# Patient Record
Sex: Female | Born: 1935
Health system: Southern US, Community
[De-identification: ages and names within clinical notes are randomized; demographics above are authoritative.]

## PROBLEM LIST (undated history)

## (undated) DIAGNOSIS — H579 Unspecified disorder of eye and adnexa: Secondary | ICD-10-CM

## (undated) DIAGNOSIS — Z8669 Personal history of other diseases of the nervous system and sense organs: Secondary | ICD-10-CM

## (undated) DIAGNOSIS — K279 Peptic ulcer, site unspecified, unspecified as acute or chronic, without hemorrhage or perforation: Secondary | ICD-10-CM

## (undated) DIAGNOSIS — H3530 Unspecified macular degeneration: Secondary | ICD-10-CM

## (undated) DIAGNOSIS — K219 Gastro-esophageal reflux disease without esophagitis: Secondary | ICD-10-CM

## (undated) DIAGNOSIS — G40909 Epilepsy, unspecified, not intractable, without status epilepticus: Principal | ICD-10-CM

## (undated) DIAGNOSIS — G2581 Restless legs syndrome: Secondary | ICD-10-CM

## (undated) DIAGNOSIS — E785 Hyperlipidemia, unspecified: Secondary | ICD-10-CM

## (undated) HISTORY — DX: Unspecified disorder of eye and adnexa: H57.9

## (undated) HISTORY — DX: Restless legs syndrome: G25.81

## (undated) HISTORY — DX: Personal history of other diseases of the nervous system and sense organs: Z86.69

## (undated) HISTORY — DX: Epilepsy, unspecified, not intractable, without status epilepticus: G40.909

## (undated) HISTORY — PX: CATARACT EXTRACTION: SUR2

## (undated) HISTORY — DX: Gastro-esophageal reflux disease without esophagitis: K21.9

## (undated) HISTORY — PX: TONSILLECTOMY: SHX5217

## (undated) HISTORY — DX: Hyperlipidemia, unspecified: E78.5

## (undated) HISTORY — DX: Peptic ulcer, site unspecified, unspecified as acute or chronic, without hemorrhage or perforation: K27.9

## (undated) HISTORY — PX: TUBAL LIGATION: SHX77

## (undated) HISTORY — DX: Unspecified macular degeneration: H35.30

---

## 1978-04-25 DIAGNOSIS — K279 Peptic ulcer, site unspecified, unspecified as acute or chronic, without hemorrhage or perforation: Secondary | ICD-10-CM

## 1978-04-25 HISTORY — DX: Peptic ulcer, site unspecified, unspecified as acute or chronic, without hemorrhage or perforation: K27.9

## 1995-04-26 DIAGNOSIS — G40909 Epilepsy, unspecified, not intractable, without status epilepticus: Secondary | ICD-10-CM

## 1995-04-26 HISTORY — DX: Epilepsy, unspecified, not intractable, without status epilepticus: G40.909

## 2000-07-10 ENCOUNTER — Encounter: Payer: Self-pay | Admitting: Internal Medicine

## 2000-07-10 ENCOUNTER — Encounter: Admission: RE | Admit: 2000-07-10 | Discharge: 2000-07-10 | Payer: Self-pay | Admitting: Internal Medicine

## 2001-04-03 ENCOUNTER — Encounter: Payer: Self-pay | Admitting: Internal Medicine

## 2001-04-03 ENCOUNTER — Encounter: Admission: RE | Admit: 2001-04-03 | Discharge: 2001-04-03 | Payer: Self-pay | Admitting: Internal Medicine

## 2001-05-14 ENCOUNTER — Emergency Department (HOSPITAL_COMMUNITY): Admission: EM | Admit: 2001-05-14 | Discharge: 2001-05-14 | Payer: Self-pay | Admitting: Emergency Medicine

## 2001-12-06 ENCOUNTER — Emergency Department (HOSPITAL_COMMUNITY): Admission: EM | Admit: 2001-12-06 | Discharge: 2001-12-06 | Payer: Self-pay | Admitting: Emergency Medicine

## 2002-11-22 ENCOUNTER — Ambulatory Visit (HOSPITAL_COMMUNITY): Admission: RE | Admit: 2002-11-22 | Discharge: 2002-11-22 | Payer: Self-pay | Admitting: Gastroenterology

## 2009-02-09 ENCOUNTER — Other Ambulatory Visit: Admission: RE | Admit: 2009-02-09 | Discharge: 2009-02-09 | Payer: Self-pay | Admitting: Family Medicine

## 2010-08-16 ENCOUNTER — Institutional Professional Consult (permissible substitution) (INDEPENDENT_AMBULATORY_CARE_PROVIDER_SITE_OTHER): Payer: Medicare Other | Admitting: Family Medicine

## 2010-08-16 DIAGNOSIS — E559 Vitamin D deficiency, unspecified: Secondary | ICD-10-CM

## 2010-08-16 DIAGNOSIS — G40909 Epilepsy, unspecified, not intractable, without status epilepticus: Secondary | ICD-10-CM

## 2010-08-16 DIAGNOSIS — Z79899 Other long term (current) drug therapy: Secondary | ICD-10-CM

## 2010-08-25 ENCOUNTER — Encounter: Payer: Self-pay | Admitting: Family Medicine

## 2010-08-25 DIAGNOSIS — H9319 Tinnitus, unspecified ear: Secondary | ICD-10-CM | POA: Insufficient documentation

## 2010-09-10 NOTE — Op Note (Signed)
   NAMEARLESIA, Alicia Alicia [name]                        ACCOUNT NO.:  [id number]   MEDICAL RECORD NO.:  [id number]                   PATIENT TYPE:  AMB   LOCATION:  ENDO                                 FACILITY:  [facility]   PHYSICIAN:  James L. Malon Kindle., M.D.          DATE OF BIRTH:  Apr 19, 1934   DATE OF PROCEDURE:  11/22/2002  DATE OF DISCHARGE:                                 OPERATIVE REPORT   PROCEDURE:  Colonoscopy.   MEDICATIONS:  Fentanyl 100 mcg, Versed 8 mg IV.   SCOPE:  Olympus pediatric colonoscope.   INDICATIONS FOR PROCEDURE:  Rectal bleeding.   DESCRIPTION OF PROCEDURE:  The procedure had been explained to the patient  and consent obtained. With the patient in the left lateral decubitus  position, the Olympus scope was inserted and advanced under direct  visualization. The prep was excellent. We were able to reach the cecum  without difficulty. The ileocecal valve and appendiceal orifice were seen.  The scope was withdrawn and the cecum, ascending colon, hepatic flexure,  transverse colon, splenic flexure, descending and sigmoid colon were seen  well. No polyps seen, no significant diverticular disease.  The scope was  withdrawn down in the rectum and on retroflexed with the finding of internal  hemorrhoids. The scope was withdrawn. The patient tolerated the procedure  well.   ASSESSMENT:  Rectal bleeding probably due to internal hemorrhoids.   PLAN:  Will give Alicia [name] hemorrhoid instruction sheet and will see back in the  office on an as needed basis.                                               James L. Malon Kindle., M.D.    Waldron Session  D:  11/22/2002  T:  11/22/2002  Job:  [id number]   cc:   Sharlet Salina, M.D.  [address]  [place]  [place] [address]  Fax: [phone]

## 2010-11-08 ENCOUNTER — Other Ambulatory Visit: Payer: Self-pay | Admitting: Family Medicine

## 2011-03-21 ENCOUNTER — Other Ambulatory Visit: Payer: Self-pay | Admitting: Family Medicine

## 2011-03-21 DIAGNOSIS — G40909 Epilepsy, unspecified, not intractable, without status epilepticus: Secondary | ICD-10-CM

## 2011-03-21 NOTE — Telephone Encounter (Signed)
This was in rx request. Unsure if it was ok to refill. Please let me know. Thanks.

## 2011-03-21 NOTE — Telephone Encounter (Signed)
done

## 2011-04-29 DIAGNOSIS — L608 Other nail disorders: Secondary | ICD-10-CM | POA: Diagnosis not present

## 2011-04-29 DIAGNOSIS — B351 Tinea unguium: Secondary | ICD-10-CM | POA: Diagnosis not present

## 2011-04-29 DIAGNOSIS — M2040 Other hammer toe(s) (acquired), unspecified foot: Secondary | ICD-10-CM | POA: Diagnosis not present

## 2011-04-29 DIAGNOSIS — M79609 Pain in unspecified limb: Secondary | ICD-10-CM | POA: Diagnosis not present

## 2011-05-06 DIAGNOSIS — M79609 Pain in unspecified limb: Secondary | ICD-10-CM | POA: Diagnosis not present

## 2011-06-20 ENCOUNTER — Other Ambulatory Visit: Payer: Self-pay | Admitting: Family Medicine

## 2011-06-20 NOTE — Telephone Encounter (Signed)
This was in refill requests, I pulled chart and it stated on last note that pt gets seizure meds from neuro. Please advise.

## 2011-06-20 NOTE — Telephone Encounter (Signed)
We have refilled this for her in the past, looks like last time was in July for 6 months.  She hasn't been seen since 07/2010, so refill enough to last until appt, and make sure med check is scheduled.

## 2011-07-25 ENCOUNTER — Encounter: Payer: Self-pay | Admitting: *Deleted

## 2011-07-25 ENCOUNTER — Ambulatory Visit (INDEPENDENT_AMBULATORY_CARE_PROVIDER_SITE_OTHER): Payer: Medicare Other | Admitting: Family Medicine

## 2011-07-25 ENCOUNTER — Encounter: Payer: Self-pay | Admitting: Family Medicine

## 2011-07-25 ENCOUNTER — Other Ambulatory Visit: Payer: Self-pay | Admitting: *Deleted

## 2011-07-25 VITALS — BP 138/70 | HR 76 | Ht 62.0 in | Wt 120.0 lb

## 2011-07-25 DIAGNOSIS — H9319 Tinnitus, unspecified ear: Secondary | ICD-10-CM

## 2011-07-25 DIAGNOSIS — G40909 Epilepsy, unspecified, not intractable, without status epilepticus: Secondary | ICD-10-CM

## 2011-07-25 DIAGNOSIS — L309 Dermatitis, unspecified: Secondary | ICD-10-CM

## 2011-07-25 DIAGNOSIS — Z79899 Other long term (current) drug therapy: Secondary | ICD-10-CM

## 2011-07-25 DIAGNOSIS — Z23 Encounter for immunization: Secondary | ICD-10-CM

## 2011-07-25 DIAGNOSIS — M810 Age-related osteoporosis without current pathological fracture: Secondary | ICD-10-CM

## 2011-07-25 DIAGNOSIS — L259 Unspecified contact dermatitis, unspecified cause: Secondary | ICD-10-CM

## 2011-07-25 DIAGNOSIS — E559 Vitamin D deficiency, unspecified: Secondary | ICD-10-CM

## 2011-07-25 DIAGNOSIS — R32 Unspecified urinary incontinence: Secondary | ICD-10-CM | POA: Diagnosis not present

## 2011-07-25 LAB — POCT URINALYSIS DIPSTICK
Bilirubin, UA: NEGATIVE
Ketones, UA: NEGATIVE
pH, UA: 7

## 2011-07-25 MED ORDER — HYDROCORTISONE VALERATE 0.2 % EX CREA: TOPICAL_CREAM | Freq: Two times a day (BID) | CUTANEOUS | Status: DC

## 2011-07-25 MED ORDER — LEVETIRACETAM 500 MG PO TABS: 500.0000 mg | ORAL_TABLET | Freq: Two times a day (BID) | ORAL | Status: DC

## 2011-07-25 MED ORDER — LAMOTRIGINE 100 MG PO TABS: 100.0000 mg | ORAL_TABLET | Freq: Every day | ORAL | Status: DC

## 2011-07-25 NOTE — Patient Instructions (Signed)
Continue all of your current medications.   Please call us if you change your mind about trying any medications to help strengthen your bones, to prevent complications of osteoporosis (ie hip fracture). Continue Calcium, Vitamin D and weight-bearing exercise

## 2011-07-25 NOTE — Progress Notes (Signed)
Patient presents for a medication check.  Last OV was a year ago.  She denies any concerns or complaints.  Seizure disorder: Last seizure was probably in 1997, well over 10 years ago.  Has been on the same regimen of medications with only mild/tolerable side effects.  Occasional pain behind both knees, relieved by theragesic ointment.  Sometimes she gets some numbness in the left leg, especially if sitting too long, and at night.  Sometimes gets low back pain if doing a lot of lifting or heavy housework  Wants refill on Westcort cream--has used periodically for skin rashes in the past, and wants refill, "likes to have it on hand". Denies any rash currently.  Osteoporosis--medications have been reviewed in detail in the past, but patient isn't interested in treatment, other than doing her exercises (works out at [employer]), and taking calcium and vitamin D.  Past Medical History  Diagnosis Date  . Osteoporosis     DEXA 03/2010 T-3.2 R hip; declines meds  . Vitamin d deficiency   . GERD (gastroesophageal reflux disease)   . Tinnitus 05/2009    DR BATES--related to change in med/generic.  Resolved  . Seizure disorder 1997    evaluated by [facility] in past  . PUD (peptic ulcer disease) 1980  . Hyperlipidemia   . Restless leg syndrome   . Hx of migraines     Past Surgical History  Procedure Date  . Tonsillectomy   . Tubal ligation     History   Social History  . Marital Status: Widowed    Spouse Name: N/A    Number of Children: 2  . Years of Education: N/A   Occupational History  . retired (from Community education officer)    Social History Main Topics  . Smoking status: Never Smoker   . Smokeless tobacco: Never Used  . Alcohol Use: No  . Drug Use: No  . Sexually Active: Not on file   Other Topics Concern  . Not on file   Social History Narrative   Recently moved to one level apartment.  Widowed 2009.  1 daughter in [place], 1 daughter in [place]    Family History  Problem Relation Age  of Onset  . Heart disease Mother   . Dementia Father   . Alzheimer's disease Sister   . Diabetes Neg Hx   . Cancer Neg Hx    Current Outpatient Prescriptions on File Prior to Visit  Medication Sig Dispense Refill  . Cholecalciferol (VITAMIN D) 2000 UNITS tablet Take 4,000 Units by mouth daily.       . diphenhydramine-acetaminophen (TYLENOL PM) 25-500 MG TABS Take 1 tablet by mouth at bedtime as needed. Takes most nights      . lamoTRIgine (LAMICTAL) 100 MG tablet Take 1 tablet (100 mg total) by mouth daily.  30 tablet  11  . levETIRAcetam (KEPPRA) 500 MG tablet Take 1 tablet (500 mg total) by mouth 2 (two) times daily.  60 tablet  11     Allergies  Allergen Reactions  . Sulfa Antibiotics Other (See Comments)    unknown   ROS: Denies fevers, URI symptoms, chest pain, shortness of breath, cough, bowel changes. +gradually worsening urinary leakage. Denies stress incontinence, just with urge.  Wears a pad when she goes out.  Having some urinary frequency.  Heartburn 2-3 times/week in the evenings, relieved by over-the-counter Pepcid.    PHYSICAL EXAM: BP 138/70  Pulse 76  Ht 5\' 2"  (1.575 m)  Wt 120 lb (54.432 kg)  BMI 21.95 kg/m2 Well developed, pleasant female, appears stated age, in no distress HEENT:  PERRL, EOMI, conjunctiva clear. OP normal Neck: no lymphadenopathy, thyromegaly or carotid bruit Heart: regular rate and rhythm without murmur Lungs: clear bilaterally Back: no spine or CVA tenderness Abdomen: soft, nontender, no organomegaly or mass Extremities: no clubbing, cyanosis or edema, 2+ pulses Skin: no rashes Neuro: alert and oriented x 3, cranial nerves grossly intact.  Normal strength, sensation, gait.  DTR's 2+ and symmetric Psych: normal mood, affect, hygiene and grooming  ASSESSENT/PLAN: 1. Seizure disorder  levETIRAcetam (KEPPRA) 500 MG tablet, lamoTRIgine (LAMICTAL) 100 MG tablet  2. Osteoporosis    3. Need for Tdap vaccination  Tdap vaccine greater than or  equal to 7yo IM  4. Tinnitus    5. Urinary incontinence  Urinalysis Dipstick  6. Encounter for long-term (current) use of other medications    7. Vitamin d deficiency    8. Dermatitis  hydrocortisone valerate cream (WESTCORT) 0.2 %   Discussed treatment for osteoporosis, and risks of untreated osteoporosis.  She is worried about potential side effects of meds, but not even willing to try them.  Continue calcium, Vitamin D and weight-bearing exercise  Pneumovax UTD (5/08 per [facility] chart) Give TdaP today  Patient left without getting labs drawn--she will return later in the week.

## 2011-07-27 ENCOUNTER — Other Ambulatory Visit: Payer: Medicare Other

## 2011-07-27 DIAGNOSIS — G40909 Epilepsy, unspecified, not intractable, without status epilepticus: Secondary | ICD-10-CM

## 2011-07-27 DIAGNOSIS — M810 Age-related osteoporosis without current pathological fracture: Secondary | ICD-10-CM | POA: Diagnosis not present

## 2011-07-27 DIAGNOSIS — Z79899 Other long term (current) drug therapy: Secondary | ICD-10-CM | POA: Diagnosis not present

## 2011-07-28 ENCOUNTER — Encounter: Payer: Self-pay | Admitting: Family Medicine

## 2011-07-28 LAB — COMPREHENSIVE METABOLIC PANEL
AST: 23 U/L (ref 0–37)
BUN: 13 mg/dL (ref 6–23)
Calcium: 9.6 mg/dL (ref 8.4–10.5)
Chloride: 100 mEq/L (ref 96–112)
Creat: 0.79 mg/dL (ref 0.50–1.10)
Total Bilirubin: 0.4 mg/dL (ref 0.3–1.2)

## 2011-07-28 LAB — CBC WITH DIFFERENTIAL/PLATELET
HCT: 38.7 % (ref 36.0–46.0)
Hemoglobin: 12.2 g/dL (ref 12.0–15.0)
Lymphocytes Relative: 22 % (ref 12–46)
MCV: 87.8 fL (ref 78.0–100.0)
Monocytes Absolute: 0.3 10*3/uL (ref 0.1–1.0)
Monocytes Relative: 5 % (ref 3–12)
Neutro Abs: 4.7 10*3/uL (ref 1.7–7.7)
WBC: 6.5 10*3/uL (ref 4.0–10.5)

## 2011-07-28 LAB — VITAMIN D 25 HYDROXY (VIT D DEFICIENCY, FRACTURES): Vit D, 25-Hydroxy: 59 ng/mL (ref 30–89)

## 2011-08-02 DIAGNOSIS — M79609 Pain in unspecified limb: Secondary | ICD-10-CM | POA: Diagnosis not present

## 2011-09-20 ENCOUNTER — Telehealth: Payer: Self-pay | Admitting: Internal Medicine

## 2011-09-21 ENCOUNTER — Other Ambulatory Visit: Payer: Self-pay | Admitting: *Deleted

## 2011-09-21 DIAGNOSIS — G40909 Epilepsy, unspecified, not intractable, without status epilepticus: Secondary | ICD-10-CM

## 2011-09-21 MED ORDER — LEVETIRACETAM 500 MG PO TABS: 500.0000 mg | ORAL_TABLET | Freq: Two times a day (BID) | ORAL | Status: DC

## 2011-09-21 NOTE — Telephone Encounter (Signed)
Changed rx that was called in 07/25/11 from #[id number] with 11 refills to #180 with 3 refills per pt request.

## 2011-09-22 NOTE — Telephone Encounter (Signed)
done

## 2011-10-25 DIAGNOSIS — L820 Inflamed seborrheic keratosis: Secondary | ICD-10-CM | POA: Diagnosis not present

## 2012-01-17 DIAGNOSIS — M79609 Pain in unspecified limb: Secondary | ICD-10-CM | POA: Diagnosis not present

## 2012-01-17 DIAGNOSIS — M2040 Other hammer toe(s) (acquired), unspecified foot: Secondary | ICD-10-CM | POA: Diagnosis not present

## 2012-03-01 ENCOUNTER — Ambulatory Visit (INDEPENDENT_AMBULATORY_CARE_PROVIDER_SITE_OTHER): Payer: Medicare Other | Admitting: Family Medicine

## 2012-03-01 ENCOUNTER — Encounter: Payer: Self-pay | Admitting: Family Medicine

## 2012-03-01 VITALS — BP 118/72 | HR 72 | Ht 62.0 in | Wt 119.0 lb

## 2012-03-01 DIAGNOSIS — M810 Age-related osteoporosis without current pathological fracture: Secondary | ICD-10-CM

## 2012-03-01 DIAGNOSIS — Z23 Encounter for immunization: Secondary | ICD-10-CM

## 2012-03-01 MED ORDER — INFLUENZA VIRUS VACC SPLIT PF IM SUSP: 0.5000 mL | Freq: Once | INTRAMUSCULAR | Status: DC

## 2012-03-01 NOTE — Progress Notes (Signed)
Chief Complaint  Patient presents with  . Leg Swelling    left leg swelling and pain.   HPI:  Patient presents with complaint of pain and swelling behind both knees, left worse than right.  She has had problems with pain like this since being on her current medications, but now has gotten worse.  Also complains of her feet feeling cold a lot.  She uses theragesic cream at night behind her knees, which helps some.  During the day she doesn't really have much pain.  Has some pain in low back.  And has some leg swelling, which is improved by wearing compression stockings during the day.  She has some pain related to hammertoes--followed by a podiatrist.  Complains of feet feeling cold.    She goes to [employer] twice a week. Denies any pain with exercise.  She notices the pain and discomfort when she lies down at night.  Has known osteoporosis, but declines any treatment.  She drinks 1 gallon of milk/week  Past Medical History  Diagnosis Date  . Osteoporosis     DEXA 03/2010 T-3.2 R hip; declines meds  . Vitamin D deficiency   . GERD (gastroesophageal reflux disease)   . Tinnitus 05/2009    DR BATES--related to change in med/generic.  Resolved  . Seizure disorder 1997    evaluated by [facility] in past  . PUD (peptic ulcer disease) 1980  . Hyperlipidemia   . Restless leg syndrome   . Hx of migraines    Past Surgical History  Procedure Date  . Tonsillectomy   . Tubal ligation    History   Social History  . Marital Status: Widowed    Spouse Name: N/A    Number of Children: 2  . Years of Education: N/A   Occupational History  . retired (from Community education officer)    Social History Main Topics  . Smoking status: Never Smoker   . Smokeless tobacco: Never Used  . Alcohol Use: No  . Drug Use: No  . Sexually Active: Not on file   Other Topics Concern  . Not on file   Social History Narrative   Recently moved to one level apartment.  Widowed 2009.  1 daughter in [place], 1 daughter in  [place]   Current outpatient prescriptions:Cholecalciferol (VITAMIN D) 2000 UNITS tablet, Take 2,000 Units by mouth daily. , Disp: , Rfl: ;  diphenhydramine-acetaminophen (TYLENOL PM) 25-500 MG TABS, Take 1 tablet by mouth at bedtime as needed. Takes most nights, Disp: , Rfl: ;  hydrocortisone valerate cream (WESTCORT) 0.2 %, Apply topically 2 (two) times daily. Apply twice daily to affected areas of skin as needed for itchy rash, Disp: 45 g, Rfl: 0 lamoTRIgine (LAMICTAL) 100 MG tablet, Take 1 tablet (100 mg total) by mouth daily., Disp: 30 tablet, Rfl: 11;  levETIRAcetam (KEPPRA) 500 MG tablet, Take 1 tablet (500 mg total) by mouth 2 (two) times daily., Disp: 180 tablet, Rfl: 3 Current facility-administered medications:influenza  inactive virus vaccine (FLUZONE/FLUARIX) injection 0.5 mL, 0.5 mL, Intramuscular, Once, [name], MD  Allergies  Allergen Reactions  . Sulfa Antibiotics Other (See Comments)    unknown   ROS:  Denies fevers, URI symptoms, chest pain, shortness of breath, headaches, dizziness.  Denies skin rashes.  +bruising (recently bumped into something with legs).  Denies any seizure activity, depression/anxiety.  PHYSICAL EXAM: BP 118/72  Pulse 72  Ht 5\' 2"  (1.575 m)  Wt 119 lb (53.978 kg)  BMI 21.77 kg/m2 Well developed, pleasant,  well-appearing female in no distress Extremities: slightly doughy feel to both lower legs, but no pitting edema. Fullness felt in left popliteal fossa that is not symmetric, not present on the right.  Nontender.  FROM without crepitus.  Normal knee exam  Mild hammertoes on right foot. 2+ pulses, brisk cap refill Some echymoses L anterior shin  ASSESSMENT/PLAN: 1. Need for prophylactic vaccination and inoculation against influenza  influenza  inactive virus vaccine (FLUZONE/FLUARIX) injection 0.5 mL  2. Osteoporosis     Leg pain--pain behind left knee is likely due to a small Baker's cyst.  She may also have leg cramps at night--encouraged to  drink plenty of fluids, and can try the white soap under the sheets trick (?). Try taking Aleve twice daily (okay to still take Tylenol PM if needed) for up to 10-14 days to see if that helps with the swelling behind the knee.  You may notice more bruising, but should resolve after stopping medication.  Do NOT take additional aspirin, motrin or pain relievers other than tylenol/acetaminophen while taking the Aleve.  If it bothers your stomach, then stop taking the Aleve.  Make sure to take the Aleve with food (breakfast and dinner)  reassurred regarding normal circulation in her feet.  Discussed calcium and vitamin D recommendations (not getting enough calcium).  Discussed maintaining bone (with calcium, D and exercise) versus building bone (with the therapies she declines).  Continue weight-bearing exercise.  Labs all normal 6 months ago, not repeating today  F/u in 6 months, sooner prn

## 2012-03-01 NOTE — Patient Instructions (Addendum)
Leg pain--pain behind left knee is likely due to a small Baker's cyst.  She may also have leg cramps at night--encouraged to drink plenty of fluids, and can try the white soap under the sheets trick (?). Try taking Aleve twice daily (okay to still take Tylenol PM if needed) for up to 10-14 days to see if that helps with the swelling behind the knee.  You may notice more bruising, but should resolve after stopping medication.  Do NOT take additional aspirin, motrin or pain relievers other than tylenol/acetaminophen while taking the Aleve.  If it bothers your stomach, then stop taking the Aleve.  Make sure to take the Aleve with food (breakfast and dinner)  Try and get 1200-1500 mg of Calcium from your diet and vitamins, to help maintain your bones and not lose any further bone density.  Baker's Cyst A Baker's cyst is a swelling that forms in the back of the knee. It is a sac-like structure. It is filled with the same fluid that is located in your knee. The fluid located in your knee is necessary because it lubricates the bones and cartilage. It allows them to move over each other more easily. CAUSES  When the knee becomes injured or has soreness (inflammation) present, more fluid forms in the knee. When this happens, the joint lining is pushed out behind the knee and forms the baker's cyst. This cyst may also be caused by inflammation from arthritic conditions and infections. DIAGNOSIS  A Baker's cyst is most often diagnosed with an ultrasound. This is a specialized picture (like an X-ray). It shows a picture by using sound waves. Sometimes a specialized x-ray called an MRI (magnetic resonance imaging) is used. This picks up other problems within a joint if an ultrasound alone cannot make the diagnosis. If the cyst came immediately following an injury, plain x-rays may be used to make a diagnosis. TREATMENT  The treatment depends on the cause of the cyst. But most of these cysts are caused by an  inflammation. Anti-inflammatory medications and rest often will get rid of the problem. If the cyst is caused by an infection, medications (antibiotics) will be prescribed to help this. Take the medications as directed. Refer to Home Care Instructions, below, for additional treatment suggestions. HOME CARE INSTRUCTIONS   If the cyst was caused by an injury, for the first 24 hours, while lying down, keep the injured extremity elevated on 2 pillows.  For the first 24 hours while you are awake, apply ice bags (ice in a plastic bag with a towel around it to prevent frostbite to skin) 3 to 4 times per day for 15 to 20 minutes to the injured area. Then do as directed by your caregiver.  Only take over-the-counter or prescription medicines for pain, discomfort, or fever as directed by your caregiver. Persistent pain and inability to use the injured area for more than 2 to 3 days are warning signs indicating that you should see a caregiver for a follow-up visit as soon as possible. Persistent pain and swelling indicate that further evaluation, non-weight bearing (use of crutches as instructed), and/or further x-rays are needed. Make a follow-up appointment with your own caregiver. If conservative measures (rest, medications and inactivity) do not help the problem get better, sometimes surgery for removal of the cyst is needed. Reasons for this may be that the cyst is pressing on nerves and/or vessels and causing problems which cannot wait for improvement with conservative treatment. If the problem is caused  by injuries to the cartilage in the knee, surgery is often needed for treatment of that problem. MAKE SURE YOU:   Understand these instructions.  Will watch your condition.  Will get help right away if you are not doing well or get worse. Document Released: 04/11/2005 Document Revised: 07/04/2011 Document Reviewed: 11/28/2007 [facility] Patient Information 2013 [place], [place].

## 2012-03-07 ENCOUNTER — Other Ambulatory Visit: Payer: Self-pay | Admitting: Family Medicine

## 2012-08-06 ENCOUNTER — Ambulatory Visit (INDEPENDENT_AMBULATORY_CARE_PROVIDER_SITE_OTHER): Payer: Medicare Other | Admitting: Family Medicine

## 2012-08-06 ENCOUNTER — Encounter: Payer: Self-pay | Admitting: Family Medicine

## 2012-08-06 VITALS — BP 130/86 | HR 84 | Ht 62.0 in | Wt 118.0 lb

## 2012-08-06 DIAGNOSIS — Z0000 Encounter for general adult medical examination without abnormal findings: Secondary | ICD-10-CM | POA: Diagnosis not present

## 2012-08-06 DIAGNOSIS — E559 Vitamin D deficiency, unspecified: Secondary | ICD-10-CM

## 2012-08-06 DIAGNOSIS — R300 Dysuria: Secondary | ICD-10-CM

## 2012-08-06 DIAGNOSIS — Z13220 Encounter for screening for lipoid disorders: Secondary | ICD-10-CM

## 2012-08-06 DIAGNOSIS — G40909 Epilepsy, unspecified, not intractable, without status epilepticus: Secondary | ICD-10-CM | POA: Diagnosis not present

## 2012-08-06 DIAGNOSIS — M810 Age-related osteoporosis without current pathological fracture: Secondary | ICD-10-CM

## 2012-08-06 DIAGNOSIS — Z01419 Encounter for gynecological examination (general) (routine) without abnormal findings: Secondary | ICD-10-CM | POA: Diagnosis not present

## 2012-08-06 DIAGNOSIS — Z79899 Other long term (current) drug therapy: Secondary | ICD-10-CM | POA: Diagnosis not present

## 2012-08-06 DIAGNOSIS — N390 Urinary tract infection, site not specified: Secondary | ICD-10-CM | POA: Diagnosis not present

## 2012-08-06 LAB — LIPID PANEL
Cholesterol: 203 mg/dL — ABNORMAL HIGH (ref 0–200)
Total CHOL/HDL Ratio: 2.3 Ratio

## 2012-08-06 LAB — POCT URINALYSIS DIPSTICK
Bilirubin, UA: NEGATIVE
Glucose, UA: NEGATIVE
Nitrite, UA: NEGATIVE

## 2012-08-06 LAB — COMPREHENSIVE METABOLIC PANEL
ALT: 10 U/L (ref 0–35)
AST: 16 U/L (ref 0–37)
CO2: 28 mEq/L (ref 19–32)
Calcium: 9.8 mg/dL (ref 8.4–10.5)
Chloride: 97 mEq/L (ref 96–112)
Potassium: 4.8 mEq/L (ref 3.5–5.3)
Sodium: 135 mEq/L (ref 135–145)
Total Protein: 7.3 g/dL (ref 6.0–8.3)

## 2012-08-06 MED ORDER — NITROFURANTOIN MONOHYD MACRO 100 MG PO CAPS: 100.0000 mg | ORAL_CAPSULE | Freq: Two times a day (BID) | ORAL | Status: DC

## 2012-08-06 MED ORDER — LAMOTRIGINE 100 MG PO TABS: 100.0000 mg | ORAL_TABLET | Freq: Every day | ORAL | Status: DC

## 2012-08-06 MED ORDER — LEVETIRACETAM 500 MG PO TABS: 500.0000 mg | ORAL_TABLET | Freq: Two times a day (BID) | ORAL | Status: DC

## 2012-08-06 NOTE — Progress Notes (Addendum)
Chief Complaint  Patient presents with  . Med check plus    fasting med check plus. Having some burning with urination and incontinence.   Patient is here for Med check, Annual Wellness Visit and forms to be DMV forms to be filled out.  She is complaining of burning with urination, at the end of void, for 7-10 days.  Incontinence is ongoing, somewhat worse in the last week.  No leakage with cough/sneeze, just constant dribbling, frequency.  Wears pads.  Seizure disorder: Last seizure was probably in 1998, when medications were stopped (diagnosed with seizures in 1997). Has been on the same regimen of medications with only mild/tolerable side effects. Had some problems with generic medications.  She reports her seizures were always while she was asleep, and last seizure was when she tried to come off meds.  She had eval by [facility], and last note is in old records in her chart, and was reviewed.  She has no desire to come off meds, or to take any meds that might interfere with her seizure medications, so that she can continue to drive.  Occasional pain behind both knees, last seen for this in November.  Not bothering her much, intermittent.  Osteoporosis--medications have been reviewed in detail in the past, but patient isn't interested in treatment, other than doing her exercises (works out at [employer]), and taking vitamin D.  She won't take any calcium supplements but "drinks a lot of milk".  AWV:  Other doctors caring for patient:   Sees a dentist (can't recall name) ophtho--wants a new one, doesn't remember name End of Life issues: she already has Living Will and Healthcare power of attorney. See Depression and ADL questionnaires (scanned)  Health Maintenance: Immunization History  Administered Date(s) Administered  . Influenza Split 03/01/2012  . Pneumococcal Polysaccharide 04/26/1999, 09/01/2006  . Td 09/01/2006  . Tdap 07/25/2011   Last Pap smear: >5 years ago Last mammogram:  2011 Last colonoscopy: thinks it has been 10 years. Per Dr. Milinda Pointer first note at [facility], said 2 years prior (so would have been approx 2006) Last DEXA: 03/2010 Ophtho: 2 years ago Dentist: once a year  Past Medical History  Diagnosis Date  . Osteoporosis     DEXA 03/2010 T-3.2 R hip; declines meds  . Vitamin D deficiency   . GERD (gastroesophageal reflux disease)   . Tinnitus 05/2009    DR BATES--related to change in med/generic.  Resolved  . Seizure disorder 1997    evaluated by [facility] in past  . PUD (peptic ulcer disease) 1980  . Hyperlipidemia   . Restless leg syndrome   . Hx of migraines     Past Surgical History  Procedure Laterality Date  . Tonsillectomy    . Tubal ligation      History   Social History  . Marital Status: Widowed    Spouse Name: N/A    Number of Children: 2  . Years of Education: N/A   Occupational History  . retired (from Community education officer)    Social History Main Topics  . Smoking status: Never Smoker   . Smokeless tobacco: Never Used  . Alcohol Use: No  . Drug Use: No  . Sexually Active: Not on file   Other Topics Concern  . Not on file   Social History Narrative   Recently moved to one level apartment.  Widowed 2009.  1 daughter in [place], 1 daughter in [place]    Family History  Problem Relation Age of Onset  .  Heart disease Mother   . Dementia Father   . Alzheimer's disease Sister   . Diabetes Neg Hx   . Cancer Neg Hx    Current outpatient prescriptions:Cholecalciferol (VITAMIN D) 2000 UNITS tablet, Take 2,000 Units by mouth daily. , Disp: , Rfl: ;  diphenhydramine-acetaminophen (TYLENOL PM) 25-500 MG TABS, Take 1 tablet by mouth at bedtime as needed. Takes most nights, Disp: , Rfl: ;  lamoTRIgine (LAMICTAL) 100 MG tablet, Take 1 tablet (100 mg total) by mouth daily., Disp: 30 tablet, Rfl: 11 levETIRAcetam (KEPPRA) 500 MG tablet, Take 1 tablet (500 mg total) by mouth 2 (two) times daily., Disp: 60 tablet, Rfl: 11;  hydrocortisone  valerate cream (WESTCORT) 0.2 %, APPLY TO AFFECTED AREA TWICE A DAY FOR ITCHY RASH, Disp: 45 g, Rfl: 0;  nitrofurantoin, macrocrystal-monohydrate, (MACROBID) 100 MG capsule, Take 1 capsule (100 mg total) by mouth 2 (two) times daily., Disp: 14 capsule, Rfl: 0  (macrobid just added today, not prior to today's visit)  Allergies  Allergen Reactions  . Sulfa Antibiotics Other (See Comments)    unknown   ROS:  The patient denies anorexia, fever, weight changes, headaches,  Seizures, vision changes, decreased hearing, ear pain, sore throat, breast concerns, chest pain, palpitations, dizziness, syncope, dyspnea on exertion, cough, swelling, nausea, vomiting, diarrhea, constipation, abdominal pain, melena, hematochezia, indigestion/heartburn, vaginal bleeding, discharge, odor or itch, genital lesions, numbness, tingling, weakness, tremor, suspicious skin lesions, depression, anxiety, abnormal bleeding/bruising, or enlarged lymph nodes. + urinary complaints as per HPI; occasional knee pain,  Some allergies recently/congestion  PHYSICAL EXAM: BP 130/86  Pulse 84  Ht 5\' 2"  (1.575 m)  Wt 118 lb (53.524 kg)  BMI 21.58 kg/m2  General Appearance:    Alert, cooperative, no distress, appears stated age  Head:    Normocephalic, without obvious abnormality, atraumatic  Eyes:    PERRL, conjunctiva/corneas clear, EOM's intact, fundi    benign  Ears:    Normal TM's and external ear canals  Nose:   Nares normal, mucosa normal, no drainage or sinus   tenderness  Throat:   Lips, mucosa, and tongue normal; teeth and gums normal  Neck:   Supple, no lymphadenopathy;  thyroid:  no   enlargement/tenderness/nodules; no carotid   bruit or JVD  Back:    Spine nontender, no curvature, ROM normal, no CVA     tenderness  Lungs:     Clear to auscultation bilaterally without wheezes, rales or     ronchi; respirations unlabored  Chest Wall:    No tenderness or deformity   Heart:    Regular rate and rhythm, S1 and S2 normal,  no murmur, rub   or gallop  Breast Exam:    No tenderness, masses, or nipple discharge or inversion.      No axillary lymphadenopathy  Abdomen:     Soft, non-tender, nondistended, normoactive bowel sounds,    no masses, no hepatosplenomegaly  Genitalia:    Normal external genitalia without lesions.  BUS and vagina normal; she is tender anteriorly over her bladder on pelvic exam.  No prolapse.  Also slightly tender at urethra. No uterine enlargement, adnexal masses or tenderness  Rectal:    Normal tone, no masses or tenderness; guaiac negative stool  Extremities:   No clubbing, cyanosis or edema  Pulses:   2+ and symmetric all extremities  Skin:   Skin color, texture, turgor normal, no rashes or lesions  Lymph nodes:   Cervical, supraclavicular, and axillary nodes normal  Neurologic:  CNII-XII intact, normal strength, sensation and gait; reflexes 2+ and symmetric throughout          Psych:   Normal mood, affect, hygiene and grooming.    ASSESSMENT/PLAN:  Seizure disorder - Plan: lamoTRIgine (LAMICTAL) 100 MG tablet, levETIRAcetam (KEPPRA) 500 MG tablet  Urinary tract infection, site not specified - treat with macrobid.  urine culture sent.  call/return if symptoms persist/worsen - Plan: Urine culture, nitrofurantoin, macrocrystal-monohydrate, (MACROBID) 100 MG capsule  Osteoporosis - pt refuses treatment. Risks of untreated osteoporosis reviewed.  reviewed dietary calcium sources (refuses pills); continue Vitamin D  Vitamin D deficiency  Burning with urination - Plan: POCT Urinalysis Dipstick  Encounter for long-term (current) use of other medications - Plan: CBC with Differential, Comprehensive metabolic panel, Lipid panel, TSH  Screening for lipoid disorders - Plan: Lipid panel  Encounter for Medicare annual wellness exam  Seizure disorder--stable.  DMV forms filled out, okay to drive.  Continue current medications.  Osteoporosis--no need to recheck DEXA if unwilling to  treat.  Check with [facility] to see if they did last colonoscopy, and when it was, to see if due now (recommended every 10 years, may have been 2006 per old records, but no reports in chart)  Willing to schedule mammogram (last had 2011), encouraged for her to have yearly.  Shingles vaccine recommended and discussed; needs to get at pharmacy.  AWV:  Reviewed benefits/procedures covered by [facility] copy of form given to patient (scanned).  She has living will and POA.

## 2012-08-06 NOTE — Patient Instructions (Addendum)
HEALTH MAINTENANCE RECOMMENDATIONS:  It is recommended that you get at least 30 minutes of aerobic exercise at least 5 days/week (for weight loss, you may need as much as 60-90 minutes). This can be any activity that gets your heart rate up. This can be divided in 10-15 minute intervals if needed, but try and build up your endurance at least once a week.  Weight bearing exercise is also recommended twice weekly.  Eat a healthy diet with lots of vegetables, fruits and fiber.  "Colorful" foods have a lot of vitamins (ie green vegetables, tomatoes, red peppers, etc).  Limit sweet tea, regular sodas and alcoholic beverages, all of which has a lot of calories and sugar.  Up to 1 alcoholic drink daily may be beneficial for women (unless trying to lose weight, watch sugars).  Drink a lot of water.  Calcium recommendations are 1200-1500 mg daily (1500 mg for postmenopausal women or women without ovaries), and vitamin D 1000 IU daily.  This should be obtained from diet and/or supplements (vitamins), and calcium should not be taken all at once, but in divided doses.  Monthly self breast exams and yearly mammograms for women over the age of 62 is recommended.  Sunscreen of at least SPF 30 should be used on all sun-exposed parts of the skin when outside between the hours of 10 am and 4 pm (not just when at beach or pool, but even with exercise, golf, tennis, and yard work!)  Use a sunscreen that says "broad spectrum" so it covers both UVA and UVB rays, and make sure to reapply every 1-2 hours.  Remember to change the batteries in your smoke detectors when changing your clock times in the spring and fall.  Use your seat belt every time you are in a car, and please drive safely and not be distracted with cell phones and texting while driving.  Check with [facility] to see if they did last colonoscopy, and when it was, to see if due now (recommended every 10 years)  Return in fall for flu shot. I recommend  Shingles vaccine--check with your insurance regarding cost/coverage. You will need to get this from a pharmacy (not my office).  Take antibiotics as prescribed for your bladder infection.  Call us in 3-4 days if your symptoms aren't improving.

## 2012-08-07 LAB — CBC WITH DIFFERENTIAL/PLATELET
Basophils Absolute: 0 10*3/uL (ref 0.0–0.1)
Lymphocytes Relative: 18 % (ref 12–46)
Lymphs Abs: 1.6 10*3/uL (ref 0.7–4.0)
MCV: 83.5 fL (ref 78.0–100.0)
Neutro Abs: 6.4 10*3/uL (ref 1.7–7.7)
Neutrophils Relative %: 73 % (ref 43–77)
Platelets: 243 10*3/uL (ref 150–400)
RBC: 4.79 MIL/uL (ref 3.87–5.11)
RDW: 14.6 % (ref 11.5–15.5)
WBC: 8.7 10*3/uL (ref 4.0–10.5)

## 2012-08-08 LAB — URINE CULTURE: Colony Count: >=100000

## 2012-08-08 LAB — TSH: TSH: 0.85 u[IU]/mL (ref 0.350–4.500)

## 2012-08-14 ENCOUNTER — Telehealth: Payer: Self-pay | Admitting: Family Medicine

## 2012-08-14 NOTE — Telephone Encounter (Signed)
Pt called and stated that dmv stated forms had not been received. Pt was informed that out records indicate forms faxed 08/06/2012. Pt requested that forms be fax to a different fax number. Forms were faxed to [phone] per pt instructions.

## 2012-08-27 DIAGNOSIS — H2510 Age-related nuclear cataract, unspecified eye: Secondary | ICD-10-CM | POA: Diagnosis not present

## 2012-08-27 DIAGNOSIS — H353190 Nonexudative age-related macular degeneration, unspecified eye, stage unspecified: Secondary | ICD-10-CM | POA: Diagnosis not present

## 2012-10-19 DIAGNOSIS — M48061 Spinal stenosis, lumbar region without neurogenic claudication: Secondary | ICD-10-CM | POA: Diagnosis not present

## 2012-11-19 DIAGNOSIS — M778 Other enthesopathies, not elsewhere classified: Secondary | ICD-10-CM | POA: Diagnosis not present

## 2012-12-26 DIAGNOSIS — H43399 Other vitreous opacities, unspecified eye: Secondary | ICD-10-CM | POA: Diagnosis not present

## 2012-12-26 DIAGNOSIS — H353190 Nonexudative age-related macular degeneration, unspecified eye, stage unspecified: Secondary | ICD-10-CM | POA: Diagnosis not present

## 2012-12-26 DIAGNOSIS — H04129 Dry eye syndrome of unspecified lacrimal gland: Secondary | ICD-10-CM | POA: Diagnosis not present

## 2012-12-26 DIAGNOSIS — H2510 Age-related nuclear cataract, unspecified eye: Secondary | ICD-10-CM | POA: Diagnosis not present

## 2012-12-26 DIAGNOSIS — H04209 Unspecified epiphora, unspecified lacrimal gland: Secondary | ICD-10-CM | POA: Diagnosis not present

## 2013-01-01 ENCOUNTER — Other Ambulatory Visit: Payer: Self-pay | Admitting: Family Medicine

## 2013-01-01 NOTE — Telephone Encounter (Signed)
Chart reviewed.  Last discussed 07/2011, last refilled 02/2012.  Uses prn rashes.  Only due for yearly visits, has appt 07/2013.  Ok to refill

## 2013-01-01 NOTE — Telephone Encounter (Signed)
IS THIS OK 

## 2013-01-03 ENCOUNTER — Encounter: Payer: Self-pay | Admitting: *Deleted

## 2013-01-15 DIAGNOSIS — Z23 Encounter for immunization: Secondary | ICD-10-CM | POA: Diagnosis not present

## 2013-04-03 DIAGNOSIS — H04129 Dry eye syndrome of unspecified lacrimal gland: Secondary | ICD-10-CM | POA: Diagnosis not present

## 2013-04-03 DIAGNOSIS — H40019 Open angle with borderline findings, low risk, unspecified eye: Secondary | ICD-10-CM | POA: Diagnosis not present

## 2013-05-30 ENCOUNTER — Other Ambulatory Visit: Payer: Self-pay | Admitting: *Deleted

## 2013-05-30 ENCOUNTER — Telehealth: Payer: Self-pay | Admitting: Family Medicine

## 2013-05-30 DIAGNOSIS — G40909 Epilepsy, unspecified, not intractable, without status epilepticus: Secondary | ICD-10-CM

## 2013-05-30 MED ORDER — LAMOTRIGINE 100 MG PO TABS: 100.0000 mg | ORAL_TABLET | Freq: Every day | ORAL | Status: DC

## 2013-05-30 NOTE — Telephone Encounter (Signed)
Done

## 2013-05-30 NOTE — Telephone Encounter (Signed)
She was given #30 x 11 refills at her last visit in April.  Looks like she is asking for a 90 day rx.  Okay for 90 day.  Has visit scheduled for April already

## 2013-05-31 NOTE — Telephone Encounter (Signed)
done

## 2013-07-22 ENCOUNTER — Other Ambulatory Visit: Payer: Self-pay | Admitting: Family Medicine

## 2013-07-24 ENCOUNTER — Ambulatory Visit (INDEPENDENT_AMBULATORY_CARE_PROVIDER_SITE_OTHER): Payer: Medicare Other | Admitting: Family Medicine

## 2013-07-24 ENCOUNTER — Encounter: Payer: Self-pay | Admitting: Family Medicine

## 2013-07-24 VITALS — BP 152/90 | HR 72 | Ht 62.0 in | Wt 118.0 lb

## 2013-07-24 DIAGNOSIS — K120 Recurrent oral aphthae: Secondary | ICD-10-CM

## 2013-07-24 DIAGNOSIS — G40909 Epilepsy, unspecified, not intractable, without status epilepticus: Secondary | ICD-10-CM

## 2013-07-24 DIAGNOSIS — K130 Diseases of lips: Secondary | ICD-10-CM | POA: Diagnosis not present

## 2013-07-24 DIAGNOSIS — R220 Localized swelling, mass and lump, head: Secondary | ICD-10-CM

## 2013-07-24 MED ORDER — LAMOTRIGINE 100 MG PO TABS: 100.0000 mg | ORAL_TABLET | Freq: Every day | ORAL | Status: DC

## 2013-07-24 NOTE — Progress Notes (Signed)
Chief Complaint  Patient presents with  . Edema    swelling of her lips and mouth since Sunday. Had a new 90 day rx sent to pharmacy of her lamictal-started a few weeks ago and had some ringing in her right ear. Stopped these pills and took some that were left over from old rx and ringing stopped. Started back on new rx this past Sat and swelling of mouth began. This manufacturer is mylan and the old rx as as well.    Patient presents with complaint of lip and mouth pain, concerned that it is related to her lamictal prescription.  Ringing in right ear recurred when she started the last bottle of lamictal, which [facility], but picked up a 90 day supply.  She previously had the same reaction when taking a generic from a different manufacturer in the past, and when she switched back to the brand, symptoms had resolved.  She had some lamictal leftover that she restarted, and ringing went away.  On Saturday she had to go back to the 90d bottle as she used up the old supply.  She didn't have any recurrent ringing in the ear since restarting, but she noticed swelling of her lips and gums the following day (3 days ago).    She used to take brand only of Lamictal, due to having had the side effect from generic in the past.  She changed to generic when she was able to get it for free through her insurance, and it also comes from Mylan (same as the brand).  She has been on this same generic (Mylan) for a number of years.  [facility] She was only offered a refill of the same medicine from the same lot--apparently they only order it for her  She denies eating any different foods. No change in toothpaste, mouthwash, cleansers or other products. She used a clinique lip gloss that night that was new.  She has used clinique products in the past, but not this particular lip gloss. The gums have gotten worse--they are hurting, sore.  Only the lips (upper and lower)  are affected, as well as the gums.  Denies tongue swelling, throat swelling no trouble swallowing or breathing.  She has been using salt water rinses.  Past Medical History  Diagnosis Date  . Osteoporosis     DEXA 03/2010 T-3.2 R hip; declines meds  . Vitamin D deficiency   . GERD (gastroesophageal reflux disease)   . Tinnitus 05/2009    DR BATES--related to change in med/generic.  Resolved  . Seizure disorder 1997    evaluated by [facility] in past  . PUD (peptic ulcer disease) 1980  . Hyperlipidemia   . Restless leg syndrome   . Hx of migraines   . Macular degeneration    Past Surgical History  Procedure Laterality Date  . Tonsillectomy    . Tubal ligation     History   Social History  . Marital Status: Widowed    Spouse Name: N/A    Number of Children: 2  . Years of Education: N/A   Occupational History  . retired (from Community education officer)    Social History Main Topics  . Smoking status: Never Smoker   . Smokeless tobacco: Never Used  . Alcohol Use: No  . Drug Use: No  . Sexual Activity: Not on file   Other Topics Concern  . Not on file   Social  History Narrative   Recently moved to one level apartment.  Widowed 2009.  1 daughter in [place], 1 daughter in [place]   Outpatient Encounter Prescriptions as of 07/24/2013  Medication Sig  . Cholecalciferol (VITAMIN D) 2000 UNITS tablet Take 2,000 Units by mouth daily.   . diphenhydramine-acetaminophen (TYLENOL PM) 25-500 MG TABS Take 1 tablet by mouth at bedtime as needed. Takes most nights  . lamoTRIgine (LAMICTAL) 100 MG tablet Take 1 tablet (100 mg total) by mouth daily.  Marland Kitchen. levETIRAcetam (KEPPRA) 500 MG tablet TAKE 1 TABLET BY MOUTH TWICE A DAY *NDC [id number]*  . [DISCONTINUED] lamoTRIgine (LAMICTAL) 100 MG tablet Take 1 tablet (100 mg total) by mouth daily.  Marland Kitchen. acetaminophen (TYLENOL) 325 MG tablet Take 325 mg by mouth every 6 (six) hours as needed.  . hydrocortisone valerate cream (WESTCORT) 0.2 % APPLY TO AFFECTED AREA TWICE A  DAY FOR ITCHY RASH  . [DISCONTINUED] nitrofurantoin, macrocrystal-monohydrate, (MACROBID) 100 MG capsule Take 1 capsule (100 mg total) by mouth 2 (two) times daily.   Allergies  Allergen Reactions  . Sulfa Antibiotics Other (See Comments)    unknown   ROS:  Denies fevers, chills, URI symptoms, headaches, seizures, nausea, vomiting, skin rash, bleeding/bruising or other concerns. Denies allergy symptoms, cough, shortness of breath, chest pain  PHYSICAL EXAM: BP 152/90  Pulse 72  Ht 5\' 2"  (1.575 m)  Wt 118 lb (53.524 kg)  BMI 21.58 kg/m2 Somewhat anxious female, otherwise in no distress HEENT:  PERRL, EOMI, conjunctiva clear. Lips appear normal, without any significant swelling, flaking, rash or lesions.  OP is notable for a few scattered aphthous ulcers in upper and lower gums, more on right than left (total of 3 sores seen).  Tongue appears normal, and rest of OP is normal Neck: no lymphadenopathy, thyromegaly or mass Heart: regular rate and rhythm without murmur Lungs: clear bilaterally Skin: no rash or lesions  ASSESSMENT/PLAN:  Aphthous ulcer - oral  Seizure disorder - Plan: lamoTRIgine (LAMICTAL) 100 MG tablet  Lip swelling - mild.  had used new lip gloss, suspect contact reaction   We discussed that her lip gloss may have caused a contact dermatitis contributing to lip swelling.  Do not use the Clinique lip gloss again.  Use some claritin or zyrtec for the swelling. Use anbesol (she has at home) for the painful ulcers.  Declined magic mouthwash at this time--call if needed  I doubt that it is related to the Lamictal, as there really wasn't a change (just a different lot, same manufacturer).  She prefers to try and get Mylan lamictal from another pharmacy to try, aware that she will likely have to pay out of pocket.  Once she finishes the 2 weeks, she will retry the bottle that she has (of 90) and see if symptoms recur.

## 2013-07-24 NOTE — Patient Instructions (Signed)
Canker Sores  Canker sores are painful, open sores on the inside of the mouth and cheek. They may be white or yellow. The sores usually heal in 1 to 2 weeks. Women are more likely than men to have recurrent canker sores. CAUSES The cause of canker sores is not well understood. More than one cause is likely. Canker sores do not appear to be caused by certain types of germs (viruses or bacteria). Canker sores may be caused by:  An allergic reaction to certain foods.  Digestive problems.  Not having enough vitamin B12, folic acid, and iron.  Female sex hormones. Sores may come only during certain phases of a menstrual cycle. Often, there is improvement during pregnancy.  Genetics. Some people seem to inherit canker sore problems. Emotional stress and injuries to the mouth may trigger outbreaks, but not cause them.  DIAGNOSIS Canker sores are diagnosed by exam.  TREATMENT  Patients who have frequent bouts of canker sores may have cultures taken of the sores, blood tests, or allergy tests. This helps determine if their sores are caused by a poor diet, an allergy, or some other preventable or treatable disease.  Vitamins may prevent recurrences or reduce the severity of canker sores in people with poor nutrition.  Numbing ointments can relieve pain. These are available in drug stores without a prescription.  Anti-inflammatory steroid mouth rinses or gels may be prescribed by your caregiver for severe sores.  Oral steroids may be prescribed if you have severe, recurrent canker sores. These strong medicines can cause many side effects and should be used only under the close direction of a dentist or physician.  Mouth rinses containing the antibiotic medicine may be prescribed. They may lessen symptoms and speed healing. Healing usually happens in about 1 or 2 weeks with or without treatment. Certain antibiotic mouth rinses given to pregnant women and young children can permanently stain teeth.  Talk to your caregiver about your treatment. HOME CARE INSTRUCTIONS   Avoid foods that cause canker sores for you.  Avoid citrus juices, spicy or salty foods, and coffee until the sores are healed.  Use a soft-bristled toothbrush.  Chew your food carefully to avoid biting your cheek.  Apply topical numbing medicine to the sore to help relieve pain.  Apply a thin paste of baking soda and water to the sore to help heal the sore.  Only use mouth rinses or medicines for pain or discomfort as directed by your caregiver. SEEK MEDICAL CARE IF:   Your symptoms are not better in 1 week.  Your sores are still present after 2 weeks.  Your sores are very painful.  You have trouble breathing or swallowing.  Your sores come back frequently. Document Released: 08/06/2010 Document Revised: 08/06/2012 Document Reviewed: 08/06/2010 [facility] Patient Information 2014 [place], [place].   Take claritin or zyrtec to help with the lip swelling. Do not use the lip gloss again. Use Anbesol to the sores in your mouth. Call for prescription for Magic Mouthwash if your sores are causing more pain.  Go to the emergency room if you develop tongue or throat swelling, or any shortness of breath.  I doubt it is from the medication, but to be on the safe side, you can change to the brand for a month, and then retry your current prescription.  If you have recurrent symptoms then you will need to stay on the brand.  If you have ongoing symptoms despite being on the brand, then you might be developing an  allergy to the medication, and need to see neurologist to have medication changed

## 2013-08-07 ENCOUNTER — Ambulatory Visit (INDEPENDENT_AMBULATORY_CARE_PROVIDER_SITE_OTHER): Payer: Medicare Other | Admitting: Family Medicine

## 2013-08-07 ENCOUNTER — Encounter: Payer: Self-pay | Admitting: Family Medicine

## 2013-08-07 VITALS — BP 148/88 | HR 80 | Temp 97.9°F | Ht 62.0 in | Wt 115.0 lb

## 2013-08-07 DIAGNOSIS — M810 Age-related osteoporosis without current pathological fracture: Secondary | ICD-10-CM | POA: Diagnosis not present

## 2013-08-07 DIAGNOSIS — E559 Vitamin D deficiency, unspecified: Secondary | ICD-10-CM

## 2013-08-07 DIAGNOSIS — Z79899 Other long term (current) drug therapy: Secondary | ICD-10-CM | POA: Diagnosis not present

## 2013-08-07 DIAGNOSIS — K130 Diseases of lips: Secondary | ICD-10-CM | POA: Diagnosis not present

## 2013-08-07 DIAGNOSIS — R030 Elevated blood-pressure reading, without diagnosis of hypertension: Secondary | ICD-10-CM

## 2013-08-07 DIAGNOSIS — Z0000 Encounter for general adult medical examination without abnormal findings: Secondary | ICD-10-CM | POA: Diagnosis not present

## 2013-08-07 DIAGNOSIS — R220 Localized swelling, mass and lump, head: Secondary | ICD-10-CM

## 2013-08-07 DIAGNOSIS — K120 Recurrent oral aphthae: Secondary | ICD-10-CM

## 2013-08-07 DIAGNOSIS — IMO0001 Reserved for inherently not codable concepts without codable children: Secondary | ICD-10-CM

## 2013-08-07 DIAGNOSIS — G40909 Epilepsy, unspecified, not intractable, without status epilepticus: Secondary | ICD-10-CM

## 2013-08-07 LAB — CBC WITH DIFFERENTIAL/PLATELET
Basophils Absolute: 0.1 10*3/uL (ref 0.0–0.1)
Basophils Relative: 1 % (ref 0–1)
Eosinophils Absolute: 0 10*3/uL (ref 0.0–0.7)
Eosinophils Relative: 0 % (ref 0–5)
HCT: 40.5 % (ref 36.0–46.0)
HEMOGLOBIN: 13.7 g/dL (ref 12.0–15.0)
LYMPHS ABS: 1.8 10*3/uL (ref 0.7–4.0)
LYMPHS PCT: 17 % (ref 12–46)
MCH: 27.6 pg (ref 26.0–34.0)
MCHC: 33.8 g/dL (ref 30.0–36.0)
MCV: 81.7 fL (ref 78.0–100.0)
MONOS PCT: 5 % (ref 3–12)
Monocytes Absolute: 0.5 10*3/uL (ref 0.1–1.0)
NEUTROS ABS: 8 10*3/uL — AB (ref 1.7–7.7)
NEUTROS PCT: 77 % (ref 43–77)
Platelets: 229 10*3/uL (ref 150–400)
RBC: 4.96 MIL/uL (ref 3.87–5.11)
RDW: 15.1 % (ref 11.5–15.5)
WBC: 10.4 10*3/uL (ref 4.0–10.5)

## 2013-08-07 LAB — COMPREHENSIVE METABOLIC PANEL
ALBUMIN: 4.6 g/dL (ref 3.5–5.2)
ALT: 11 U/L (ref 0–35)
AST: 20 U/L (ref 0–37)
Alkaline Phosphatase: 30 U/L — ABNORMAL LOW (ref 39–117)
BUN: 8 mg/dL (ref 6–23)
CALCIUM: 9.6 mg/dL (ref 8.4–10.5)
CHLORIDE: 94 meq/L — AB (ref 96–112)
CO2: 28 mEq/L (ref 19–32)
Creat: 0.73 mg/dL (ref 0.50–1.10)
GLUCOSE: 98 mg/dL (ref 70–99)
POTASSIUM: 4.8 meq/L (ref 3.5–5.3)
Sodium: 131 mEq/L — ABNORMAL LOW (ref 135–145)
Total Bilirubin: 0.4 mg/dL (ref 0.2–1.2)
Total Protein: 7.2 g/dL (ref 6.0–8.3)

## 2013-08-07 LAB — TSH: TSH: 0.92 u[IU]/mL (ref 0.350–4.500)

## 2013-08-07 NOTE — Patient Instructions (Addendum)
  HEALTH MAINTENANCE RECOMMENDATIONS:  It is recommended that you get at least 30 minutes of aerobic exercise at least 5 days/week (for weight loss, you may need as much as 60-90 minutes). This can be any activity that gets your heart rate up. This can be divided in 10-15 minute intervals if needed, but try and build up your endurance at least once a week.  Weight bearing exercise is also recommended twice weekly.  Eat a healthy diet with lots of vegetables, fruits and fiber.  "Colorful" foods have a lot of vitamins (ie green vegetables, tomatoes, red peppers, etc).  Limit sweet tea, regular sodas and alcoholic beverages, all of which has a lot of calories and sugar.  Up to 1 alcoholic drink daily may be beneficial for women (unless trying to lose weight, watch sugars).  Drink a lot of water.  Calcium recommendations are 1200-1500 mg daily (1500 mg for postmenopausal women or women without ovaries), and vitamin D 1000 IU daily.  This should be obtained from diet and/or supplements (vitamins), and calcium should not be taken all at once, but in divided doses.  Monthly self breast exams and yearly mammograms for women over the age of 78 is recommended.  Sunscreen of at least SPF 30 should be used on all sun-exposed parts of the skin when outside between the hours of 10 am and 4 pm (not just when at beach or pool, but even with exercise, golf, tennis, and yard work!)  Use a sunscreen that says "broad spectrum" so it covers both UVA and UVB rays, and make sure to reapply every 1-2 hours.  Remember to change the batteries in your smoke detectors when changing your clock times in the spring and fall.  Use your seat belt every time you are in a car, and please drive safely and not be distracted with cell phones and texting while driving.  Please schedule your mammogram.  Please check your blood pressure elsewhere to make sure that it is not regularly running high.  Normal is <140/90, ideally  <130/80.  Prevnar 13 was recommended (declined) Consider Zostavax (shingles vaccine)--declined; would need to get from pharmacy if desired (not in our office)

## 2013-08-07 NOTE — Progress Notes (Signed)
Chief Complaint  Patient presents with  . Med check plus    fasting med check plus/AWV(if time permits) with pelvic(patient would rather not do pelvic exam). Patient states that she is not doing any better since last visit. Did go and get brand name Lamictal, only got 5 as they were expensive-was doing great. Went back to the generic and swelling of mouth happened all over again. Went back on the brand name(went and got rest of rx) and still just doesn't like it is back to normal-unsure of what to do at this point.     Alicia [name] is a 80 y.o. female who presents for her Annual Wellness visit, and routine follow-up on her chronic conditions.  She has the following concerns:  She continues to have some leakage of urine, needing to wear pads constantly.  She feels like her muscles are weak.  She mostly notices the leakage when she has the urge to urinate, not with cough/sneeze. Denies dysuria, urinary urgency/frequency.  She complains of ongoing problems with ringing in her ear, and lip swelling and mouth sores, related to generic lamictal. She took 5 tablets of the branded Lamictal, and she felt better, and the Monday after Easter she restarted the generic from the bottle of 90 that she had from the other pharmacy.  The mouth sores recurred, as did a lot of noise in the ears.  She then went and got the last 10 of the prescription of branded Lamictal.  She has improved some, but still has some swelling of her lips, sores in the mouth, lips being very red.  The noise in the ear has been less since back on the brand, but not entirely gone.  She would like to go back to [facility] (where she previously was seen, when having problems with the ringing in her ears from her meds) rather than local neuro--she thinks that is what her daughters would prefer. [facility]. She is very nervous about having to go back to the generic medication, given how terrible she feels when she takes it.  Seizure  disorder: Last seizure was probably in 1998, when medications were stopped (diagnosed with seizures in 1997). Has been on the same regimen of medications with only mild/tolerable side effects. Had some problems with generic medications (ringing in ears) although recently had been doing okay with generic from [facility], until it was filled for a 90 day supply. She reports her seizures were always while she was asleep, and last seizure was when she tried to come off meds. She had eval by [facility], and last note is in old records in her chart, and was reviewed. She has no desire to come off meds, or to take any meds that might interfere with her seizure medications, so that she can continue to drive.   Osteoporosis--medications have been reviewed in detail in the past, but patient isn't interested in treatment, other than doing her exercises (works out at [employer]), and taking vitamin D. She won't take any calcium supplements but "drinks a lot of milk".    AWV: Other doctors caring for patient:  Dentist:  Dr. Derrill KayGoodman ophtho--Dr. Georganna SkeansPainter [facility] Dr. Daneen Schickodney Radtke  End of Life issues: she already has Living Will and Healthcare power of attorney.  See Depression and ADL questionnaires (scanned) --notable only for ringing in ears, urinary incontinence (wears pads) and feeling slowed/low in energy  Health Maintenance:  Immunization History  Administered Date(s) Administered  .  Influenza Split 03/01/2012  . Influenza-Unspecified 12/24/2012  . Pneumococcal Polysaccharide-23 04/26/1999, 09/01/2006  . Td 09/01/2006  . Tdap 07/25/2011    Last Pap smear: years ago (01/2009 per computer?); declines today Last mammogram: 2011  Last colonoscopy: [facility] Mil[facility]ulsemann[facility], said 2 years prior (so would have been approx 2006)  Last DEXA: 03/2010 (shows osteoporosis, but pt refuses treatment) Ophtho: has appointment scheduled in May, goes yearly Dentist: once a year, last seen  in February Exercise:  [facility], does [employer] classes (includes weights).  Sometimes she will also do the bike at the gym.  No exercise outside of going to the gym Lipids normal 07/2012  Lab Results  Component Value Date   CHOL 203* 08/06/2012   HDL 88 08/06/2012   LDLCALC 101* 08/06/2012   TRIG 70 08/06/2012   CHOLHDL 2.3 08/06/2012   Normal vitamin D in 2013, takes daily supplement  Past Medical History  Diagnosis Date  . Osteoporosis     DEXA 03/2010 T-3.2 R hip; declines meds  . Vitamin D deficiency   . GERD (gastroesophageal reflux disease)   . Tinnitus 05/2009    DR BATES--related to change in med/generic.  Resolved  . Seizure disorder 1997    evaluated by [facility] in past  . PUD (peptic ulcer disease) 1980  . Hyperlipidemia   . Restless leg syndrome   . Hx of migraines   . Macular degeneration     Past Surgical History  Procedure Laterality Date  . Tonsillectomy    . Tubal ligation      History   Social History  . Marital Status: Widowed    Spouse Name: N/A    Number of Children: 2  . Years of Education: N/A   Occupational History  . retired (from [employer])    Social History Main Topics  . Smoking status: Never Smoker   . Smokeless tobacco: Never Used  . Alcohol Use: No  . Drug Use: No  . Sexual Activity: Not Currently   Other Topics Concern  . Not on file   Social History Narrative   Recently moved to one level apartment.  Widowed 2009.  1 daughter in [place], 1 daughter in [place]    Family History  Problem Relation Age of Onset  . Heart disease Mother   . Dementia Father   . Alzheimer's disease Sister   . Diabetes Neg Hx   . Cancer Neg Hx    Outpatient Encounter Prescriptions as of 08/07/2013  Medication Sig  . acetaminophen (TYLENOL) 325 MG tablet Take 325 mg by mouth every 6 (six) hours as needed.  . Cholecalciferol (VITAMIN D) 2000 UNITS tablet Take 2,000 Units by mouth daily.   . diphenhydramine-acetaminophen  (TYLENOL PM) 25-500 MG TABS Take 1 tablet by mouth at bedtime as needed. Takes most nights  . lamoTRIgine (LAMICTAL) 100 MG tablet Take 1 tablet (100 mg total) by mouth daily.  Marland Kitchen. levETIRAcetam (KEPPRA) 500 MG tablet TAKE 1 TABLET BY MOUTH TWICE A DAY *NDC [id number]*  . hydrocortisone valerate cream (WESTCORT) 0.2 % APPLY TO AFFECTED AREA TWICE A DAY FOR ITCHY RASH   Allergies  Allergen Reactions  . Sulfa Antibiotics Other (See Comments)    unknown   ROS: The patient denies anorexia, fever, weight changes, headaches, Seizures, vision changes, ear pain, sore throat, breast concerns, chest pain, palpitations, dizziness, syncope, dyspnea on exertion, cough, swelling, nausea, vomiting, diarrhea, constipation, abdominal pain, melena, hematochezia, indigestion/heartburn (infrequent),  vaginal bleeding, discharge, odor or itch, genital lesions, numbness, tingling, weakness, tremor, suspicious skin lesions, depression, abnormal bleeding/bruising, or enlarged lymph nodes.  + urinary complaints as per HPI Mild sniffling from allergies. Mouth sores, ringing in ears She feels anxious when her mouth is swollen and she isn't feeling well related to her medication side effects (recent)   PHYSICAL EXAM: BP 148/88  Pulse 80  Temp(Src) 97.9 F (36.6 C) (Oral)  Ht 5\' 2"  (1.575 m)  Wt 115 lb (52.164 kg)  BMI 21.03 kg/m2  154/88 on repeat by MD. Pt appears anxious, perseverating somewhat about her reactions to her medication  General Appearance:  Alert, cooperative, no distress, appears stated age   Head:  Normocephalic, without obvious abnormality, atraumatic   Eyes:  PERRL, conjunctiva/corneas clear, EOM's intact, fundi  benign   Ears:  Normal TM's and external ear canals   Nose:  Nares normal, mucosa normal, no drainage or sinus tenderness   Throat:  Tongue and teeth are normal.  She has mild swelling of the upper lip.  She has mild erythema of the mucosa on upper lip.  Small aphthous ulcer  anteriorly at lower jaw  Neck:  Supple, no lymphadenopathy; thyroid: no enlargement/tenderness/nodules; no carotid  bruit or JVD   Back:  Spine nontender, no curvature, ROM normal, no CVA tenderness   Lungs:  Clear to auscultation bilaterally without wheezes, rales or ronchi; respirations unlabored   Chest Wall:  No tenderness or deformity   Heart:  Regular rate and rhythm, S1 and S2 normal, no murmur, rub  or gallop   Breast Exam:  No tenderness, masses, or nipple discharge or inversion. No axillary lymphadenopathy   Abdomen:  Soft, non-tender, nondistended, normoactive bowel sounds,  no masses, no hepatosplenomegaly   Genitalia:  Declined exam today  Rectal:  Patient declined exam today  Extremities:  No clubbing, cyanosis or edema   Pulses:  2+ and symmetric all extremities   Skin:  Skin color, texture, turgor normal, no rashes or lesions   Lymph nodes:  Cervical, supraclavicular, and axillary nodes normal   Neurologic:  CNII-XII intact, normal strength, sensation and gait; reflexes 2+ and symmetric throughout          Psych: Normal mood, affect, hygiene and grooming.    ASSESSMENT/PLAN:  Vitamin D deficiency  Seizure disorder  Encounter for long-term (current) use of other medications - Plan: Comprehensive metabolic panel, CBC with Differential, TSH  Lip swelling - Plan: TSH  Medicare annual wellness visit, subsequent  Osteoporosis, unspecified  Aphthous ulcer  Elevated blood pressure - likely related to anxiety.  Check it elsewhere to ensure it isn't continuing to run high  Osteoporosis   Refer back to Dr. Quintin Alto at [facility] neuro.  She is hoping to be seen as soon as possible.  In the interim, she would like to remain on the BRAND Lamictal.  She has 3-4 pills left.  She will likely need rx to last until appt--and likely will need prior auth started.  $178 for 15 day supply  Prevnar 13 is recommended--she declines Briefly reviewed/recommended Zostavax--she  declines  Schedule mammograms yearly  She declines any medications to treat her urinary incontinence  Osteoporosis - pt refuses treatment. Risks of untreated osteoporosis reviewed. reviewed dietary calcium sources (refuses pills); continue Vitamin D   Discussed monthly self breast exams and yearly mammograms after the age of 37; at least 30 minutes of aerobic activity at least 5 days/week; proper sunscreen use reviewed; healthy diet, including goals of  calcium and vitamin D intake and alcohol recommendations (less than or equal to 1 drink/day) reviewed; regular seatbelt use; changing batteries in smoke detectors.  Immunization recommendations discussed (see above).  Colonoscopy recommendations reviewed

## 2013-08-08 ENCOUNTER — Other Ambulatory Visit: Payer: Self-pay | Admitting: *Deleted

## 2013-08-08 DIAGNOSIS — G40219 Localization-related (focal) (partial) symptomatic epilepsy and epileptic syndromes with complex partial seizures, intractable, without status epilepticus: Secondary | ICD-10-CM | POA: Diagnosis not present

## 2013-08-08 DIAGNOSIS — E871 Hypo-osmolality and hyponatremia: Secondary | ICD-10-CM

## 2013-08-08 DIAGNOSIS — K1370 Unspecified lesions of oral mucosa: Secondary | ICD-10-CM | POA: Diagnosis not present

## 2013-08-11 ENCOUNTER — Emergency Department (HOSPITAL_COMMUNITY)
Admission: EM | Admit: 2013-08-11 | Discharge: 2013-08-12 | Disposition: A | Discharge status: Home / Self Care | Payer: Medicare Other | Attending: Emergency Medicine | Admitting: Emergency Medicine

## 2013-08-11 ENCOUNTER — Encounter (HOSPITAL_COMMUNITY): Payer: Self-pay | Admitting: Emergency Medicine

## 2013-08-11 DIAGNOSIS — E785 Hyperlipidemia, unspecified: Secondary | ICD-10-CM | POA: Diagnosis not present

## 2013-08-11 DIAGNOSIS — K120 Recurrent oral aphthae: Secondary | ICD-10-CM | POA: Insufficient documentation

## 2013-08-11 DIAGNOSIS — K1370 Unspecified lesions of oral mucosa: Secondary | ICD-10-CM | POA: Diagnosis not present

## 2013-08-11 DIAGNOSIS — Z79899 Other long term (current) drug therapy: Secondary | ICD-10-CM | POA: Diagnosis not present

## 2013-08-11 DIAGNOSIS — Z8719 Personal history of other diseases of the digestive system: Secondary | ICD-10-CM | POA: Insufficient documentation

## 2013-08-11 DIAGNOSIS — E559 Vitamin D deficiency, unspecified: Secondary | ICD-10-CM | POA: Diagnosis not present

## 2013-08-11 DIAGNOSIS — G40909 Epilepsy, unspecified, not intractable, without status epilepticus: Secondary | ICD-10-CM | POA: Insufficient documentation

## 2013-08-11 DIAGNOSIS — G2581 Restless legs syndrome: Secondary | ICD-10-CM | POA: Diagnosis not present

## 2013-08-11 DIAGNOSIS — Z8739 Personal history of other diseases of the musculoskeletal system and connective tissue: Secondary | ICD-10-CM | POA: Insufficient documentation

## 2013-08-11 DIAGNOSIS — G43909 Migraine, unspecified, not intractable, without status migrainosus: Secondary | ICD-10-CM | POA: Insufficient documentation

## 2013-08-11 DIAGNOSIS — Z8711 Personal history of peptic ulcer disease: Secondary | ICD-10-CM | POA: Insufficient documentation

## 2013-08-11 NOTE — ED Notes (Addendum)
Pt states that she was seen at her primary for similar symptoms 2 days ago. Pt believes that she is having a reaction to her seizure medication. Pt was taken off the medication switched to a generic and then placed back on the name brand medication (keppra and lamital) pt started having swelling around her lips and felt like her throat is swollen as well. Pt states dry mouth and told not to drink any more water, but states she cannot swallow without drinking more water. Pt is able to talk in complete sentences and able to maintain airway, but just feels tightness and dry  mouth

## 2013-08-12 LAB — CBC WITH DIFFERENTIAL/PLATELET
BASOS ABS: 0 10*3/uL (ref 0.0–0.1)
Basophils Relative: 0 % (ref 0–1)
Eosinophils Absolute: 0.2 10*3/uL (ref 0.0–0.7)
Eosinophils Relative: 3 % (ref 0–5)
HEMATOCRIT: 39.3 % (ref 36.0–46.0)
HEMOGLOBIN: 13.2 g/dL (ref 12.0–15.0)
LYMPHS ABS: 2.3 10*3/uL (ref 0.7–4.0)
LYMPHS PCT: 34 % (ref 12–46)
MCH: 28.7 pg (ref 26.0–34.0)
MCHC: 33.6 g/dL (ref 30.0–36.0)
MCV: 85.4 fL (ref 78.0–100.0)
MONO ABS: 0.6 10*3/uL (ref 0.1–1.0)
Monocytes Relative: 9 % (ref 3–12)
NEUTROS ABS: 3.6 10*3/uL (ref 1.7–7.7)
Neutrophils Relative %: 54 % (ref 43–77)
Platelets: 193 10*3/uL (ref 150–400)
RBC: 4.6 MIL/uL (ref 3.87–5.11)
RDW: 14 % (ref 11.5–15.5)
WBC: 6.6 10*3/uL (ref 4.0–10.5)

## 2013-08-12 LAB — BASIC METABOLIC PANEL
BUN: 8 mg/dL (ref 6–23)
CHLORIDE: 97 meq/L (ref 96–112)
CO2: 29 meq/L (ref 19–32)
Calcium: 9.6 mg/dL (ref 8.4–10.5)
Creatinine, Ser: 0.62 mg/dL (ref 0.50–1.10)
GFR, EST NON AFRICAN AMERICAN: 85 mL/min — AB (ref >90)
Glucose, Bld: 110 mg/dL — ABNORMAL HIGH (ref 70–99)
Potassium: 4.6 mEq/L (ref 3.7–5.3)
Sodium: 138 mEq/L (ref 137–147)

## 2013-08-12 MED ORDER — DEXAMETHASONE 10 MG/ML FOR PEDIATRIC ORAL USE
10.0000 mg | Freq: Once | INTRAMUSCULAR | Status: AC
  Administered 2013-08-12: 10 mg via ORAL
  Filled 2013-08-12: qty 1

## 2013-08-12 NOTE — ED Notes (Signed)
Pt and pt's family very upset about the long wait time to see a provider, this RN tried to reassure pt and family. Charge RN made aware and spoke with family also.

## 2013-08-12 NOTE — ED Notes (Signed)
Pt c/o tongue swelling and redness. No swelling or redness noted. Pt's respirations are equal and unlabored

## 2013-08-12 NOTE — ED Provider Notes (Signed)
CSN: [id number]     Arrival date & time 08/11/13  2045 History   First MD Initiated Contact with Patient 08/12/13 0049     Chief Complaint  Patient presents with  . Oral Swelling     (Consider location/radiation/quality/duration/timing/severity/associated sxs/prior Treatment) The history is provided by the patient.   80 year old female comes in with problems with oral swelling and aphthous ulcers. Her problems started about a month ago when she ran out of her prior prescription of lamotrigine and had a new prescription filled which was generic. She took the generic medication developed tinnitus and also some ulcers on her lip and gum. She saw her PCP who put her back on the brand name lamotrigine and the tinnitus resolved but the ulcers did not completely go away. She went back on the generic and had worsening of the ulcers but did not have recurrence of tinnitus. She eventually did see her neurologist 3 days ago who put her back on the brand name lamotrigine. She continues to have a painful ulcer on the left side of her mouth and is complaining of some swelling of her lips and tongue. She states that she is having difficulty swallowing because her mouth is so dry. Of note, she states that she has been seizure-free since 1997. She denies fever or chills.  Past Medical History  Diagnosis Date  . Osteoporosis     DEXA 03/2010 T-3.2 R hip; declines meds  . Vitamin D deficiency   . GERD (gastroesophageal reflux disease)   . Tinnitus 05/2009    DR BATES--related to change in med/generic.  Resolved  . Seizure disorder 1997    evaluated by [facility] in past  . PUD (peptic ulcer disease) 1980  . Hyperlipidemia   . Restless leg syndrome   . Hx of migraines   . Macular degeneration   . Seizures    Past Surgical History  Procedure Laterality Date  . Tonsillectomy    . Tubal ligation     Family History  Problem Relation Age of Onset  . Heart disease Mother   . Dementia Father   . Alzheimer's  disease Sister   . Diabetes Neg Hx   . Cancer Neg Hx    History  Substance Use Topics  . Smoking status: Never Smoker   . Smokeless tobacco: Never Used  . Alcohol Use: No   OB History   Grav Para Term Preterm Abortions TAB SAB Ect Mult Living                 Review of Systems  All other systems reviewed and are negative.     Allergies  Sulfa antibiotics  Home Medications   Prior to Admission medications   Medication Sig Start Date End Date Taking? Authorizing Provider  acetaminophen (TYLENOL) 325 MG tablet Take 325 mg by mouth every 6 (six) hours as needed.    Historical Provider, MD  Cholecalciferol (VITAMIN D) 2000 UNITS tablet Take 2,000 Units by mouth daily.     Historical Provider, MD  diphenhydramine-acetaminophen (TYLENOL PM) 25-500 MG TABS Take 1 tablet by mouth at bedtime as needed. Takes most nights    Historical Provider, MD  hydrocortisone valerate cream (WESTCORT) 0.2 % APPLY TO AFFECTED AREA TWICE A DAY FOR ITCHY RASH 01/01/13   [name], MD  lamoTRIgine (LAMICTAL) 100 MG tablet Take 1 tablet (100 mg total) by mouth daily. 07/24/13   [name], MD  levETIRAcetam (KEPPRA) 500 MG tablet TAKE 1 TABLET BY MOUTH TWICE  A DAY *NDC [name]*    Eve Knapp, MD   BP 178/73  Pulse 80  Temp(Src) 97.7 F (36.5 C) (Oral)  Resp 17  Ht 5\' 2"  (1.575 m)  Wt 114 lb (51.71 kg)  BMI 20.85 kg/m2  SpO2 98% Physical Exam  Nursing note and vitals reviewed.  80 year old female, resting comfortably and in no acute distress. Vital signs are significant for hypertension with blood pressure 178/73. Oxygen saturation is 98%, which is normal. Head is normocephalic and atraumatic. PERRLA, EOMI. Oropharynx is clear. There is perhaps minimal swelling of the lower lip. There is an aphthous ulcer on the left buccal mucosa. There is no edema of the tongue sublingual tissues or pharynx. Neck is nontender and supple without adenopathy or JVD. Back is nontender and there is no CVA  tenderness. Lungs are clear without rales, wheezes, or rhonchi. Chest is nontender. Heart has regular rate and rhythm without murmur. Abdomen is soft, flat, nontender without masses or hepatosplenomegaly and peristalsis is normoactive. Extremities have no cyanosis or edema, full range of motion is present. Skin is warm and dry without rash. Neurologic: Mental status is normal, cranial nerves are intact, there are no motor or sensory deficits.  ED Course  Procedures (including critical care time) Labs Review Results for orders placed during the hospital encounter of 08/11/13  CBC WITH DIFFERENTIAL      Result Value Ref Range   WBC 6.6  4.0 - 10.5 K/uL   RBC 4.60  3.87 - 5.11 MIL/uL   Hemoglobin 13.2  12.0 - 15.0 g/dL   HCT 24.239.3  68.336.0 - 41.946.0 %   MCV 85.4  78.0 - 100.0 fL   MCH 28.7  26.0 - 34.0 pg   MCHC 33.6  30.0 - 36.0 g/dL   RDW 62.214.0  29.711.5 - 98.915.5 %   Platelets 193  150 - 400 K/uL   Neutrophils Relative % 54  43 - 77 %   Neutro Abs 3.6  1.7 - 7.7 K/uL   Lymphocytes Relative 34  12 - 46 %   Lymphs Abs 2.3  0.7 - 4.0 K/uL   Monocytes Relative 9  3 - 12 %   Monocytes Absolute 0.6  0.1 - 1.0 K/uL   Eosinophils Relative 3  0 - 5 %   Eosinophils Absolute 0.2  0.0 - 0.7 K/uL   Basophils Relative 0  0 - 1 %   Basophils Absolute 0.0  0.0 - 0.1 K/uL  BASIC METABOLIC PANEL      Result Value Ref Range   Sodium 138  137 - 147 mEq/L   Potassium 4.6  3.7 - 5.3 mEq/L   Chloride 97  96 - 112 mEq/L   CO2 29  19 - 32 mEq/L   Glucose, Bld 110 (*) 70 - 99 mg/dL   BUN 8  6 - 23 mg/dL   Creatinine, Ser 2.110.62  0.50 - 1.10 mg/dL   Calcium 9.6  8.4 - 94.110.5 mg/dL   GFR calc non Af Amer 85 (*) >90 mL/min   GFR calc Af Amer >90  >90 mL/min   MDM   Final diagnoses:  Oral aphthous ulcer  Seizure disorder    Aphthous ulcer it has been present for several weeks. I've reviewed her office records which corroborate what she has given me and her history. Also, she had incidental finding of  hyponatremia 4 days ago. Sodium is repeated and is back to normal. I am not sure if the original  value was actually a lab her mother was true but she does not need any treatment for that. WBC is normal without left shift. In the ED, she is not having any difficulty with secretions. She was offered IV fluids to make sure that she is adequately hydrated but she has declined this. She is empirically given a dose of dexamethasone to see if this helps give her some symptomatic relief. I recommended that she discuss with her neurologist whether she should stay on lamotrigine or whether she could consider coming off of it since she has been seizure-free for an extended period of time. Alternatively, perhaps her neurologist could consider another medication to take its place.    Dione Boozeavid Renita Brocks, MD 08/12/13 [phone]

## 2013-08-12 NOTE — ED Notes (Signed)
Pt. Family member came to the desk wanted to know when the MD would come in the room to see her mother.  At the same time of Drenda FreezeFran PA walk into the room, she asked me if she was a MD.  I told her that she was the PA and she stated that she refused for her mother to see a PA. I tried to tell her that Drenda FreezeFran was one of our best and that if it was my mother that I would choose her to treat her. She stated all of her concerns and I told her that Drenda FreezeFran would go back and speak to MD.

## 2013-08-12 NOTE — Discharge Instructions (Signed)
Call your neurologist for advice regarding whether you should stay on Lamictal.  Drink plenty of fluids. Return to the ED if you are having any problems.   Canker Sores  Canker sores are painful, open sores on the inside of the mouth and cheek. They may be white or yellow. The sores usually heal in 1 to 2 weeks. Women are more likely than men to have recurrent canker sores. CAUSES The cause of canker sores is not well understood. More than one cause is likely. Canker sores do not appear to be caused by certain types of germs (viruses or bacteria). Canker sores may be caused by:  An allergic reaction to certain foods.  Digestive problems.  Not having enough vitamin B12, folic acid, and iron.  Female sex hormones. Sores may come only during certain phases of a menstrual cycle. Often, there is improvement during pregnancy.  Genetics. Some people seem to inherit canker sore problems. Emotional stress and injuries to the mouth may trigger outbreaks, but not cause them.  DIAGNOSIS Canker sores are diagnosed by exam.  TREATMENT  Patients who have frequent bouts of canker sores may have cultures taken of the sores, blood tests, or allergy tests. This helps determine if their sores are caused by a poor diet, an allergy, or some other preventable or treatable disease.  Vitamins may prevent recurrences or reduce the severity of canker sores in people with poor nutrition.  Numbing ointments can relieve pain. These are available in drug stores without a prescription.  Anti-inflammatory steroid mouth rinses or gels may be prescribed by your caregiver for severe sores.  Oral steroids may be prescribed if you have severe, recurrent canker sores. These strong medicines can cause many side effects and should be used only under the close direction of a dentist or physician.  Mouth rinses containing the antibiotic medicine may be prescribed. They may lessen symptoms and speed healing. Healing usually  happens in about 1 or 2 weeks with or without treatment. Certain antibiotic mouth rinses given to pregnant women and young children can permanently stain teeth. Talk to your caregiver about your treatment. HOME CARE INSTRUCTIONS   Avoid foods that cause canker sores for you.  Avoid citrus juices, spicy or salty foods, and coffee until the sores are healed.  Use a soft-bristled toothbrush.  Chew your food carefully to avoid biting your cheek.  Apply topical numbing medicine to the sore to help relieve pain.  Apply a thin paste of baking soda and water to the sore to help heal the sore.  Only use mouth rinses or medicines for pain or discomfort as directed by your caregiver. SEEK MEDICAL CARE IF:   Your symptoms are not better in 1 week.  Your sores are still present after 2 weeks.  Your sores are very painful.  You have trouble breathing or swallowing.  Your sores come back frequently. Document Released: 08/06/2010 Document Revised: 08/06/2012 Document Reviewed: 08/06/2010 [facility] Patient Information 2014 [employer] [place].

## 2013-08-12 NOTE — ED Notes (Signed)
Pt ambulating independently w/ steady gait on d/c in no acute distress, A&Ox4.D/c instructions reviewed w/ pt and family - pt and family deny any further questions or concerns at present.  

## 2013-08-20 ENCOUNTER — Ambulatory Visit (INDEPENDENT_AMBULATORY_CARE_PROVIDER_SITE_OTHER): Payer: Medicare Other | Admitting: Family Medicine

## 2013-08-20 ENCOUNTER — Encounter: Payer: Self-pay | Admitting: Family Medicine

## 2013-08-20 VITALS — BP 134/90 | HR 80 | Wt 116.0 lb

## 2013-08-20 DIAGNOSIS — K1379 Other lesions of oral mucosa: Secondary | ICD-10-CM

## 2013-08-20 DIAGNOSIS — K1370 Unspecified lesions of oral mucosa: Secondary | ICD-10-CM | POA: Diagnosis not present

## 2013-08-20 MED ORDER — MAGIC MOUTHWASH W/LIDOCAINE: 5.0000 mL | Freq: Three times a day (TID) | ORAL | Status: DC | PRN

## 2013-08-20 NOTE — Progress Notes (Signed)
   Subjective:    Patient ID: Bary LericheShirley A Fulbright, female    DOB: 05/17/1933, 80 y.o.   MRN: [id number]  HPI She is here for evaluation of continued difficulty with mouth ulcers. She noted this after she got a refill on one of her generic medications. She thought it was related. She was seen by Dr. Lynelle DoctorKnapp and placed back on brand name product and in spite of this the symptoms continued. She subsequently was seen in the emergency room and [facility]. The Lamictal has been discontinued however she continues on Keppra  Review of Systems     Objective:   Physical Exam Alert and in no distress. Thorough evaluation of her oral mucosa shows no erythema or ulcerations. She did have some whitish deposits however there are KOH negative.       Assessment & Plan:  Mouth pain - Plan: Alum & Mag Hydroxide-Simeth (MAGIC MOUTHWASH W/LIDOCAINE) SOLN  I reassured her that I did not think they mouth symptoms she was having are related to the medication she is on. Did recommend she try Magic mouthwash to see if this would help. There was no evidence of angioedema.

## 2013-08-26 ENCOUNTER — Other Ambulatory Visit: Payer: Self-pay | Admitting: Family Medicine

## 2013-08-26 DIAGNOSIS — G40909 Epilepsy, unspecified, not intractable, without status epilepticus: Secondary | ICD-10-CM

## 2013-08-26 NOTE — Telephone Encounter (Signed)
done

## 2013-08-26 NOTE — Telephone Encounter (Signed)
Is this okay to refill? 

## 2013-09-02 DIAGNOSIS — H40009 Preglaucoma, unspecified, unspecified eye: Secondary | ICD-10-CM | POA: Diagnosis not present

## 2013-09-02 DIAGNOSIS — H353190 Nonexudative age-related macular degeneration, unspecified eye, stage unspecified: Secondary | ICD-10-CM | POA: Diagnosis not present

## 2013-09-02 DIAGNOSIS — H2510 Age-related nuclear cataract, unspecified eye: Secondary | ICD-10-CM | POA: Diagnosis not present

## 2013-09-11 ENCOUNTER — Other Ambulatory Visit: Payer: Medicare Other

## 2013-09-11 DIAGNOSIS — E871 Hypo-osmolality and hyponatremia: Secondary | ICD-10-CM

## 2013-09-11 LAB — BASIC METABOLIC PANEL
BUN: 12 mg/dL (ref 6–23)
CO2: 27 mEq/L (ref 19–32)
Calcium: 9.2 mg/dL (ref 8.4–10.5)
Chloride: 97 mEq/L (ref 96–112)
Creat: 0.68 mg/dL (ref 0.50–1.10)
Glucose, Bld: 91 mg/dL (ref 70–99)
Potassium: 4.5 mEq/L (ref 3.5–5.3)
Sodium: 134 mEq/L — ABNORMAL LOW (ref 135–145)

## 2013-09-17 ENCOUNTER — Telehealth: Payer: Self-pay | Admitting: Family Medicine

## 2013-09-18 NOTE — Telephone Encounter (Signed)
Patient advised.

## 2013-09-18 NOTE — Telephone Encounter (Signed)
Advise pt that her sodium was barely out of normal range. In general, not drinking too much water is the only recommendation (but drink enough so she avoids dehydration!).  There really isn't anything in particular she needs to do.  I am NOT worried about her sodium, it has been slightly low just very sporadically, normal most often.

## 2013-10-21 ENCOUNTER — Other Ambulatory Visit: Payer: Self-pay | Admitting: Family Medicine

## 2013-10-21 NOTE — Telephone Encounter (Signed)
Ok to refill once

## 2013-10-21 NOTE — Telephone Encounter (Signed)
IS THIS OKAY 

## 2013-10-30 ENCOUNTER — Ambulatory Visit (INDEPENDENT_AMBULATORY_CARE_PROVIDER_SITE_OTHER): Payer: Medicare Other | Admitting: Family Medicine

## 2013-10-30 ENCOUNTER — Encounter: Payer: Self-pay | Admitting: Family Medicine

## 2013-10-30 VITALS — BP 122/78 | HR 76 | Temp 97.6°F | Ht 62.0 in | Wt 114.0 lb

## 2013-10-30 DIAGNOSIS — K1379 Other lesions of oral mucosa: Secondary | ICD-10-CM

## 2013-10-30 DIAGNOSIS — K1370 Unspecified lesions of oral mucosa: Secondary | ICD-10-CM | POA: Diagnosis not present

## 2013-10-30 DIAGNOSIS — E871 Hypo-osmolality and hyponatremia: Secondary | ICD-10-CM

## 2013-10-30 DIAGNOSIS — J309 Allergic rhinitis, unspecified: Secondary | ICD-10-CM

## 2013-10-30 NOTE — Progress Notes (Signed)
Chief Complaint  Patient presents with  . Oral Swelling    started with lip swelling x 2 weeks ago. Her tongue was coated(yellow) also had sores in her throat(blisters)-has gotten a little bit better. Is having trouble with eating and swallowing. Is concerned about her low sodium (was 134 April 2015)and wonders if tis could be a symptom of low sodium. Also mentions that she is incontinent and thinks maybe this is why her sodium runs slightly low.    She stopped the Lamictal a couple of months ago, and mouth symptoms improved--lips still stayed sensitive but gums had improved.  She was fine, up until about 2 weeks ago, where she had recurrent symptoms.  She reports that her tongue was coated and sore, lips were swollen.  It hurt to swallow and to eat.  She called and got the viscous lidocaine refilled.  This helped with the pain.  The swelling went down, but the "skin on the lips seem so thin".  She has persistent sore throat, and lips feel sensitive, but overall the pain has improved.  She hasn't needed to use the lidocaine for the last 3-4 days.  She likes eating tomatoes, but she cut back when the pain started.   Her appetite is poor, "I'm pretty much living off of Ensure"--mainly due to decreased appetite, and some sore throat.  Daughter looked at her throat last night and said it was "red and yellow". She reports weight loss.  Weight is unchanged from April, down a few pounds overall since start of mouth problems.  No known fevers, ear pain, runny nose, cough. She does have some allergy symptoms--nasal congestion in the mornings and some postnasal drainage/throat-clearing.  Denies shortness of breath, chest pain.  Denies bleeding, bruising, rashes.  She is having urinary incontinence--urge incontinence, and she has to go frequently.  She wears a pad due to leakage.  No aware of any stress incontinence.  Denies dysuria, blood in the urine.  Leakage is ongoing x years, slightly worse through the  years, but not significant.  She has continued to make sure that she isn't drinking too much water (she cut back some after first being told of the low sodium (131) back in April.    She has many questions regarding hyponatremia, symptoms, etc.  Past Medical History  Diagnosis Date  . Osteoporosis     DEXA 03/2010 T-3.2 R hip; declines meds  . Vitamin D deficiency   . GERD (gastroesophageal reflux disease)   . Tinnitus 05/2009    DR BATES--related to change in med/generic.  Resolved  . Seizure disorder 1997    evaluated by [facility] in past  . PUD (peptic ulcer disease) 1980  . Hyperlipidemia   . Restless leg syndrome   . Hx of migraines   . Macular degeneration   . Seizures    Past Surgical History  Procedure Laterality Date  . Tonsillectomy    . Tubal ligation     History   Social History  . Marital Status: Widowed    Spouse Name: N/A    Number of Children: 2  . Years of Education: N/A   Occupational History  . retired (from [employer])    Social History Main Topics  . Smoking status: Never Smoker   . Smokeless tobacco: Never Used  . Alcohol Use: No  . Drug Use: No  . Sexual Activity: Not Currently   Other Topics Concern  . Not on file   Social History Narrative   Recently  moved to one level apartment.  Widowed 2009.  1 daughter in [place], 1 daughter in [place]   Outpatient Encounter Prescriptions as of 10/30/2013  Medication Sig Note  . Cholecalciferol (VITAMIN D) 2000 UNITS tablet Take 1,000 Units by mouth daily.    . diphenhydramine-acetaminophen (TYLENOL PM) 25-500 MG TABS Take 1 tablet by mouth at bedtime as needed. Takes most nights   . levETIRAcetam (KEPPRA) 500 MG tablet TAKE 1 TABLET BY MOUTH TWICE DAILY *NDC [phone]*   . lidocaine (XYLOCAINE) 2 % solution TAKE 1 TEASPOONFUL BY MOUTH 3 TIMES A DAY AS NEEDED FOR MOUTH PAIN 10/30/2013: Uses prn (filled 6/29), last used 3-4 days ago  . LYSINE PO Take 1 tablet by mouth daily.   Marland Kitchen. acetaminophen (TYLENOL) 325 MG  tablet Take 325 mg by mouth every 6 (six) hours as needed.   . hydrocortisone valerate cream (WESTCORT) 0.2 % APPLY TO AFFECTED AREA TWICE A DAY FOR ITCHY RASH 10/30/2013: Uses prn rash   Allergies  Allergen Reactions  . Sulfa Antibiotics Other (See Comments)    unknown   ROS:  No fevers, chills, cough, shortness of breath, chest pain, dysuria, hematuria, bleeding, bruising. +decreased appetite, sore throat, mouth pain as per HPI.  No seizures since meds have been changed by neuro.  PHYSICAL EXAM: BP 122/78  Pulse 76  Temp(Src) 97.6 F (36.4 C) (Tympanic)  Ht 5\' 2"  (1.575 m)  Wt 114 lb (51.71 kg)  BMI 20.85 kg/m2  Well developed, talkative, anxious female in no distress.  Interrupts with many questions HEENT:  PERRL, EOMI, conjunctiva clear. Nasal mucosa is mildly edematous, slightly pink, no purulence.  Sinuses nontender. OP--there is no visible erythema, lesions, ulcers.  Gums and mucosa appear normal. The tip of the tongue is somewhat irregular/lumpy, but not inflamed or red. Neck: no lymphadenopathy, thyromegaly or mass Heart: regular rate and rhythm Lungs: clear bilaterally Skin: normal  ASSESSMENT/PLAN:  Mouth pain - intermittent; NOT related to lamictal, which has been stopped. DDx reviewed. consider allergy testing vs ENT if ongoing problems  Allergic rhinitis, cause unspecified - she has evidence of AR on exam which might contribute to her sore throat, decreased appetite. Treat with claritin daily  Hyponatremia - mild, resolved. Reassurred that this is not related to her current compalints (mouth or incontinence)  Your sore throat might be partly related to post-nasal drainage from allergies.  I recommend taking loratidine (claritin) 10mg  once daily every day.  Avoid acidic foods such as citrus (lemons, limes, oranges, pineapple, grapefruit, tomatoes).  Use the lidocaine if needed for severe pain, so that you are able to eat and drink.  If you are having ongoing problems,  we should evaluate further.  Options include sending you to an ear, nose and throat doctor (for evaluation of mouth and tongue pain) vs an allergist for testing (especially if ongoing lip swelling).  Do not worry about your sodium levels--it was low just once, and fine since then.  This shouldn't be related to your mouth, nor cause you any significant problems.

## 2013-10-30 NOTE — Patient Instructions (Signed)
  Your sore throat might be partly related to post-nasal drainage from allergies.  I recommend taking loratidine (claritin) 10mg  once daily every day.  Avoid acidic foods such as citrus (lemons, limes, oranges, pineapple, grapefruit, tomatoes).  Use the lidocaine if needed for severe pain, so that you are able to eat and drink.  If you are having ongoing problems, we should evaluate further.  Options include sending you to an ear, nose and throat doctor (for evaluation of mouth and tongue pain) vs an allergist for testing (especially if ongoing lip swelling).  Do not worry about your sodium levels--it was low just once, and fine since then.  This shouldn't be related to your mouth, nor cause you any significant problems.

## 2013-11-04 ENCOUNTER — Other Ambulatory Visit: Payer: Self-pay | Admitting: Family Medicine

## 2013-11-04 DIAGNOSIS — J029 Acute pharyngitis, unspecified: Secondary | ICD-10-CM | POA: Diagnosis not present

## 2013-11-04 NOTE — Telephone Encounter (Signed)
Is this okay to refill? 

## 2013-11-04 NOTE — Telephone Encounter (Signed)
She didn't mention last week that she was running low.  Ok to refill just once.  If ongoing need, likely will need referral for further eval, as we had discussed at visit

## 2013-11-20 DIAGNOSIS — L259 Unspecified contact dermatitis, unspecified cause: Secondary | ICD-10-CM | POA: Diagnosis not present

## 2013-11-27 DIAGNOSIS — T7800XA Anaphylactic reaction due to unspecified food, initial encounter: Secondary | ICD-10-CM | POA: Diagnosis not present

## 2013-11-27 DIAGNOSIS — L259 Unspecified contact dermatitis, unspecified cause: Secondary | ICD-10-CM | POA: Diagnosis not present

## 2013-12-17 DIAGNOSIS — L259 Unspecified contact dermatitis, unspecified cause: Secondary | ICD-10-CM | POA: Diagnosis not present

## 2013-12-20 ENCOUNTER — Other Ambulatory Visit: Payer: Self-pay | Admitting: Family Medicine

## 2013-12-20 NOTE — Telephone Encounter (Signed)
Is this okay to refill? 

## 2013-12-20 NOTE — Telephone Encounter (Signed)
Patient states that her daughter's has taken her to see a allergist and she was tested for several different things and she was not positive for anything. She said to also make you aware that she had some bridge work done in March and every since this was done she has had this issue. She still has problems with her lips and can't taste anything. She states that her daughter wants to come in with her to see you and she was talking about getting some type of shot. Please advise if you think she should come in for a OV. CLS

## 2013-12-20 NOTE — Telephone Encounter (Signed)
Deny, and advise pt that we discussed at her last visit that if she is having ongoing issues with mouth pain, that we need to refer her elsewhere.  We had discussed allergist vs ENT. I would start with ENT

## 2013-12-20 NOTE — Telephone Encounter (Signed)
Please get allergy notes (they aren't in media section); see what type of shot daughter is referring to (?B12, steroid?).  Of course they are welcome to come for visit (lots of openings Monday), but I can't guarantee I'll be giving a shot.  Likely I will be referring to ENT, but happy to meet and discuss with them

## 2013-12-23 NOTE — Telephone Encounter (Signed)
Spoke with patient and she thinks allergist is recommending steroid injection. [facility], as this is where she went and she was seen 12/17/13 and OV is not transcribed yet-they will fax when ready. She has also already been to ENT and they were unable to help her, she will call me back with the name of the doctor and I will try to get those records too. She scheduled OV with you for this coming Thursday as she was unable to come in today.

## 2013-12-26 ENCOUNTER — Encounter: Payer: Self-pay | Admitting: Family Medicine

## 2013-12-26 ENCOUNTER — Ambulatory Visit (INDEPENDENT_AMBULATORY_CARE_PROVIDER_SITE_OTHER): Payer: Medicare Other | Admitting: Family Medicine

## 2013-12-26 VITALS — BP 122/66 | HR 72 | Ht 62.0 in | Wt 113.0 lb

## 2013-12-26 DIAGNOSIS — K1230 Oral mucositis (ulcerative), unspecified: Secondary | ICD-10-CM

## 2013-12-26 DIAGNOSIS — K121 Other forms of stomatitis: Secondary | ICD-10-CM

## 2013-12-26 DIAGNOSIS — R21 Rash and other nonspecific skin eruption: Secondary | ICD-10-CM

## 2013-12-26 DIAGNOSIS — Z9109 Other allergy status, other than to drugs and biological substances: Secondary | ICD-10-CM | POA: Diagnosis not present

## 2013-12-26 DIAGNOSIS — Z91048 Other nonmedicinal substance allergy status: Secondary | ICD-10-CM

## 2013-12-26 MED ORDER — HYDROCORTISONE VALERATE 0.2 % EX CREA: TOPICAL_CREAM | CUTANEOUS | Status: DC

## 2013-12-26 MED ORDER — TRIAMCINOLONE ACETONIDE 0.1 % MT PSTE: 1.0000 "application " | PASTE | Freq: Two times a day (BID) | OROMUCOSAL | Status: DC

## 2013-12-26 NOTE — Patient Instructions (Signed)
Use the steroid mouth paste up to twice daily, as needed for pain/irritation.  Use sparingly. Okay to use anbesol as needed.  I'd like to reserve cortisone/steroid shot for more severe reaction.

## 2013-12-26 NOTE — Progress Notes (Signed)
Chief Complaint  Patient presents with  . Follow-up    on mouth pain.    Patient presents for f/u on mouth pain (asked to schedule when calling for refills of viscous lidocaine).  She has seen ENT and allergist, but no records had been received here.  They were faxed while patient was at office today.  She saw Dr. Ezzard Standing 11/04/13--dx reactive oral mucositis. No treatment recommendations wer emade. She then saw Dr. Nunzio Cobbs (allergist) 11/20/13--couldn't test due to taking antihistamines.  She returned 8/5 for eval:  Food testing was negative.  They then contacted the dentist to test for materials used in her mouth.  That is when they tested for GC fuji adhesive--+reaction per pt. This is in her bridgework.  She still has raised spot on her back from the testing a few weeks ago. Review of the notes showed that at 48 hr f/u (with Dr. Willa Rough), that the Fuji spot was negative, but there was tape reaction.  She was to f/u with Dr. Nunzio Cobbs on 8/25 for 7 day eval--this note has not yet been received.  Patient states she was told of reaction to fuji adhesive and appointment was made for her to f/u with her dentist, Dr. Derrill Kay. Felt that it might not be severe enough to justify doing $2000 worth of dental work to remove.  Most of her discomfort is centrally, in the front, in upper and lower jaw. No longer diffusely in mouth and tongue, as before. She is unable to tolerate acidic foods (citrus)--flares up the pain in this area.  Overall her taste buds have improved, no soreness or swelling in the tongue. She has been using Anbesol, which helps some. The viscous lidocaine is very helpful. She is no longer having the diffuse mouth pain, just localized to these small, anterior areas.  She states that her daughter is asking about giving a steroid shot.  She is asking for refill on hydrocortisone valerate 0.2% cream.  She likes to keep it on hand for various rashes, but bites. She has used this for years, and is  requesting refill. She periodically will get itchy rashes on her arms.  Past Medical History  Diagnosis Date  . Osteoporosis     DEXA 03/2010 T-3.2 R hip; declines meds  . Vitamin D deficiency   . GERD (gastroesophageal reflux disease)   . Tinnitus 05/2009    DR BATES--related to change in med/generic.  Resolved  . Seizure disorder 1997    evaluated by [facility] in past  . PUD (peptic ulcer disease) 1980  . Hyperlipidemia   . Restless leg syndrome   . Hx of migraines   . Macular degeneration   . Seizures    Past Surgical History  Procedure Laterality Date  . Tonsillectomy    . Tubal ligation     History   Social History  . Marital Status: Widowed    Spouse Name: N/A    Number of Children: 2  . Years of Education: N/A   Occupational History  . retired (from Community education officer)    Social History Main Topics  . Smoking status: Never Smoker   . Smokeless tobacco: Never Used  . Alcohol Use: No  . Drug Use: No  . Sexual Activity: Not Currently   Other Topics Concern  . Not on file   Social History Narrative   Recently moved to one level apartment.  Widowed 2009.  1 daughter in [place], 1 daughter in [place]   Outpatient Encounter Prescriptions as of  12/26/2013  Medication Sig  . Calcium-Phosphorus-Vitamin D (CITRACAL +D3 PO) Take 1 tablet by mouth daily.  . hydrocortisone valerate cream (WESTCORT) 0.2 % APPLY TO AFFECTED AREA TWICE A DAY FOR ITCHY RASH  . levETIRAcetam (KEPPRA) 500 MG tablet TAKE 1 TABLET BY MOUTH TWICE DAILY *NDC [phone]*  . [DISCONTINUED] hydrocortisone valerate cream (WESTCORT) 0.2 % APPLY TO AFFECTED AREA TWICE A DAY FOR ITCHY RASH  . acetaminophen (TYLENOL) 325 MG tablet Take 325 mg by mouth every 6 (six) hours as needed.  . diphenhydramine-acetaminophen (TYLENOL PM) 25-500 MG TABS Take 1 tablet by mouth at bedtime as needed. Takes most nights  . lidocaine (XYLOCAINE) 2 % solution TAKE 1 TEASPOONFUL 3 TIMES A DAY AS NEEDED FOR MOUTH PAIN  . LYSINE PO Take 1  tablet by mouth daily.  Marland Kitchen triamcinolone (KENALOG) 0.1 % paste Use as directed 1 application in the mouth or throat 2 (two) times daily. Use as needed for irritation/sores  . [DISCONTINUED] Cholecalciferol (VITAMIN D) 2000 UNITS tablet Take 1,000 Units by mouth daily.    Allergies  Allergen Reactions  . Sulfa Antibiotics Other (See Comments)    unknown   ROS:  Denies fevers, chills, URI symptoms, headaches, dizziness, no seizure activity.  No nausea, vomiting, GI complaints, bleeding, bruising, chest pain, shortness of breath or other complaints.  PHYSICAL EXAM: BP 122/66  Pulse 72  Ht  (1.575 m)  Wt 113 lb (51.256 kg)  BMI 20.66 kg/m2 Well developed, pleasant female in no distress HEENT:  PERRL, EOMI, conjunctiva clear.   Mouth: Mild irritation (raised, non-erythematous) on buccal mucosa (not on gums) centrally in front of front two upper and lower teeth.  No ulceration noted.  Remainder of mucosa, and tongue appears normal Neck: no lymphadenopathy Skin: no rash, other than raised erythematous patch on her upper back (residual + reaction from skin testing) Psych: normal mood, affect, hygiene and grooming.  Just slightly anxious Neuro: alert and oriented.  Cranial nerves intact. Normal strength, gait.   ASSESSMENT/PLAN:   Mucositis - Plan: triamcinolone (KENALOG) 0.1 % paste  Rash and nonspecific skin eruption - Plan: hydrocortisone valerate cream (WESTCORT) 0.2 %  Allergy to adhesive - used in [facility], as per +allergy testing per pt (await 8/25 note)  We discussed risks/side effects of steroid shots--given that her overall symptoms are much more localized, I do not think it is worth the risk/side effects.  Prefer trial of topical steroid paste to the limited/focal areas in her mouth.  She agrees. Trial of TAC paste, used sparingly BID.  This might treat the inflammation and reaction, rather than just numbing the discomfort, like the viscous lidocaine was doing.  Use prn.   Okay to continue anbesol prn also.  Continue to avoid acidic foods.  F/u as scheduled in April, sooner prn  25 min face to face OV today, more than 1/2 spent counseling, answering questions, as well as reviewing outside records, and discussing risks of treatments.  All questions/concerned answered today.

## 2014-01-16 DIAGNOSIS — Z1231 Encounter for screening mammogram for malignant neoplasm of breast: Secondary | ICD-10-CM | POA: Diagnosis not present

## 2014-01-16 LAB — HM MAMMOGRAPHY

## 2014-01-17 ENCOUNTER — Encounter: Payer: Self-pay | Admitting: Internal Medicine

## 2014-01-19 DIAGNOSIS — Z23 Encounter for immunization: Secondary | ICD-10-CM | POA: Diagnosis not present

## 2014-02-14 ENCOUNTER — Other Ambulatory Visit: Payer: Self-pay | Admitting: Family Medicine

## 2014-02-14 NOTE — Telephone Encounter (Signed)
Is this okay to refill? 

## 2014-03-18 ENCOUNTER — Telehealth: Payer: Self-pay | Admitting: Internal Medicine

## 2014-03-18 MED ORDER — LEVETIRACETAM 500 MG PO TABS: ORAL_TABLET | ORAL | Status: DC

## 2014-03-18 NOTE — Telephone Encounter (Signed)
#  90 day supply of keppra to [facility]

## 2014-04-08 ENCOUNTER — Telehealth: Payer: Self-pay | Admitting: Family Medicine

## 2014-04-08 MED ORDER — LIDOCAINE VISCOUS 2 % MT SOLN: OROMUCOSAL | Status: DC

## 2014-04-08 NOTE — Telephone Encounter (Signed)
Done. Please let pt know

## 2014-04-08 NOTE — Telephone Encounter (Signed)
Called pt to let her know that med has been refilled. Left message.

## 2014-04-08 NOTE — Telephone Encounter (Signed)
Requesting refill on Lidocaine solution. Pt has a outbreak with her mouth again and need some relief

## 2014-05-19 DIAGNOSIS — H2513 Age-related nuclear cataract, bilateral: Secondary | ICD-10-CM | POA: Diagnosis not present

## 2014-05-19 DIAGNOSIS — H40013 Open angle with borderline findings, low risk, bilateral: Secondary | ICD-10-CM | POA: Diagnosis not present

## 2014-05-19 DIAGNOSIS — H04123 Dry eye syndrome of bilateral lacrimal glands: Secondary | ICD-10-CM | POA: Diagnosis not present

## 2014-05-19 DIAGNOSIS — H3531 Nonexudative age-related macular degeneration: Secondary | ICD-10-CM | POA: Diagnosis not present

## 2014-08-13 ENCOUNTER — Encounter: Payer: Self-pay | Admitting: Family Medicine

## 2014-08-13 ENCOUNTER — Ambulatory Visit (INDEPENDENT_AMBULATORY_CARE_PROVIDER_SITE_OTHER): Payer: Medicare Other | Admitting: Family Medicine

## 2014-08-13 ENCOUNTER — Other Ambulatory Visit (HOSPITAL_COMMUNITY)
Admission: RE | Admit: 2014-08-13 | Discharge: 2014-08-13 | Disposition: A | Discharge status: Home / Self Care | Payer: Medicare Other | Source: Ambulatory Visit | Attending: Family Medicine | Admitting: Family Medicine

## 2014-08-13 VITALS — BP 150/98 | HR 76 | Ht 62.0 in | Wt 118.2 lb

## 2014-08-13 DIAGNOSIS — Z5181 Encounter for therapeutic drug level monitoring: Secondary | ICD-10-CM

## 2014-08-13 DIAGNOSIS — Z01419 Encounter for gynecological examination (general) (routine) without abnormal findings: Secondary | ICD-10-CM | POA: Diagnosis not present

## 2014-08-13 DIAGNOSIS — E7800 Pure hypercholesterolemia, unspecified: Secondary | ICD-10-CM

## 2014-08-13 DIAGNOSIS — G40909 Epilepsy, unspecified, not intractable, without status epilepticus: Secondary | ICD-10-CM

## 2014-08-13 DIAGNOSIS — Z124 Encounter for screening for malignant neoplasm of cervix: Secondary | ICD-10-CM | POA: Diagnosis not present

## 2014-08-13 DIAGNOSIS — Z1151 Encounter for screening for human papillomavirus (HPV): Secondary | ICD-10-CM | POA: Diagnosis not present

## 2014-08-13 DIAGNOSIS — R030 Elevated blood-pressure reading, without diagnosis of hypertension: Secondary | ICD-10-CM | POA: Diagnosis not present

## 2014-08-13 DIAGNOSIS — Z0000 Encounter for general adult medical examination without abnormal findings: Secondary | ICD-10-CM

## 2014-08-13 DIAGNOSIS — Z23 Encounter for immunization: Secondary | ICD-10-CM | POA: Diagnosis not present

## 2014-08-13 DIAGNOSIS — R5383 Other fatigue: Secondary | ICD-10-CM | POA: Diagnosis not present

## 2014-08-13 DIAGNOSIS — M810 Age-related osteoporosis without current pathological fracture: Secondary | ICD-10-CM

## 2014-08-13 DIAGNOSIS — Z7189 Other specified counseling: Secondary | ICD-10-CM | POA: Diagnosis not present

## 2014-08-13 DIAGNOSIS — IMO0001 Reserved for inherently not codable concepts without codable children: Secondary | ICD-10-CM

## 2014-08-13 LAB — COMPREHENSIVE METABOLIC PANEL
ALT: 12 U/L (ref 0–35)
AST: 19 U/L (ref 0–37)
Albumin: 4.2 g/dL (ref 3.5–5.2)
Alkaline Phosphatase: 28 U/L — ABNORMAL LOW (ref 39–117)
BUN: 13 mg/dL (ref 6–23)
CALCIUM: 9.6 mg/dL (ref 8.4–10.5)
CHLORIDE: 100 meq/L (ref 96–112)
CO2: 25 meq/L (ref 19–32)
Creat: 0.7 mg/dL (ref 0.50–1.10)
Glucose, Bld: 94 mg/dL (ref 70–99)
POTASSIUM: 4.6 meq/L (ref 3.5–5.3)
SODIUM: 137 meq/L (ref 135–145)
TOTAL PROTEIN: 7.1 g/dL (ref 6.0–8.3)
Total Bilirubin: 0.4 mg/dL (ref 0.2–1.2)

## 2014-08-13 LAB — LIPID PANEL
Cholesterol: 203 mg/dL — ABNORMAL HIGH (ref 0–200)
HDL: 100 mg/dL (ref >=46)
LDL Cholesterol: 86 mg/dL (ref 0–99)
TRIGLYCERIDES: 87 mg/dL (ref <150)
Total CHOL/HDL Ratio: 2 Ratio
VLDL: 17 mg/dL (ref 0–40)

## 2014-08-13 LAB — CBC WITH DIFFERENTIAL/PLATELET
Basophils Absolute: 0 10*3/uL (ref 0.0–0.1)
Basophils Relative: 0 % (ref 0–1)
EOS PCT: 1 % (ref 0–5)
Eosinophils Absolute: 0.1 10*3/uL (ref 0.0–0.7)
HEMATOCRIT: 39.3 % (ref 36.0–46.0)
Hemoglobin: 13.1 g/dL (ref 12.0–15.0)
LYMPHS ABS: 1.5 10*3/uL (ref 0.7–4.0)
Lymphocytes Relative: 20 % (ref 12–46)
MCH: 28.2 pg (ref 26.0–34.0)
MCHC: 33.3 g/dL (ref 30.0–36.0)
MCV: 84.7 fL (ref 78.0–100.0)
MONO ABS: 0.5 10*3/uL (ref 0.1–1.0)
MONOS PCT: 6 % (ref 3–12)
MPV: 10.2 fL (ref 8.6–12.4)
NEUTROS PCT: 73 % (ref 43–77)
Neutro Abs: 5.5 10*3/uL (ref 1.7–7.7)
Platelets: 216 10*3/uL (ref 150–400)
RBC: 4.64 MIL/uL (ref 3.87–5.11)
RDW: 15.2 % (ref 11.5–15.5)
WBC: 7.5 10*3/uL (ref 4.0–10.5)

## 2014-08-13 LAB — TSH: TSH: 0.999 u[IU]/mL (ref 0.350–4.500)

## 2014-08-13 NOTE — Patient Instructions (Addendum)
HEALTH MAINTENANCE RECOMMENDATIONS:  It is recommended that you get at least 30 minutes of aerobic exercise at least 5 days/week (for weight loss, you may need as much as 60-90 minutes). This can be any activity that gets your heart rate up. This can be divided in 10-15 minute intervals if needed, but try and build up your endurance at least once a week.  Weight bearing exercise is also recommended twice weekly.  Eat a healthy diet with lots of vegetables, fruits and fiber.  "Colorful" foods have a lot of vitamins (ie green vegetables, tomatoes, red peppers, etc).  Limit sweet tea, regular sodas and alcoholic beverages, all of which has a lot of calories and sugar.  Up to 1 alcoholic drink daily may be beneficial for women (unless trying to lose weight, watch sugars).  Drink a lot of water.  Calcium recommendations are 1200-1500 mg daily (1500 mg for postmenopausal women or women without ovaries), and vitamin D 1000 IU daily.  This should be obtained from diet and/or supplements (vitamins), and calcium should not be taken all at once, but in divided doses.  Monthly self breast exams and yearly mammograms for women over the age of 78 is recommended.  Sunscreen of at least SPF 30 should be used on all sun-exposed parts of the skin when outside between the hours of 10 am and 4 pm (not just when at beach or pool, but even with exercise, golf, tennis, and yard work!)  Use a sunscreen that says "broad spectrum" so it covers both UVA and UVB rays, and make sure to reapply every 1-2 hours.  Remember to change the batteries in your smoke detectors when changing your clock times in the spring and fall.  Use your seat belt every time you are in a car, and please drive safely and not be distracted with cell phones and texting while driving.  You are due for your colonoscopy.  Let me know when you are ready for [place] to refer you, and if you have a preference of who you want to see (I don't know who you saw last  time--they prefer you to go back to the same provider/practice)  Consider shingles vaccine. ytou were given the Prevnar vaccine today.  Your blood pressure was high today.  Please check your blood pressure at least 1-2 times/week and write them down.  Check prior to and [facility]  Try and follow a low sodium diet (see below) and exercise at least 30 minutes every day, as this keeps the blood pressure down.  Return here in 1 month and BRING THE LIST OF BLOOD PRESSURES WITH YOU.   If your blood pressure are consistently over 140/90, despite regular exercise and low sodium diet, then a medication is needed.  Low-Sodium Eating Plan Sodium raises blood pressure and causes water to be held in the body. Getting less sodium from food will help lower your blood pressure, reduce any swelling, and protect your heart, liver, and kidneys. We get sodium by adding salt (sodium chloride) to food. Most of our sodium comes from canned, boxed, and frozen foods. Restaurant foods, fast foods, and pizza are also very high in sodium. Even if you take medicine to lower your blood pressure or to reduce fluid in your body, getting less sodium from your food is important. WHAT IS MY PLAN? Most people should limit their sodium intake to 2,300 mg a day. Your health care provider recommends that you limit your sodium  intake to __________ a day.  WHAT DO I NEED TO KNOW ABOUT THIS EATING PLAN? For the low-sodium eating plan, you will follow these general guidelines:  Choose foods with a % Daily Value for sodium of less than 5% (as listed on the food label).   Use salt-free seasonings or herbs instead of table salt or sea salt.   Check with your health care provider or pharmacist before using salt substitutes.   Eat fresh foods.  Eat more vegetables and fruits.  Limit canned vegetables. If you do use them, rinse them well to decrease the sodium.   Limit cheese to 1 oz (28 g) per day.    Eat lower-sodium products, often labeled as "lower sodium" or "no salt added."  Avoid foods that contain monosodium glutamate (MSG). MSG is sometimes added to [place] food and some canned foods.  Check food labels (Nutrition Facts labels) on foods to learn how much sodium is in one serving.  Eat more home-cooked food and less restaurant, buffet, and fast food.  When eating at a restaurant, ask that your food be prepared with less salt or none, if possible.  HOW DO I READ FOOD LABELS FOR SODIUM INFORMATION? The Nutrition Facts label lists the amount of sodium in one serving of the food. If you eat more than one serving, you must multiply the listed amount of sodium by the number of servings. Food labels may also identify foods as:  Sodium free--Less than 5 mg in a serving.  Very low sodium--35 mg or less in a serving.  Low sodium--140 mg or less in a serving.  Light in sodium--50% less sodium in a serving. For example, if a food that usually has 300 mg of sodium is changed to become light in sodium, it will have 150 mg of sodium.  Reduced sodium--25% less sodium in a serving. For example, if a food that usually has 400 mg of sodium is changed to reduced sodium, it will have 300 mg of sodium. WHAT FOODS CAN I EAT? Grains Low-sodium cereals, including oats, puffed wheat and rice, and shredded wheat cereals. Low-sodium crackers. Unsalted rice and pasta. Lower-sodium bread.  Vegetables Frozen or fresh vegetables. Low-sodium or reduced-sodium canned vegetables. Low-sodium or reduced-sodium tomato sauce and paste. Low-sodium or reduced-sodium tomato and vegetable juices.  Fruits Fresh, frozen, and canned fruit. Fruit juice.  Meat and Other Protein Products Low-sodium canned tuna and salmon. Fresh or frozen meat, poultry, seafood, and fish. Lamb. Unsalted nuts. Dried beans, peas, and lentils without added salt. Unsalted canned beans. Homemade soups without salt. Eggs.   Dairy Milk. Soy milk. Ricotta cheese. Low-sodium or reduced-sodium cheeses. Yogurt.  Condiments Fresh and dried herbs and spices. Salt-free seasonings. Onion and garlic powders. Low-sodium varieties of mustard and ketchup. Lemon juice.  Fats and Oils Reduced-sodium salad dressings. Unsalted butter.  Other Unsalted popcorn and pretzels.  The items listed above may not be a complete list of recommended foods or beverages. Contact your dietitian for more options. WHAT FOODS ARE NOT RECOMMENDED? Grains Instant hot cereals. Bread stuffing, pancake, and biscuit mixes. Croutons. Seasoned rice or pasta mixes. Noodle soup cups. Boxed or frozen macaroni and cheese. Self-rising flour. Regular salted crackers. Vegetables Regular canned vegetables. Regular canned tomato sauce and paste. Regular tomato and vegetable juices. Frozen vegetables in sauces. Salted french fries. Olives. Rosita FirePickles. Relishes. Sauerkraut. Salsa. Meat and Other Protein Products Salted, canned, smoked, spiced, or pickled meats, seafood, or fish. Bacon, ham, sausage, hot dogs, corned beef, chipped beef, and  packaged luncheon meats. Salt pork. Jerky. Pickled herring. Anchovies, regular canned tuna, and sardines. Salted nuts. Dairy Processed cheese and cheese spreads. Cheese curds. Blue cheese and cottage cheese. Buttermilk.  Condiments Onion and garlic salt, seasoned salt, table salt, and sea salt. Canned and packaged gravies. Worcestershire sauce. Tartar sauce. Barbecue sauce. Teriyaki sauce. Soy sauce, including reduced sodium. Steak sauce. Fish sauce. Oyster sauce. Cocktail sauce. Horseradish. Regular ketchup and mustard. Meat flavorings and tenderizers. Bouillon cubes. Hot sauce. Tabasco sauce. Marinades. Taco seasonings. Relishes. Fats and Oils Regular salad dressings. Salted butter. Margarine. Ghee. Bacon fat.  Other Potato and tortilla chips. Corn chips and puffs. Salted popcorn and pretzels. Canned or dried soups.  Pizza. Frozen entrees and pot pies.  The items listed above may not be a complete list of foods and beverages to avoid. Contact your dietitian for more information. Document Released: 10/01/2001 Document Revised: 04/16/2013 Document Reviewed: 02/13/2013 [facility] Patient Information 2015 [place], [place]. This information is not intended to replace advice given to you by your health care provider. Make sure you discuss any questions you have with your health care provider.

## 2014-08-13 NOTE — Progress Notes (Signed)
Chief Complaint  Patient presents with  . Med check plus    fasting with pap. No concerns today.    Alicia [name] is a 81 y.o. female who presents for annual wellness visit and follow-up on chronic medical conditions.  She has the following concerns:  Her mouth pain is much better--only has some discomfort eating acidic foods (which she tries to avoid).  Seizure disorder: Last seizure was probably in 1998, when medications were stopped (diagnosed with seizures in 1997). She had been on both Keppra and Lamictal until last year. She went back to [facility] last year when having problems with her mouth pain.  She was taken off the Lamictal (1 year ago), and she remains on Keppra. She has not had any seizures in the last year.  Osteoporosis--medications have been reviewed in detail in the past, but patient isn't interested in treatment, other than doing her exercises (works out at [employer]), and taking vitamin D. She won't take any calcium supplements but "drinks a lot of milk".   Immunization History  Administered Date(s) Administered  . Influenza Split 03/01/2012  . Influenza, High Dose Seasonal PF 01/19/2014  . Influenza-Unspecified 12/24/2012  . Pneumococcal Polysaccharide-23 04/26/1999, 09/01/2006  . Td 09/01/2006  . Tdap 07/25/2011   Last Pap smear: years ago (01/2009 per computer?), declined last year; Last mammogram:  12/2013 Last colonoscopy: Per Dr. Naaman Plummer first note at [facility], said 2 years prior (so would have been approx 2006) --over 10 years ago per pt now. Last DEXA: 03/2010 (shows osteoporosis, but pt refuses treatment) Ophtho: goes yearly (Dr. Herbert Deaner in July) Dentist: once a year, due now. [facility], does [employer] classes (includes weights). She no longer does the bike because she doesn't have the strength in her hands to make the adjustments to the position.  She does the treadmill occasionally. No exercise outside of going to the  gym   Other doctors caring for patient include: Dentist: Dr. Archie Balboa ophtho--Dr. Sharyon Cable for classes, Dr. Herbert Deaner for cataracts Neuro: at [facility] (last seen 07/2013) Dr. Jeoffrey Massed  Depression screen:  See scanned questionnaire.  Notable for some decreased appetite and trouble sleeping (does well with taking Tylenol PM nightly) ADL screen:  See scanned questionnaire.  Notable for intermittent right ear ringing, vision changes related to cataracts, and urinary incontinence.  End of Life Discussion:  Patient has a living will and medical power of attorney  Past Medical History  Diagnosis Date  . Osteoporosis     DEXA 03/2010 T-3.2 R hip; declines meds  . Vitamin D deficiency   . GERD (gastroesophageal reflux disease)   . Tinnitus 05/2009    DR BATES--related to change in med/generic.  Resolved  . Seizure disorder 1997    evaluated by [facility] in past  . PUD (peptic ulcer disease) 1980  . Hyperlipidemia   . Restless leg syndrome   . Hx of migraines   . Macular degeneration   . Seizures     Past Surgical History  Procedure Laterality Date  . Tonsillectomy    . Tubal ligation      History   Social History  . Marital Status: Widowed    Spouse Name: N/A  . Number of Children: 2  . Years of Education: N/A   Occupational History  . retired (from Insurance underwriter)    Social History Main Topics  . Smoking status: Never Smoker   . Smokeless tobacco: Never Used  . Alcohol Use: No  .  Drug Use: No  . Sexual Activity: Not Currently   Other Topics Concern  . Not on file   Social History Narrative   Lives alone (in a one-level townhome).  Widowed 2009.  1 daughter in [place], 1 daughter in [place]. 5 grandchildren    Family History  Problem Relation Age of Onset  . Heart disease Mother   . Dementia Father   . Alzheimer's disease Sister   . Diabetes Neg Hx   . Cancer Neg Hx     Outpatient Encounter Prescriptions as of 08/13/2014  Medication Sig Note  . acetaminophen (TYLENOL) 325  MG tablet Take 325 mg by mouth every 6 (six) hours as needed.   . cholecalciferol (VITAMIN D) 1000 UNITS tablet Take 1,000 Units by mouth daily.   . diphenhydramine-acetaminophen (TYLENOL PM) 25-500 MG TABS Take 1 tablet by mouth at bedtime as needed. Takes most nights   . hydrocortisone valerate cream (WESTCORT) 0.2 % APPLY TO AFFECTED AREA TWICE A DAY FOR ITCHY RASH (Patient not taking: Reported on 08/13/2014)   . levETIRAcetam (KEPPRA) 500 MG tablet TAKE 1 TABLET BY MOUTH TWICE DAILY *NDC [id number]*   . lidocaine (XYLOCAINE) 2 % solution TAKE 1 TEASPOONFUL 3 TIMES A DAY AS NEEDED FOR MOUTH PAIN (Patient not taking: Reported on 08/13/2014)   . triamcinolone (KENALOG) 0.1 % paste Use as directed 1 application in the mouth or throat 2 (two) times daily. Use as needed for irritation/sores (Patient not taking: Reported on 08/13/2014)   . [DISCONTINUED] Calcium-Phosphorus-Vitamin D (CITRACAL +D3 PO) Take 1 tablet by mouth daily.   . [DISCONTINUED] lamoTRIgine (LAMICTAL) 100 MG tablet Take by mouth. 08/13/2014: Received from: [facility]  . [DISCONTINUED] levETIRAcetam (KEPPRA) 500 MG tablet  08/13/2014: Received from: [facility]  . [DISCONTINUED] LYSINE PO Take 1 tablet by mouth daily.     Allergies  Allergen Reactions  . Sulfa Antibiotics Other (See Comments)    unknown       ROS: The patient denies anorexia, fever, weight changes, headaches, seizures, vision changes, ear pain, sore throat, breast concerns, chest pain, palpitations, dizziness, syncope, dyspnea on exertion, cough, swelling, nausea, vomiting, diarrhea, constipation, abdominal pain, melena, hematochezia, indigestion/heartburn (infrequent), vaginal bleeding, discharge, odor or itch, genital lesions, numbness, tingling, weakness, tremor, suspicious skin lesions, depression, abnormal bleeding/bruising, or enlarged lymph nodes.  Occasional heartburn with spicy food (relieved by pepcid or mylanta as  needed). +urinary incontinence, wears pads.  Denies dysuria; not interested in medications. Mild sniffling from allergies in the Spring, mostly in the morning she is bothered with runny nose. ringing in ears intermittently (not currently),noticed at night, faint, right ear.  PHYSICAL EXAM:  BP 150/98 mmHg  Pulse 76  Ht $R'5\' 2"'HZ$  (1.575 m)  Wt 118 lb 3.2 oz (53.615 kg)  BMI 21.61 kg/m2  General Appearance:  Alert, cooperative, no distress, appears stated age   Head:  Normocephalic, without obvious abnormality, atraumatic   Eyes:  PERRL, conjunctiva/corneas clear, EOM's intact, fundi  benign   Ears:  Normal TM's and external ear canals   Nose:  Nares normal, mucosa normal, no drainage or sinus tenderness   Throat:  Tongue and teeth are normal.Mucosa is normal  Neck:  Supple, no lymphadenopathy; thyroid: no enlargement/tenderness/nodules; no carotid  bruit or JVD   Back:  Spine nontender, no curvature, ROM normal, no CVA tenderness   Lungs:  Clear to auscultation bilaterally without wheezes, rales or ronchi; respirations unlabored   Chest Wall:  No tenderness or deformity  Heart:  Regular rate and rhythm, S1 and S2 normal, no murmur, rub  or gallop   Breast Exam:  No tenderness, masses, or nipple discharge or inversion. No axillary lymphadenopathy   Abdomen:  Soft, non-tender, nondistended, normoactive bowel sounds,  no masses, no hepatosplenomegaly   Genitalia:  Normal external genitalia.  Some atrophic changes noted, no lesions. No abnormal vaginal discharge.  Cervix is normal witout lesions. No cervical motion tenderness.  Uterus and adnexa are normal, nontender, no masses  Rectal:  Normal sphincter tone, no masses.  Heme negative stool.  Extremities:  No clubbing, cyanosis or edema   Pulses:  2+ and symmetric all extremities   Skin:  Skin color, texture, turgor normal, no rashes or lesions   Lymph nodes:  Cervical, supraclavicular, and axillary  nodes normal   Neurologic:  CNII-XII intact, normal strength, sensation and gait; reflexes 2+ and symmetric throughout    Psych: Normal mood, affect, hygiene and grooming       ASSESSMENT/PLAN:  Medicare annual wellness visit, subsequent  Seizure disorder - controlled on Keppra alone; no recurrent seizures since stopping Lamictal a year ago. continue Keppra - Plan: TSH  Osteoporosis - discussed risks, and reasons to treat.  Declines any treatment.  Therefore no point in following DEXA. encouraged yearly bisphosphonate infusion (declined) - Plan: TSH  Immunization due - Plan: Pneumococcal conjugate vaccine 13-valent  Elevated blood pressure - discussed need to exercise daily, low sodium diet, and monitor BP and record.  f/u 1 month - Plan: Comprehensive metabolic panel  Medication monitoring encounter - Plan: TSH, CBC with Differential/Platelet, Comprehensive metabolic panel  Pure hypercholesterolemia - Plan: Lipid panel  Other fatigue - Plan: TSH, CBC with Differential/Platelet, Comprehensive metabolic panel  Encounter for routine gynecological examination - Plan: Cytology - PAP [place]  Advanced care planning/counseling discussion  CBC, c-met, TSH, lipid  Elevated blood pressure--counseled re: low sodium diet, checking elsewhere to r/o white coat component. Daily exercise encouraged.  Osteoporosis: pt refuses treatment. Risks of untreated osteoporosis reviewed. reviewed dietary calcium sources (refuses pills); continue Vitamin D   Discussed monthly self breast exams and yearly mammograms after the age of 58; at least 30 minutes of aerobic activity at least 5 days/week  and weight-bearing exercise 2x/week; proper sunscreen use reviewed; healthy diet, including goals of calcium and vitamin D intake and alcohol recommendations (less than or equal to 1 drink/day) reviewed; regular seatbelt use; changing batteries in smoke detectors. Immunization  recommendations discussed-zostavax recommended (declines), Prevnar-13--risks/side effects were reviewed and she is willing to get this today. Continue yearly flu shots. Colonoscopy recommendations reviewed--due now.  Recommended.  She would like to "think about it".  She doesn't recall who did her last colonoscopy.  F/u 1 month with list of blood pressures.  Medicare Attestation I have personally reviewed: The patient's medical and social history Their use of alcohol, tobacco or illicit drugs Their current medications and supplements The patient's functional ability including ADLs,fall risks, home safety risks, cognitive, and hearing and visual impairment Diet and physical activities Evidence for depression or mood disorders  The patient's weight, height, BMI, and visual acuity have been recorded in the chart.  I have made referrals, counseling, and provided education to the patient based on review of the above and I have provided the patient with a written personalized care plan for preventive services.     Trameka Dorough A, MD   08/13/2014

## 2014-08-14 ENCOUNTER — Encounter: Payer: Self-pay | Admitting: Family Medicine

## 2014-08-15 LAB — CYTOLOGY - PAP

## 2014-09-10 ENCOUNTER — Ambulatory Visit (INDEPENDENT_AMBULATORY_CARE_PROVIDER_SITE_OTHER): Payer: Medicare Other | Admitting: Family Medicine

## 2014-09-10 ENCOUNTER — Encounter: Payer: Self-pay | Admitting: Family Medicine

## 2014-09-10 VITALS — BP 152/98 | HR 72 | Ht 62.0 in | Wt 119.8 lb

## 2014-09-10 DIAGNOSIS — G40909 Epilepsy, unspecified, not intractable, without status epilepticus: Secondary | ICD-10-CM | POA: Diagnosis not present

## 2014-09-10 DIAGNOSIS — IMO0001 Reserved for inherently not codable concepts without codable children: Secondary | ICD-10-CM

## 2014-09-10 DIAGNOSIS — R030 Elevated blood-pressure reading, without diagnosis of hypertension: Secondary | ICD-10-CM

## 2014-09-10 NOTE — Progress Notes (Signed)
Chief Complaint  Patient presents with  . Follow-up    on bp.    Patient presents for follow up on hypertension.  Since her last visit, she cut out drinking Ensure (due to having 200mg  of sodium, and also her mouth problems improved and she is eating better).  She occasionally has a few chips with a sandwich.  She eats some canned green beans, pinto beans. [employer] chicken nuggets.  [facility]  [facility]  [facility], it did a series of 3, and would get lower with each check (165 down to 143 one time, and 140 initially, down to 128 systolic another time).  169/92 immediately [facility]  Another [facility] the [facility] was 135/88.  Denies headaches, dizziness, chest pain, shortness of breath. She is extremely hesitant to take any medications.  She is also asking about whether she should be taking lamictal.  She denies any recurrent seizures since it was stopped, and she is just taking Keppra.  Mouth discomfort has improved.  Certain foods might still bother her lips some. No swelling or severe pain.  PMH, PSH, SH reviewed.  Outpatient Encounter Prescriptions as of 09/10/2014  Medication Sig  . cholecalciferol (VITAMIN D) 1000 UNITS tablet Take 1,000 Units by mouth daily.  . diphenhydramine-acetaminophen (TYLENOL PM) 25-500 MG TABS Take 1 tablet by mouth at bedtime as needed. Takes most nights  . hydrocortisone valerate cream (WESTCORT) 0.2 % APPLY TO AFFECTED AREA TWICE A DAY FOR ITCHY RASH  . levETIRAcetam (KEPPRA) 500 MG tablet TAKE 1 TABLET BY MOUTH TWICE DAILY *NDC [id number]*  . acetaminophen (TYLENOL) 325 MG tablet Take 325 mg by mouth every 6 (six) hours as needed.  . lidocaine (XYLOCAINE) 2 % solution TAKE 1 TEASPOONFUL 3 TIMES A DAY AS NEEDED FOR MOUTH PAIN (Patient not taking: Reported on 09/10/2014)  . triamcinolone (KENALOG) 0.1 % paste Use as directed 1 application in the mouth or throat 2  (two) times daily. Use as needed for irritation/sores (Patient not taking: Reported on 08/13/2014)   No facility-administered encounter medications on file as of 09/10/2014.   (not taking any of the mouth medications, not needed)  Allergies  Allergen Reactions  . Sulfa Antibiotics Other (See Comments)    unknown   ROS: no fevers, chills, headaches, dizziness, chest pain, palpitations, shortness of breath, nausea, vomiting, bowel changes, bleeding, bruising, rash.   No seizures. No URI symptoms, cough.  See HPI  PHYSICAL EXAM: BP 152/98 mmHg  Pulse 72  Ht 5\' 2"  (1.575 m)  Wt 119 lb 12.8 oz (54.341 kg)  BMI 21.91 kg/m2  162/88 on repeat by MD Somewhat excitable/anxious female, in no distress HEENT: PERRL, EOMI, conjunctiva clear. OP clear Neck: no lymphadenopathy or mass Heart: regular rate and rhythm Lungs: clear bilaterally Abdomen: soft, nontender Extremities: no edema Skin: no rashes Psych: slightly anxious. Normal mood, hygiene and grooming Neuro: alert and oriented. Cranial nerves normal, normal strength, gait  ASSESSMENT/PLAN:  Elevated BP - significant fluctuations when checked elsewhere. Low sodium diet reviewed in detail, how to read labels. Didn't have good understanding of diet  Seizure disorder - reassured that as long as she isn't having recurrent seizures, current regimen is fine.   Continue to monitor your blood pressure regularly. If you decide to buy your own monitor, I recommend [employer] brand, and also that you schedule a nurse visit at some point (or  just bring the monitor to your next visit) so that we can verify the accuracy of the machine. I recommend reading the labels on your food, specifically the canned, processed and prepared foods.  You should try and eat less than 2000-2500mg  of sodium daily.   Frozen vegetables are usually less sodium then canned; definitely by the lower sodium cans (read the labels). It is okay to resume the Ensure if you aren't  getting your required nutrients from your diet (due to mouth pain).  200mg  isn't excessive.  25 min visit, more than 1/2 spent counseling. F/u 3 months with list of BP's and monitor if purchased  EKG next visit

## 2014-09-10 NOTE — Patient Instructions (Addendum)
Continue to monitor your blood pressure regularly. If you decide to buy your own monitor, I recommend [employer] brand, and also that you schedule a nurse visit at some point (or just bring the monitor to your next visit) so that we can verify the accuracy of the machine. I recommend reading the labels on your food, specifically the canned, processed and prepared foods.  You should try and eat less than 2000-2500mg  of sodium daily.   Frozen vegetables are usually less sodium then canned; definitely by the lower sodium cans (read the labels). It is okay to resume the Ensure if you aren't getting your required nutrients from your diet (due to mouth pain).   isn't excessive.  Choose grilled chicken options over fried/breaded (ie at [employer])  Low-Sodium Eating Plan Sodium raises blood pressure and causes water to be held in the body. Getting less sodium from food will help lower your blood pressure, reduce any swelling, and protect your heart, liver, and kidneys. We get sodium by adding salt (sodium chloride) to food. Most of our sodium comes from canned, boxed, and frozen foods. Restaurant foods, fast foods, and pizza are also very high in sodium. Even if you take medicine to lower your blood pressure or to reduce fluid in your body, getting less sodium from your food is important. WHAT IS MY PLAN? Most people should limit their sodium intake to 2,300 mg a day. Your health care provider recommends that you limit your sodium intake to __________ a day.  WHAT DO I NEED TO KNOW ABOUT THIS EATING PLAN? For the low-sodium eating plan, you will follow these general guidelines:  Choose foods with a % Daily Value for sodium of less than 5% (as listed on the food label).   Use salt-free seasonings or herbs instead of table salt or sea salt.   Check with your health care provider or pharmacist before using salt substitutes.   Eat fresh foods.  Eat more vegetables and fruits.  Limit canned  vegetables. If you do use them, rinse them well to decrease the sodium.   Limit cheese to 1 oz (28 g) per day.   Eat lower-sodium products, often labeled as "lower sodium" or "no salt added."  Avoid foods that contain monosodium glutamate (MSG). MSG is sometimes added to [place] food and some canned foods.  Check food labels (Nutrition Facts labels) on foods to learn how much sodium is in one serving.  Eat more home-cooked food and less restaurant, buffet, and fast food.  When eating at a restaurant, ask that your food be prepared with less salt or none, if possible.  HOW DO I READ FOOD LABELS FOR SODIUM INFORMATION? The Nutrition Facts label lists the amount of sodium in one serving of the food. If you eat more than one serving, you must multiply the listed amount of sodium by the number of servings. Food labels may also identify foods as:  Sodium free--Less than 5 mg in a serving.  Very low sodium--35 mg or less in a serving.  Low sodium--140 mg or less in a serving.  Light in sodium--50% less sodium in a serving. For example, if a food that usually has 300 mg of sodium is changed to become light in sodium, it will have 150 mg of sodium.  Reduced sodium--25% less sodium in a serving. For example, if a food that usually has 400 mg of sodium is changed to reduced sodium, it will have 300 mg of sodium. WHAT FOODS CAN I  EAT? Grains Low-sodium cereals, including oats, puffed wheat and rice, and shredded wheat cereals. Low-sodium crackers. Unsalted rice and pasta. Lower-sodium bread.  Vegetables Frozen or fresh vegetables. Low-sodium or reduced-sodium canned vegetables. Low-sodium or reduced-sodium tomato sauce and paste. Low-sodium or reduced-sodium tomato and vegetable juices.  Fruits Fresh, frozen, and canned fruit. Fruit juice.  Meat and Other Protein Products Low-sodium canned tuna and salmon. Fresh or frozen meat, poultry, seafood, and fish. Lamb. Unsalted nuts. Dried  beans, peas, and lentils without added salt. Unsalted canned beans. Homemade soups without salt. Eggs.  Dairy Milk. Soy milk. Ricotta cheese. Low-sodium or reduced-sodium cheeses. Yogurt.  Condiments Fresh and dried herbs and spices. Salt-free seasonings. Onion and garlic powders. Low-sodium varieties of mustard and ketchup. Lemon juice.  Fats and Oils Reduced-sodium salad dressings. Unsalted butter.  Other Unsalted popcorn and pretzels.  The items listed above may not be a complete list of recommended foods or beverages. Contact your dietitian for more options. WHAT FOODS ARE NOT RECOMMENDED? Grains Instant hot cereals. Bread stuffing, pancake, and biscuit mixes. Croutons. Seasoned rice or pasta mixes. Noodle soup cups. Boxed or frozen macaroni and cheese. Self-rising flour. Regular salted crackers. Vegetables Regular canned vegetables. Regular canned tomato sauce and paste. Regular tomato and vegetable juices. Frozen vegetables in sauces. Salted french fries. Olives. Rosita FirePickles. Relishes. Sauerkraut. Salsa. Meat and Other Protein Products Salted, canned, smoked, spiced, or pickled meats, seafood, or fish. Bacon, ham, sausage, hot dogs, corned beef, chipped beef, and packaged luncheon meats. Salt pork. Jerky. Pickled herring. Anchovies, regular canned tuna, and sardines. Salted nuts. Dairy Processed cheese and cheese spreads. Cheese curds. Blue cheese and cottage cheese. Buttermilk.  Condiments Onion and garlic salt, seasoned salt, table salt, and sea salt. Canned and packaged gravies. Worcestershire sauce. Tartar sauce. Barbecue sauce. Teriyaki sauce. Soy sauce, including reduced sodium. Steak sauce. Fish sauce. Oyster sauce. Cocktail sauce. Horseradish. Regular ketchup and mustard. Meat flavorings and tenderizers. Bouillon cubes. Hot sauce. Tabasco sauce. Marinades. Taco seasonings. Relishes. Fats and Oils Regular salad dressings. Salted butter. Margarine. Ghee. Bacon fat.   Other Potato and tortilla chips. Corn chips and puffs. Salted popcorn and pretzels. Canned or dried soups. Pizza. Frozen entrees and pot pies.  The items listed above may not be a complete list of foods and beverages to avoid. Contact your dietitian for more information. Document Released: 10/01/2001 Document Revised: 04/16/2013 Document Reviewed: 02/13/2013 [facility] Patient Information 2015 [place]ExitCare, [place]. This information is not intended to replace advice given to you by your health care provider. Make sure you discuss any questions you have with your health care provider.   Hypertension Hypertension, commonly called high blood pressure, is when the force of blood pumping through your arteries is too strong. Your arteries are the blood vessels that carry blood from your heart throughout your body. A blood pressure reading consists of a higher number over a lower number, such as 110/72. The higher number (systolic) is the pressure inside your arteries when your heart pumps. The lower number (diastolic) is the pressure inside your arteries when your heart relaxes. Ideally you want your blood pressure below 120/80. Hypertension forces your heart to work harder to pump blood. Your arteries may become narrow or stiff. Having hypertension puts you at risk for heart disease, stroke, and other problems.  RISK FACTORS Some risk factors for high blood pressure are controllable. Others are not.  Risk factors you cannot control include:   Race. You may be at higher risk if you are African American.  Age. Risk increases with age.  Gender. Men are at higher risk than women before age 79 years. After age 665, women are at higher risk than men. Risk factors you can control include:  Not getting enough exercise or physical activity.  Being overweight.  Getting too much fat, sugar, calories, or salt in your diet.  Drinking too much alcohol. SIGNS AND SYMPTOMS Hypertension does not usually cause  signs or symptoms. Extremely high blood pressure (hypertensive crisis) may cause headache, anxiety, shortness of breath, and nosebleed. DIAGNOSIS  To check if you have hypertension, your health care provider will measure your blood pressure while you are seated, with your arm held at the level of your heart. It should be measured at least twice using the same arm. Certain conditions can cause a difference in blood pressure between your right and left arms. A blood pressure reading that is higher than normal on one occasion does not mean that you need treatment. If one blood pressure reading is high, ask your health care provider about having it checked again. TREATMENT  Treating high blood pressure includes making lifestyle changes and possibly taking medicine. Living a healthy lifestyle can help lower high blood pressure. You may need to change some of your habits. Lifestyle changes may include:  Following the DASH diet. This diet is high in fruits, vegetables, and whole grains. It is low in salt, red meat, and added sugars.  Getting at least 2 hours of brisk physical activity every week.  Losing weight if necessary.  Not smoking.  Limiting alcoholic beverages.  Learning ways to reduce stress. If lifestyle changes are not enough to get your blood pressure under control, your health care provider may prescribe medicine. You may need to take more than one. Work closely with your health care provider to understand the risks and benefits. HOME CARE INSTRUCTIONS  Have your blood pressure rechecked as directed by your health care provider.   Take medicines only as directed by your health care provider. Follow the directions carefully. Blood pressure medicines must be taken as prescribed. The medicine does not work as well when you skip doses. Skipping doses also puts you at risk for problems.   Do not smoke.   Monitor your blood pressure at home as directed by your health care  provider. SEEK MEDICAL CARE IF:   You think you are having a reaction to medicines taken.  You have recurrent headaches or feel dizzy.  You have swelling in your ankles.  You have trouble with your vision. SEEK IMMEDIATE MEDICAL CARE IF:  You develop a severe headache or confusion.  You have unusual weakness, numbness, or feel faint.  You have severe chest or abdominal pain.  You vomit repeatedly.  You have trouble breathing. MAKE SURE YOU:   Understand these instructions.  Will watch your condition.  Will get help right away if you are not doing well or get worse. Document Released: 04/11/2005 Document Revised: 08/26/2013 Document Reviewed: 02/01/2013 [facility]ExitCare Patient Information 2015 [place]ExitCare, [place]LLC. This information is not intended to replace advice given to you by your health care provider. Make sure you discuss any questions you have with your health care provider.

## 2014-10-01 ENCOUNTER — Other Ambulatory Visit: Payer: Self-pay | Admitting: Family Medicine

## 2014-10-01 NOTE — Telephone Encounter (Signed)
Is this okay to refill? 

## 2014-11-10 DIAGNOSIS — H40013 Open angle with borderline findings, low risk, bilateral: Secondary | ICD-10-CM | POA: Diagnosis not present

## 2014-11-21 DIAGNOSIS — H40013 Open angle with borderline findings, low risk, bilateral: Secondary | ICD-10-CM | POA: Diagnosis not present

## 2014-11-21 DIAGNOSIS — H3531 Nonexudative age-related macular degeneration: Secondary | ICD-10-CM | POA: Diagnosis not present

## 2014-11-21 DIAGNOSIS — H2513 Age-related nuclear cataract, bilateral: Secondary | ICD-10-CM | POA: Diagnosis not present

## 2014-11-21 DIAGNOSIS — H25013 Cortical age-related cataract, bilateral: Secondary | ICD-10-CM | POA: Diagnosis not present

## 2014-11-21 DIAGNOSIS — H04123 Dry eye syndrome of bilateral lacrimal glands: Secondary | ICD-10-CM | POA: Diagnosis not present

## 2014-11-21 DIAGNOSIS — H579 Unspecified disorder of eye and adnexa: Secondary | ICD-10-CM

## 2014-11-21 HISTORY — DX: Unspecified disorder of eye and adnexa: H57.9

## 2014-12-01 ENCOUNTER — Encounter: Payer: Self-pay | Admitting: *Deleted

## 2014-12-17 ENCOUNTER — Encounter: Payer: Medicare Other | Admitting: Family Medicine

## 2014-12-23 DIAGNOSIS — H2511 Age-related nuclear cataract, right eye: Secondary | ICD-10-CM | POA: Diagnosis not present

## 2015-01-19 ENCOUNTER — Encounter: Payer: Self-pay | Admitting: Family Medicine

## 2015-01-19 ENCOUNTER — Ambulatory Visit (INDEPENDENT_AMBULATORY_CARE_PROVIDER_SITE_OTHER): Payer: Medicare Other | Admitting: Family Medicine

## 2015-01-19 VITALS — BP 140/80 | HR 64 | Ht 62.0 in | Wt 118.8 lb

## 2015-01-19 DIAGNOSIS — R030 Elevated blood-pressure reading, without diagnosis of hypertension: Secondary | ICD-10-CM | POA: Diagnosis not present

## 2015-01-19 DIAGNOSIS — Z23 Encounter for immunization: Secondary | ICD-10-CM | POA: Diagnosis not present

## 2015-01-19 DIAGNOSIS — G40909 Epilepsy, unspecified, not intractable, without status epilepticus: Secondary | ICD-10-CM | POA: Diagnosis not present

## 2015-01-19 DIAGNOSIS — R21 Rash and other nonspecific skin eruption: Secondary | ICD-10-CM | POA: Diagnosis not present

## 2015-01-19 DIAGNOSIS — E559 Vitamin D deficiency, unspecified: Secondary | ICD-10-CM

## 2015-01-19 DIAGNOSIS — M810 Age-related osteoporosis without current pathological fracture: Secondary | ICD-10-CM

## 2015-01-19 DIAGNOSIS — IMO0001 Reserved for inherently not codable concepts without codable children: Secondary | ICD-10-CM

## 2015-01-19 NOTE — Progress Notes (Signed)
Chief Complaint  Patient presents with  . Follow-up    med check on BP. Would like a new rx for [employer], she likes to keep on hand for when she needs for itchy rashes.    Since she had cataract surgery on her right eye she hasn't been back to [facility].  She hasn't exercised in about a month, and hasn't checked her BP.  She wasn't told what her BP was when at the eye doctor's office.  She denies any headaches or dizziness.  She is trying to follow a low sodium diet. She still eats canned foods--gets low sodium and rinses them.  Denies headaches, dizziness, chest pain, shortness of breath.  Osteoporosis: She drinks  2-3 glasses of milk daily. Doesn't eat yogurt or cheese, not much greens (just beans, pinto). She has declined any treatment for osteoporosis in the past, refusing to take any more medications.  Has some recurrent mouth/lip pain only with acidic foods.  Overall, her mouth is much better. She hasn't had any seizures since being changed to just the Keppra alone.  PMH, PSH, SH reviewed and updated.  Outpatient Encounter Prescriptions as of 01/19/2015  Medication Sig Note  . cholecalciferol (VITAMIN D) 1000 UNITS tablet Take 1,000 Units by mouth daily.   . diphenhydramine-acetaminophen (TYLENOL PM) 25-500 MG TABS Take 1 tablet by mouth at bedtime as needed. Takes most nights   . levETIRAcetam (KEPPRA) 500 MG tablet TAKE 1 TABLET BY MOUTH TWICE DAILY *NDC [phone]*   . acetaminophen (TYLENOL) 325 MG tablet Take 325 mg by mouth every 6 (six) hours as needed.   . hydrocortisone valerate cream (WESTCORT) 0.2 % APPLY TO AFFECTED AREA TWICE A DAY FOR ITCHY RASH   . [DISCONTINUED] hydrocortisone valerate cream (WESTCORT) 0.2 % APPLY TO AFFECTED AREA TWICE A DAY FOR ITCHY RASH (Patient not taking: Reported on 01/19/2015) 01/19/2015: She ran out and wants refill  . [DISCONTINUED] lidocaine (XYLOCAINE) 2 % solution TAKE 1 TEASPOONFUL 3 TIMES A DAY AS NEEDED FOR MOUTH PAIN (Patient not taking:  Reported on 09/10/2014)   . [DISCONTINUED] triamcinolone (KENALOG) 0.1 % paste Use as directed 1 application in the mouth or throat 2 (two) times daily. Use as needed for irritation/sores (Patient not taking: Reported on 08/13/2014)    No facility-administered encounter medications on file as of 01/19/2015.   Allergies  Allergen Reactions  . Sulfa Antibiotics Other (See Comments)    unknown   ROS:  No fever, chills, URI or allergy symptoms, headaches, dizziness, chest pain, shortness of breath, GI or GU complaints. No bleeding, bruising, rashes, depression. Vision improved since surgery. No seizures or other neuro complaints.  PHYSICAL EXAM: BP 140/80 mmHg  Pulse 64  Ht  (1.575 m)  Wt 118 lb 12.8 oz (53.887 kg)  BMI 21.72 kg/m2  144/78 on repeat by MD Pleasant, well-appearing female who appears somewhat younger than stated age HEENT: PERRL, EOMI, conjunctiva clear. OP clear Neck: no lymphadenopathy, thyromegaly or bruit Heart: regular rate and rhythm Lungs: clear bilaterally Abdomen: soft, nontender, no mass Extremities: no edema, 2+ pulses Neuro: alert and oriented. Cranial nerves intact. Normal strength, gait Psych: normal mood, affect, hygiene and grooming  EKG:  NSR, rate 66. No LVH or other abnormality noted.  ASSESSMENT/PLAN  Elevated blood pressure - Baseline EKG, and eval for LVH; monitor elsewhere and f/u if persistently >140/90. Low sodium diet, regular exercise. - Plan: EKG 12-Lead  Need for prophylactic vaccination and inoculation against influenza - Plan: Flu vaccine HIGH DOSE  PF (Fluzone High dose)  Seizure disorder - stable on Keppra  Vitamin D deficiency  Osteoporosis - refuses meds; risks reviewed. discussed calcium recs, vitamin D and weight-bearing exercise.  Rash and nonspecific skin eruption - no current problems; requested to refill of steroid to have prn. - Plan: hydrocortisone valerate cream (WESTCORT) 0.2 %   She refuses medications for  osteoporosis. Reviewed risks of untreated osteoporosis in detail, and she still declines.   Please either drink more milk (at least 3-4 servings/day) or take a calcium supplement such that your total dietary intake of calcium is 1200-1500mg  daily.  8 ounces of milk is about  of calcium. Tums or other calcium supplements are fine. Don't take all  in tablet form, just to make the total amount equal 1200-1500 (or there is increased risk for complications including kidney stones). Make sure you get weight bearing exercise at least 2 times/week, for both upper and lower body.   F/u in April for med check +/AWV

## 2015-01-19 NOTE — Patient Instructions (Signed)
It is recommended that you get at least 30 minutes of aerobic exercise at least 5 days/week (for weight loss, you may need as much as 60-90 minutes). This can be any activity that gets your heart rate up. This can be divided in 10-15 minute intervals if needed, but try and build up your endurance at least once a week.  Weight bearing exercise is also recommended twice weekly.  Continue to follow a low sodium diet. Check your blood pressure periodically. Normal is <120/70. If you are <135/85, that is borderline.  If consistently >140/90 then medications are indicated.  Return if they are that high regularly.  Bring your list of blood pressures to your next visit.

## 2015-01-20 MED ORDER — HYDROCORTISONE VALERATE 0.2 % EX CREA: TOPICAL_CREAM | CUTANEOUS | Status: DC

## 2015-01-21 DIAGNOSIS — H25012 Cortical age-related cataract, left eye: Secondary | ICD-10-CM | POA: Diagnosis not present

## 2015-01-21 DIAGNOSIS — H2512 Age-related nuclear cataract, left eye: Secondary | ICD-10-CM | POA: Diagnosis not present

## 2015-02-17 DIAGNOSIS — H2512 Age-related nuclear cataract, left eye: Secondary | ICD-10-CM | POA: Diagnosis not present

## 2015-06-22 ENCOUNTER — Encounter: Payer: Self-pay | Admitting: Family Medicine

## 2015-06-22 ENCOUNTER — Ambulatory Visit (INDEPENDENT_AMBULATORY_CARE_PROVIDER_SITE_OTHER): Payer: Medicare Other | Admitting: Family Medicine

## 2015-06-22 VITALS — BP 142/84 | HR 72 | Ht 62.0 in | Wt 120.2 lb

## 2015-06-22 DIAGNOSIS — Z8711 Personal history of peptic ulcer disease: Secondary | ICD-10-CM | POA: Diagnosis not present

## 2015-06-22 DIAGNOSIS — M7711 Lateral epicondylitis, right elbow: Secondary | ICD-10-CM | POA: Diagnosis not present

## 2015-06-22 MED ORDER — MELOXICAM 7.5 MG PO TABS: 7.5000 mg | ORAL_TABLET | Freq: Every day | ORAL | Status: DC

## 2015-06-22 NOTE — Progress Notes (Signed)
Chief Complaint  Patient presents with  . Elbow Pain    right elbow pain x 2-3 months. Has been using theragesic, taking tylenol from time to time but she thinks to topical works better.    She is complaining of right elbow pain x 2-3 months.  She uses 2# weights and resistance bands at the gym, no significant discomfort while lifting the weights, but that evening notices pain at the bone on the lateral right elbow.  Theragesic and other topical medications have been helpful.  Denies any swelling, redness. Denies any known injury, trauma or new activities.  H/o left sided frozen shoulder in the 90's. H/o PUD 1980  PMH, PSH, SH reviewed.  Current Outpatient Prescriptions on File Prior to Visit  Medication Sig Dispense Refill  . acetaminophen (TYLENOL) 325 MG tablet Take 325 mg by mouth every 6 (six) hours as needed.    . cholecalciferol (VITAMIN D) 1000 UNITS tablet Take 1,000 Units by mouth daily.    . diphenhydramine-acetaminophen (TYLENOL PM) 25-500 MG TABS Take 1 tablet by mouth at bedtime as needed. Takes most nights    . levETIRAcetam (KEPPRA) 500 MG tablet TAKE 1 TABLET BY MOUTH TWICE DAILY *NDC ([phone]* 180 tablet 2  . hydrocortisone valerate cream (WESTCORT) 0.2 % APPLY TO AFFECTED AREA TWICE A DAY FOR ITCHY RASH (Patient not taking: Reported on 06/22/2015) 45 g 0   No current facility-administered medications on file prior to visit.   Allergies  Allergen Reactions  . Sulfa Antibiotics Other (See Comments)    unknown   ROS: no fever, chills, numbness, tingling, weakness, swelling, URI symptoms, cough, shortness of breath, chest pain, palpitations nausea, vomiting, bowel changes, bleeding, bruising, rash or other complaints.  No seizures.  PHYSICAL EXAM: BP 142/84 mmHg  Pulse 72  Ht  (1.575 m)  Wt 120 lb 3.2 oz (54.522 kg)  BMI 21.98 kg/m2   Well developed, pleasant female in no distress She is tender over the right lateral epicondyle.  No erythema, warmth,  swelling.   FROM at the elbow. She has 4- strength throughout, bilaterally. No pain with any particular motion, very slight discomfort with all, equally (no different with wrist flexion/extension or supination/pronation, minimal discomfort with all). Normal pulses, brisk capillary refill Skin is normal, intact  ASSESSMENT/PLAN:  Epicondylitis, lateral (tennis elbow), right - Plan: meloxicam (MOBIC) 7.5 MG tablet  History of peptic ulcer   Avoid lifting weights, resistance bands or any heavy lifting or repetitive activity with the right arm until this has resolved (10-14 days). Consider using a tennis elbow strap (you can get at the pharmacy) if any recurrent discomfort returns once you start back with the weights at the gym. Ice the elbow after going to the gym if/when any pain recurs.  Start taking the meloxicam once daily with food. Take it daily until you feel you 100% better (this could be a week, 10 days, or the full 15 days worth of the prescription).  You don't have to finish the entire prescription if your symptoms completely resolve sooner. Take omeprazole once daily when taking the meloxicam in order to prevent ulcers, given your history of stomach ulcer. If you develop any abdominal discomfort, stop taking the medication and let us know.

## 2015-06-22 NOTE — Patient Instructions (Addendum)
Avoid lifting weights, resistance bands or any heavy lifting or repetitive activity with the right arm until this has resolved (10-14 days). Consider using a tennis elbow strap (you can get at the pharmacy) if any recurrent discomfort returns once you start back with the weights at the gym. Ice the elbow after going to the gym if/when any pain recurs.  Start taking the meloxicam once daily with food. Take it daily until you feel you 100% better (this could be a week, 10 days, or the full 15 days worth of the prescription).  You don't have to finish the entire prescription if your symptoms completely resolve sooner. Take omeprazole once daily when taking the meloxicam in order to prevent ulcers, given your history of stomach ulcer. (you can get Prilosec OTC  without a prescription, only need to get 2 week supply, not a long-term medication).  If this is very expensive, we can try to see if your insurance covers a prescription. If you develop any abdominal discomfort, stop taking the medication and let us know.     Tennis Elbow Tennis elbow (lateral epicondylitis) is inflammation of the outer tendons of your forearm close to your elbow. Your tendons attach your muscles to your bones. The outer tendons of your forearm are used to extend your wrist, and they attach on the outside part of your elbow. Tennis elbow is often found in people who play tennis, but anyone may get the condition from repeatedly extending the wrist or turning the forearm. CAUSES This condition is caused by repeatedly extending your wrist and using your hands. It can result from sports or work that requires repetitive forearm movements. Tennis elbow may also be caused by an injury. RISK FACTORS You have a higher risk of developing tennis elbow if you play tennis or another racquet sport. You also have a higher risk if you frequently use your hands for work. This condition is also more likely to develop  in:  Musicians.  Carpenters, painters, and plumbers.  Cooks.  Cashiers.  People who work in [employer].  Holiday representative workers.  Butchers.  People who use computers. SYMPTOMS Symptoms of this condition include:  Pain and tenderness in your forearm and the outer part of your elbow. You may only feel the pain when you use your arm, or you may feel it even when you are not using your arm.  A burning feeling that runs from your elbow through your arm.  Weak grip in your hands. DIAGNOSIS  This condition may be diagnosed by medical history and physical exam. You may also have other tests, including:  X-rays.  MRI. TREATMENT Your health care provider will recommend lifestyle adjustments, such as resting and icing your arm. Treatment may also include:  Medicines for inflammation. This may include shots of cortisone if your pain continues.  Physical therapy. This may include massage or exercises.  An elbow brace. Surgery may eventually be recommended if your pain does not go away with treatment. HOME CARE INSTRUCTIONS Activity  Rest your elbow and wrist as directed by your health care provider. Try to avoid any activities that caused the problem until your health care provider says that you can do them again.  If a physical therapist teaches you exercises, do all of them as directed.  If you lift an object, lift it with your palm facing upward. This lowers the stress on your elbow. Lifestyle  If your tennis elbow is caused by sports, check your equipment and make sure that:  You are using it correctly.  It is the best fit for you.  If your tennis elbow is caused by work, take breaks frequently, if you are able. Talk with your manager about how to best perform tasks in a way that is safe.  If your tennis elbow is caused by computer use, talk with your manager about any changes that can be made to your work environment. General Instructions  If directed, apply ice to  the painful area:  Put ice in a plastic bag.  Place a towel between your skin and the bag.  Leave the ice on for 20 minutes, 2-3 times per day.  Take medicines only as directed by your health care provider.  If you were given a brace, wear it as directed by your health care provider.  Keep all follow-up visits as directed by your health care provider. This is important. SEEK MEDICAL CARE IF:  Your pain does not get better with treatment.  Your pain gets worse.  You have numbness or weakness in your forearm, hand, or fingers.   This information is not intended to replace advice given to you by your health care provider. Make sure you discuss any questions you have with your health care provider.   Document Released: 04/11/2005 Document Revised: 08/26/2014 Document Reviewed: 04/07/2014 Else[employer] Interactive Patient Education [employer]

## 2015-06-24 ENCOUNTER — Other Ambulatory Visit: Payer: Self-pay | Admitting: Family Medicine

## 2015-08-19 ENCOUNTER — Encounter: Payer: Medicare Other | Admitting: Family Medicine

## 2015-09-09 ENCOUNTER — Ambulatory Visit (INDEPENDENT_AMBULATORY_CARE_PROVIDER_SITE_OTHER): Payer: Medicare Other | Admitting: Family Medicine

## 2015-09-09 ENCOUNTER — Encounter: Payer: Self-pay | Admitting: Family Medicine

## 2015-09-09 VITALS — BP 150/90 | HR 72 | Ht 61.5 in | Wt 118.2 lb

## 2015-09-09 DIAGNOSIS — IMO0001 Reserved for inherently not codable concepts without codable children: Secondary | ICD-10-CM

## 2015-09-09 DIAGNOSIS — Z5181 Encounter for therapeutic drug level monitoring: Secondary | ICD-10-CM | POA: Diagnosis not present

## 2015-09-09 DIAGNOSIS — Z01419 Encounter for gynecological examination (general) (routine) without abnormal findings: Secondary | ICD-10-CM

## 2015-09-09 DIAGNOSIS — G40909 Epilepsy, unspecified, not intractable, without status epilepticus: Secondary | ICD-10-CM

## 2015-09-09 DIAGNOSIS — R5383 Other fatigue: Secondary | ICD-10-CM | POA: Diagnosis not present

## 2015-09-09 DIAGNOSIS — M810 Age-related osteoporosis without current pathological fracture: Secondary | ICD-10-CM | POA: Diagnosis not present

## 2015-09-09 DIAGNOSIS — I10 Essential (primary) hypertension: Secondary | ICD-10-CM | POA: Insufficient documentation

## 2015-09-09 DIAGNOSIS — R030 Elevated blood-pressure reading, without diagnosis of hypertension: Secondary | ICD-10-CM

## 2015-09-09 DIAGNOSIS — Z0000 Encounter for general adult medical examination without abnormal findings: Secondary | ICD-10-CM | POA: Diagnosis not present

## 2015-09-09 LAB — COMPREHENSIVE METABOLIC PANEL
ALT: 11 U/L (ref 6–29)
AST: 19 U/L (ref 10–35)
Albumin: 4.3 g/dL (ref 3.6–5.1)
Alkaline Phosphatase: 29 U/L — ABNORMAL LOW (ref 33–130)
BILIRUBIN TOTAL: 0.4 mg/dL (ref 0.2–1.2)
BUN: 12 mg/dL (ref 7–25)
CO2: 26 mmol/L (ref 20–31)
CREATININE: 0.77 mg/dL (ref 0.60–0.88)
Calcium: 9.5 mg/dL (ref 8.6–10.4)
Chloride: 96 mmol/L — ABNORMAL LOW (ref 98–110)
GLUCOSE: 100 mg/dL — AB (ref 65–99)
Potassium: 4.3 mmol/L (ref 3.5–5.3)
SODIUM: 133 mmol/L — AB (ref 135–146)
Total Protein: 7.1 g/dL (ref 6.1–8.1)

## 2015-09-09 LAB — CBC WITH DIFFERENTIAL/PLATELET
BASOS ABS: 0 {cells}/uL (ref 0–200)
Basophils Relative: 0 %
EOS ABS: 70 {cells}/uL (ref 15–500)
Eosinophils Relative: 1 %
HCT: 40.3 % (ref 35.0–45.0)
Hemoglobin: 13.2 g/dL (ref 11.7–15.5)
LYMPHS PCT: 19 %
Lymphs Abs: 1330 cells/uL (ref 850–3900)
MCH: 27.7 pg (ref 27.0–33.0)
MCHC: 32.8 g/dL (ref 32.0–36.0)
MCV: 84.5 fL (ref 80.0–100.0)
MONOS PCT: 6 %
MPV: 9.8 fL (ref 7.5–12.5)
Monocytes Absolute: 420 cells/uL (ref 200–950)
Neutro Abs: 5180 cells/uL (ref 1500–7800)
Neutrophils Relative %: 74 %
Platelets: 228 10*3/uL (ref 140–400)
RBC: 4.77 MIL/uL (ref 3.80–5.10)
RDW: 15.2 % — AB (ref 11.0–15.0)
WBC: 7 10*3/uL (ref 4.0–10.5)

## 2015-09-09 MED ORDER — LEVETIRACETAM 500 MG PO TABS: ORAL_TABLET | ORAL | Status: DC

## 2015-09-09 NOTE — Progress Notes (Signed)
Chief Complaint  Patient presents with  . Medicare Wellness    fasting AWV/med check with pelvic-has pap 4/16. No concerns.    Alicia [name] is a 82 y.o. female who presents for annual wellness visit and follow-up on chronic medical conditions.  She has the following concerns:  She was last seen in February with R elbow pain, dx'd with lateral epicondylitis. Treated with meloxicam. Her pain has completely resolved.  Osteoporosis: She drinks 2-3 glasses of milk daily.  She has declined any treatment for osteoporosis in the past, refusing to take any more medications.  Has some recurrent mouth/lip pain only with acidic foods. Overall, her mouth is much better. She hasn't had any seizures since being changed to just the Keppra alone (lamictal stopped). Last seizure was probably in 1998.  History of elevated blood pressures:  She tries to follow a low sodium diet, and gets regular exercise. Sometimes she checks her BP's at the gym, recalls it being in the 140's. She didn't bring in the card that she writes the values on.  Denies chest pain, palpitations, shortness of breath.  She has headaches and runny nose in the mornings, takes an Financial trader plus.  No headaches later in the day.   Immunization History  Administered Date(s) Administered  . Influenza Split 03/01/2012  . Influenza, High Dose Seasonal PF 01/19/2014, 01/19/2015  . Influenza-Unspecified 12/24/2012  . Pneumococcal Conjugate-13 08/13/2014  . Pneumococcal Polysaccharide-23 04/26/1999, 09/01/2006  . Td 09/01/2006  . Tdap 07/25/2011   Last Pap smear: 07/2014, normal, with no high risk HPV present Last mammogram: 12/2013 Last colonoscopy: Per Dr. Milinda Pointer first note at [facility], said 2 years prior (so would have been approx 2006) --over 10 years ago per pt now. Last DEXA: 03/2010 (shows osteoporosis, but pt refuses treatment) Ophtho: goes yearly  Dentist: once a year, usually, hasn't gone to the dentist since hers  retired. [facility]: [facility], does [employer] classes (includes weights).  She does the treadmill occasionally. No exercise outside of going to the gym EKG normal 12/2014  Lipids: Lab Results  Component Value Date   CHOL 203* 08/13/2014   HDL 100 08/13/2014   LDLCALC 86 08/13/2014   TRIG 87 08/13/2014   CHOLHDL 2.0 08/13/2014   Vitamin D level 07/2011: 59 (on same supplements)  Other doctors caring for patient include: Dentist: Dr. Derrill Kay (retired--looking for new one) ophtho--Dr. Frederico Hamman at [facility] for glasses, Dr. Elmer Picker for cataracts Neuro: at [facility] (last seen 07/2013) Dr. Daneen Schick   Depression screen: negative Fall screen: negative Functional status screen:  Notable for some hearing loss, slight memory concern (might have trouble remembering something from a long time ago, no other memory concerns), +urinary leakage. See full screen in Epic.  End of Life Discussion:  Patient has a living will and medical power of attorney  Past Medical History  Diagnosis Date  . Osteoporosis     DEXA 03/2010 T-3.2 R hip; declines meds  . Vitamin D deficiency   . GERD (gastroesophageal reflux disease)   . Tinnitus 05/2009    DR BATES--related to change in med/generic.  Resolved  . Seizure disorder (HCC) 1997    evaluated by [facility] in past  . PUD (peptic ulcer disease) 1980  . Hyperlipidemia   . Restless leg syndrome   . Hx of migraines   . Macular degeneration   . Seizures (HCC)   . Eye exam abnormal 11/21/14    Dr.Hecker-glaucoma suspect, cataracts,  hemorrage    Past Surgical History  Procedure Laterality Date  . Tonsillectomy    . Tubal ligation    . Cataract extraction Bilateral 11/2014, 01/2015    Dr. Elmer PickerHecker    Social History   Social History  . Marital Status: Widowed    Spouse Name: N/A  . Number of Children: 2  . Years of Education: N/A   Occupational History  . retired (from [employer])    Social History Main  Topics  . Smoking status: Never Smoker   . Smokeless tobacco: Never Used  . Alcohol Use: No  . Drug Use: No  . Sexual Activity: Not Currently   Other Topics Concern  . Not on file   Social History Narrative   Lives alone (in a one-level townhome).  Widowed 2009.  [place], 1 daughter in [place]. 5 grandchildren    Family History  Problem Relation Age of Onset  . Heart disease Mother   . Dementia Father   . Alzheimer's disease Sister   . Diabetes Neg Hx   . Cancer Neg Hx   . Thyroid disease Sister     removed, thinks benign    Outpatient Encounter Prescriptions as of 09/09/2015  Medication Sig  . cholecalciferol (VITAMIN D) 1000 UNITS tablet Take 1,000 Units by mouth daily.  . diphenhydramine-acetaminophen (TYLENOL PM) 25-500 MG TABS Take 1 tablet by mouth at bedtime as needed. Takes most nights  . levETIRAcetam (KEPPRA) 500 MG tablet TAKE 1 TABLET BY MOUTH TWICE DAILY *NDC [phone]*  . acetaminophen (TYLENOL) 325 MG tablet Take 325 mg by mouth every 6 (six) hours as needed. Reported on 09/09/2015  . hydrocortisone valerate cream (WESTCORT) 0.2 % APPLY TO AFFECTED AREA TWICE A DAY FOR ITCHY RASH (Patient not taking: Reported on 06/22/2015)  . [DISCONTINUED] meloxicam (MOBIC) 7.5 MG tablet Take 1 tablet (7.5 mg total) by mouth daily.   No facility-administered encounter medications on file as of 09/09/2015.    Allergies  Allergen Reactions  . Sulfa Antibiotics Other (See Comments)    unknown    ROS: The patient denies anorexia, fever, weight changes, headaches, seizures, vision changes, ear pain, sore throat, breast concerns, chest pain, palpitations, dizziness, syncope, dyspnea on exertion, cough, swelling, nausea, vomiting, diarrhea, constipation, abdominal pain, melena, hematochezia, indigestion/heartburn (infrequent), vaginal bleeding, discharge, odor or itch, genital lesions, numbness, tingling, weakness, tremor, suspicious skin lesions, depression, abnormal  bleeding/bruising, or enlarged lymph nodes.  Occasional heartburn with spicy food (relieved by pepcid or mylanta as needed). +urinary incontinence (chronic), wears pads. Denies dysuria; not interested in medications. Mild sniffling and runny nose from allergies in the Spring, mostly in the morning she is bothered with runny nose. ringing in R ear, intermittent, and some hearing loss also noted in the right. Occasionally has trouble trying to remember something from the past--infrequent. Swelling in legs, L>R, unchanged/chronic. Some leg pain at night.   PHYSICAL EXAM:  BP 152/90 mmHg  Pulse 72  Ht 5' 1.5" (1.562 m)  Wt 118 lb 3.2 oz (53.615 kg)  BMI 21.97 kg/m2  General Appearance:  Alert, cooperative, no distress, appears stated age. Slightly anxious/excitable  Head:  Normocephalic, without obvious abnormality, atraumatic   Eyes:  PERRL, conjunctiva/corneas clear, EOM's intact, fundi  benign   Ears:  Normal TM's and external ear canals   Nose:  Nares normal, mucosa normal, pale, no drainage or sinus tenderness   Throat:  Tongue and teeth are normal.Mucosa is normal  Neck:  Supple, no lymphadenopathy; thyroid:  no enlargement/tenderness/nodules; no carotid  bruit or JVD   Back:  Spine nontender, no curvature, ROM normal, no CVA tenderness   Lungs:  Clear to auscultation bilaterally without wheezes, rales or ronchi; respirations unlabored   Chest Wall:  No tenderness or deformity   Heart:  Regular rate and rhythm, S1 and S2 normal, no murmur, rub  or gallop   Breast Exam:  No tenderness, masses, or nipple discharge or inversion. No axillary lymphadenopathy   Abdomen:  Soft, non-tender, nondistended, normoactive bowel sounds,  no masses, no hepatosplenomegaly   Genitalia:  Normal external genitalia--some irritation at pubic area (from adhesive from pad). Some atrophic changes noted, no lesions. No abnormal vaginal  discharge.No cervical motion tenderness, no abnormal vaginal discharge. Uterus and adnexa are normal, nontender, no masses  Rectal:  Normal sphincter tone, no masses. Heme negative stool.  Extremities:  No clubbing, cyanosis or edema   Pulses:  2+ and symmetric all extremities   Skin:  Skin color, texture, turgor normal, no rashes or lesions   Lymph nodes:  Cervical, supraclavicular, and axillary nodes normal   Neurologic:  CNII-XII intact, normal strength, sensation and gait; reflexes 2+ and symmetric throughout    Psych: Normal mood, affect, hygiene and grooming                ASSESSMENT/PLAN:   Medicare annual wellness visit, subsequent  Seizure disorder (HCC) - controlled with Keppra, continue - Plan: levETIRAcetam (KEPPRA) 500 MG tablet  Osteoporosis - discussed calcium recommendations, vitamin D, weight-bearing exercise. Encouraged meds, consider Prolia injections - Plan: TSH  Medication monitoring encounter - Plan: Comprehensive metabolic panel, CBC with Differential/Platelet, TSH  Other fatigue - Plan: Comprehensive metabolic panel, CBC with Differential/Platelet, TSH  Elevated blood pressure - counseled re: sodium, exercise. monitor at gym and return with list of BP's. risks of untreated HTN reviewed.   Elevated blood pressure--counseled re: low sodium diet, checking elsewhere to r/o white coat component. Daily exercise encouraged.  Osteoporosis: pt refuses treatment. Risks of untreated osteoporosis reviewed. reviewed dietary calcium sources (refuses pills); continue Vitamin D   Discussed monthly self breast exams and yearly mammograms; at least 30 minutes of aerobic activity at least 5 days/week and weight-bearing exercise 2x/week; proper sunscreen use reviewed; healthy diet, including goals of calcium and vitamin D intake and alcohol recommendations (less than or equal to 1 drink/day) reviewed; regular seatbelt use;  changing batteries in smoke detectors. Immunization recommendations discussed-zostavax recommended.  Risks/side effects/benefits reviewed in detail. Written rx given to patient to get at pharmacy. Continue yearly flu shots. Colonoscopy recommendations reviewed--past due. She doesn't recall who did her last colonoscopy. At this point, I recommend Cologard, and colonoscopy only if abnormal.  Patient is agreeable to doing Cologard, will refer.   Please continue to follow a low sodium diet. Try and exercise at least 150 minutes/week of aerobic exercise--either go to the gym an extra day, or try getting some exercise at home 15 minutes twice daily. Please monitor your blood pressure regularly.  If at the gym, check it at 15 minutes after relaxing after exercise.  If it is high, wait a few minutes and recheck it.  Write it on a piece of paper, and bring this paper to your next visit for my review. Normal is 120-130 or less/80. If it is consistently 140/90 or higher, that could cause health problems for you.  Avoid decongestants, as these can raise blood pressure.  Look at the alka selzer medication you are taking, as this may  have a "sinus med" "decongestant".   Instead, use claritin or allegra or zyrtec daily. You may also use a nasal steroid spray such as Flonase every morning (this needs to be taken every day during allergy season, not sporadically)  Please check at the pharmacy regarding shingles vaccine (take the written prescription you were given).  I recommend this vaccine (just once).  We are referring you to do the Cologard testing for colon cancer.  If this test is normal, we do it again in 3 years.  If it is abnormal, we refer you to a gastroenterologist for colonoscopy.  Medicare Attestation I have personally reviewed: The patient's medical and social history Their use of alcohol, tobacco or illicit drugs Their current medications and supplements The patient's functional ability  including ADLs,fall risks, home safety risks, cognitive, and hearing and visual impairment Diet and physical activities Evidence for depression or mood disorders  The patient's weight, height, and BMI  have been recorded in the chart.  I have made referrals, counseling, and provided education to the patient based on review of the above and I have provided the patient with a written personalized care plan for preventive services.    MOST form reviewed and updated.  Full Code, Full care    Rylyn Zawistowski A, MD   09/09/2015

## 2015-09-09 NOTE — Patient Instructions (Addendum)
HEALTH MAINTENANCE RECOMMENDATIONS:  It is recommended that you get at least 30 minutes of aerobic exercise at least 5 days/week (for weight loss, you may need as much as 60-90 minutes). This can be any activity that gets your heart rate up. This can be divided in 10-15 minute intervals if needed, but try and build up your endurance at least once a week.  Weight bearing exercise is also recommended twice weekly.  Eat a healthy diet with lots of vegetables, fruits and fiber.  "Colorful" foods have a lot of vitamins (ie green vegetables, tomatoes, red peppers, etc).  Limit sweet tea, regular sodas and alcoholic beverages, all of which has a lot of calories and sugar.  Up to 1 alcoholic drink daily may be beneficial for women (unless trying to lose weight, watch sugars).  Drink a lot of water.  Calcium recommendations are 1200-1500 mg daily (1500 mg for postmenopausal women or women without ovaries), and vitamin D 1000 IU daily.  This should be obtained from diet and/or supplements (vitamins), and calcium should not be taken all at once, but in divided doses.  Monthly self breast exams and yearly mammograms for women over the age of 80 is recommended.  Sunscreen of at least SPF 30 should be used on all sun-exposed parts of the skin when outside between the hours of 10 am and 4 pm (not just when at beach or pool, but even with exercise, golf, tennis, and yard work!)  Use a sunscreen that says "broad spectrum" so it covers both UVA and UVB rays, and make sure to reapply every 1-2 hours.  Remember to change the batteries in your smoke detectors when changing your clock times in the spring and fall.  Use your seat belt every time you are in a car, and please drive safely and not be distracted with cell phones and texting while driving.    Ms. Alicia [name] , Thank you for taking time to come for your Medicare Wellness Visit. I appreciate your ongoing commitment to your health goals. Please review the  following plan we discussed and let me know if I can assist you in the future.   These are the goals we discussed: Goals    None      This is a list of the screening recommended for you and due dates:  Health Maintenance  Topic Date Due  . Shingles Vaccine  08/24/1995  . Flu Shot  11/24/2015  . Tetanus Vaccine  07/24/2021  . DEXA scan (bone density measurement)  Completed  . Pneumonia vaccines  Completed   Bone density test is recommended to be repeated if we are treating the osteoporosis.  Since you elected not to treat it, there is no point in showing further decline (weakness in the bone).  Please consider bone-strengthening medications such as Boniva, Prolia, or Reclast. Continue your calcium and vitamin D (diet/supplements).  You are past due for mammogram--I recommend these yearly.  Please call to schedule.    Please continue to follow a low sodium diet. Try and exercise at least 150 minutes/week of aerobic exercise--either go to the gym an extra day, or try getting some exercise at home 15 minutes twice daily. Please monitor your blood pressure regularly.  If at the gym, check it at 15 minutes after relaxing after exercise.  If it is high, wait a few minutes and recheck it.  Write it on a piece of paper, and bring this paper to your next visit for my review. Normal  is 120-130 or less/80. If it is consistently 140/90 or higher, that could cause health problems for you.  Avoid decongestants, as these can raise blood pressure.  Look at the alka selzer medication you are taking, as this may have a "sinus med" "decongestant".   Instead, use claritin or allegra or zyrtec daily. You may also use a nasal steroid spray such as Flonase every morning (this needs to be taken every day during allergy season, not sporadically)   Please check at the pharmacy regarding shingles vaccine (take the written prescription you were given).  I recommend this vaccine (just once).  We are referring  you to do the Cologard testing for colon cancer.  If this test is normal, we do it again in 3 years.  If it is abnormal, we refer you to a gastroenterologist for colonoscopy.

## 2015-09-10 LAB — TSH: TSH: 1.21 m[IU]/L

## 2015-09-14 ENCOUNTER — Encounter: Payer: Self-pay | Admitting: Family Medicine

## 2015-09-15 DIAGNOSIS — Z1211 Encounter for screening for malignant neoplasm of colon: Secondary | ICD-10-CM | POA: Diagnosis not present

## 2015-09-15 DIAGNOSIS — Z1212 Encounter for screening for malignant neoplasm of rectum: Secondary | ICD-10-CM | POA: Diagnosis not present

## 2015-09-15 LAB — COLOGUARD: COLOGUARD: NEGATIVE

## 2015-09-22 ENCOUNTER — Telehealth: Payer: Self-pay | Admitting: Family Medicine

## 2015-09-22 NOTE — Telephone Encounter (Signed)
Pt states that she's had a rapid heart beat over the last several days. It has been keeping her from doing activites.It is worse in the morning and gets better in the evening, night. Pt noticed that this is a side effect of the shingles vaccine. Does she need to do anything about this or be seen?

## 2015-09-22 NOTE — Telephone Encounter (Signed)
I would like for her to try and check her pulse (feel at wrist, neck, or heart and count how many beats in a minute, or count for 15 seconds and multiple x 4). It is helpful to know what "rapid" means--it is <100, vs >150.  This will help me decide how urgently she needs to be seen. I would like for her to be sure to drink plenty of water, and avoid/limit caffeine, and not take decongestant/sinus medications.  She should periodically check her pulse, write it down, and bring this info to a visit if her irregular/fast heartbeat doesn't resolve on its own.  If pulse >110, (and persists, despite make any changes suggested above, if she is drinking caffeine/taking sudafed), then she should be seen

## 2015-09-22 NOTE — Telephone Encounter (Signed)
Pt daughter showed up to her home B/P 156/112 pulse 75

## 2015-09-22 NOTE — Telephone Encounter (Signed)
Pt doesn't think she could take her own pulse and it be accurette her daughter is coming to her house. She states she has been like this since she got the shingles shot and she read that this could be a side affect. She also states her house is being painted all the windows are open maybe that could be causing some of her issue. she states her chest just feels heavy

## 2015-09-22 NOTE — Telephone Encounter (Signed)
Having chest pressure and a very high blood pressure warrants urgent evaluation.  I recommend urgent care given the time that I'm seeing the message.  If she wasn't having chest pressure, office visit tomorrow would have been fine.

## 2015-09-23 NOTE — Telephone Encounter (Signed)
Spoke with patient and she is feeling better-she has appts today and tomorrow. She will call back if she needs to come in .

## 2015-10-05 ENCOUNTER — Ambulatory Visit (INDEPENDENT_AMBULATORY_CARE_PROVIDER_SITE_OTHER): Payer: Medicare Other | Admitting: Family Medicine

## 2015-10-05 ENCOUNTER — Encounter: Payer: Self-pay | Admitting: Family Medicine

## 2015-10-05 VITALS — BP 160/84 | HR 72 | Ht 61.5 in | Wt 117.0 lb

## 2015-10-05 DIAGNOSIS — R5383 Other fatigue: Secondary | ICD-10-CM | POA: Diagnosis not present

## 2015-10-05 DIAGNOSIS — R079 Chest pain, unspecified: Secondary | ICD-10-CM | POA: Diagnosis not present

## 2015-10-05 DIAGNOSIS — IMO0001 Reserved for inherently not codable concepts without codable children: Secondary | ICD-10-CM

## 2015-10-05 DIAGNOSIS — R42 Dizziness and giddiness: Secondary | ICD-10-CM

## 2015-10-05 DIAGNOSIS — R030 Elevated blood-pressure reading, without diagnosis of hypertension: Secondary | ICD-10-CM | POA: Diagnosis not present

## 2015-10-05 LAB — CBC WITH DIFFERENTIAL/PLATELET
BASOS ABS: 0 {cells}/uL (ref 0–200)
Basophils Relative: 0 %
EOS ABS: 0 {cells}/uL — AB (ref 15–500)
Eosinophils Relative: 0 %
HEMATOCRIT: 40 % (ref 35.0–45.0)
Hemoglobin: 13.2 g/dL (ref 11.7–15.5)
LYMPHS PCT: 18 %
Lymphs Abs: 1368 cells/uL (ref 850–3900)
MCH: 28.1 pg (ref 27.0–33.0)
MCHC: 33 g/dL (ref 32.0–36.0)
MCV: 85.1 fL (ref 80.0–100.0)
MONOS PCT: 5 %
MPV: 9.8 fL (ref 7.5–12.5)
Monocytes Absolute: 380 cells/uL (ref 200–950)
NEUTROS PCT: 77 %
Neutro Abs: 5852 cells/uL (ref 1500–7800)
Platelets: 223 10*3/uL (ref 140–400)
RBC: 4.7 MIL/uL (ref 3.80–5.10)
RDW: 15.4 % — AB (ref 11.0–15.0)
WBC: 7.6 10*3/uL (ref 4.0–10.5)

## 2015-10-05 NOTE — Patient Instructions (Signed)
  Your blood pressure monitor was NOT accurate.  It read 151/99 when I got 162/74. Return this to the pharmacy.  The arm monitors are better than the wrist. Omron is a good brand. Bring in any new machine for a nurse visit so that we can let you know if the new monitor is accurate.  When checking your blood pressure, only check it once.  If it is elevated >140/90, then wait a few minutes and repeat. There is no reason to check it more than twice at a given time. When keeping a record of your blood pressures, keep a piece of paper with columns--columns for the date, morning, evening (and put the appoprirate value in the proper column), and also have a comments section, where you can write if you felt bad, had salty food, had a headache, dizzy, had been just running around, after exercise, etc.

## 2015-10-05 NOTE — Progress Notes (Signed)
Chief Complaint  Patient presents with  . Hypertension    4 week follow up on blood pressure. Also complains of difficulty breathing, fast heartbeat and generalized weakness since getting shingles vaccine-cannot even go to church she feels so weak.    "I haven't felt well since I took the shingles shot". Hasn't been able to go to church. She doesn't have any energy. Last week was bad--heart beat was "heavy", but not fast.  Denies chest pain.  She felt like her breathing was heavy. Denies dizziness (maybe had slight dizziness at church last week), chest pain.  Unsure if she was short of breath, just tired. She doesn't feel as bad as last week, just feels "weak". She brings in list of side effects of shingles vaccine, and relates it to the vaccine. She has ongoing symptoms.  Her BP was noted to be elevated at recent visit, and she was advised to follow low sodium diet and to monitor BP regularly.  She bought a wrist monitor, and brought it to be checked today.  She brings in a list of BP's all taken on 6/11--checked it 5x in the morning, ranging from 122/73 up to 153/84.  She checked it 3 times in the afternoon, ranged from 133/70-146/77.  Pulse has been 69-82.  She reports the antihistamines didn't help with sinus pressure. Flonase has been helping, and her sinus pressure is improved. Denies fevers, cough, purulent drainage.  PMH, [place] [facility] reviewed   Outpatient Encounter Prescriptions as of 10/05/2015  Medication Sig  . acetaminophen (TYLENOL) 325 MG tablet Take 325 mg by mouth every 6 (six) hours as needed. Reported on 09/09/2015  . cholecalciferol (VITAMIN D) 1000 UNITS tablet Take 1,000 Units by mouth daily.  . diphenhydramine-acetaminophen (TYLENOL PM) 25-500 MG TABS Take 1 tablet by mouth at bedtime as needed. Takes most nights  . levETIRAcetam (KEPPRA) 500 MG tablet TAKE 1 TABLET BY MOUTH TWICE DAILY *NDC [phone]*  . hydrocortisone valerate cream (WESTCORT) 0.2 % APPLY TO AFFECTED AREA  TWICE A DAY FOR ITCHY RASH (Patient not taking: Reported on 06/22/2015)   No facility-administered encounter medications on file as of 10/05/2015.   Allergies  Allergen Reactions  . Sulfa Antibiotics Other (See Comments)    unknown   ROS: no fever, chills, URI symptoms, some sinus pressure that improved since using Flonase.  She denies sore throat.  +generalized weakness, and feeling like her heart beats hard (not fast). No edema, bleeding, bruising, syncope or other concerns. No vomiting, diarrhea.  PHYSICAL EXAM: BP 160/84 mmHg  Pulse 72  Ht 5' 1.5" (1.562 m)  Wt 117 lb (53.071 kg)  BMI 21.75 kg/m2 Elderly female, mildly anxious in no acute distress. BP 162/74 on repeat by MD, right arm Pt's wrist monitor 151/99 right wrist, shortly afterwards HEENT: PERRL, EOMI, conjunctiva and sclera are clear.  OP is clear Neck: no lymphadenopathy, thyromegaly, carotid bruit or mass Heart: regular rate and rhythm, no murmur Lungs: clear bilaterally Abdomen: soft, nontender, no mass Extremities: no edema Psych: appears anxious/worried.  Normal eye contact, speech, hygiene and grooming Neuro: alert and oriented, cranial nerves intact, normal strength, gait  ASSESSMENT/PLAN:  Chest pain, unspecified chest pain type - complaining of palpitations and heaviness, with elevated BP. Refer to cardiology for eval (likely including echo, poss stress test) - Plan: Basic metabolic panel, EKG 24-MWNU, DG Chest 2 View, Ambulatory referral to Cardiology  Other fatigue - Plan: Basic metabolic panel, CBC with Differential/Platelet, EKG 12-Lead, DG Chest 2 View, Ambulatory referral to  Cardiology  Dizziness and giddiness - Plan: Basic metabolic panel, CBC with Differential/Platelet, EKG 12-Lead, Ambulatory referral to Cardiology  Elevated blood pressure - home wrist monitor is not accurate. Likely has anxiety contributing some. echo recommended - Plan: Ambulatory referral to Cardiology    b-met, CBC today EKG  (compared to 12/2014)--normal, unchanged  Refer to cardiologist--needs eval and echo, possibly stress test if persistent symptoms   Your blood pressure monitor was NOT accurate.  It read 151/99 when I got 162/74. Return this to the pharmacy.  The arm monitors are better than the wrist. Omron is a good brand. Bring in any new machine for a nurse visit so that we can let you know if the new monitor is accurate.  When checking your blood pressure, only check it once.  If it is elevated >140/90, then wait a few minutes and repeat. There is no reason to check it more than twice at a given time. When keeping a record of your blood pressures, keep a piece of paper with columns--columns for the date, morning, evening (and put the appoprirate value in the proper column), and also have a comments section, where you can write if you felt bad, had salty food, had a headache, dizzy, had been just running around, after exercise, etc.

## 2015-10-06 ENCOUNTER — Ambulatory Visit
Admission: RE | Admit: 2015-10-06 | Discharge: 2015-10-06 | Disposition: A | Discharge status: Home / Self Care | Payer: Medicare Other | Source: Ambulatory Visit | Attending: Family Medicine | Admitting: Family Medicine

## 2015-10-06 DIAGNOSIS — R079 Chest pain, unspecified: Secondary | ICD-10-CM

## 2015-10-06 DIAGNOSIS — H5211 Myopia, right eye: Secondary | ICD-10-CM | POA: Diagnosis not present

## 2015-10-06 DIAGNOSIS — R002 Palpitations: Secondary | ICD-10-CM | POA: Diagnosis not present

## 2015-10-06 DIAGNOSIS — H3530 Unspecified macular degeneration: Secondary | ICD-10-CM | POA: Diagnosis not present

## 2015-10-06 DIAGNOSIS — H5202 Hypermetropia, left eye: Secondary | ICD-10-CM | POA: Diagnosis not present

## 2015-10-06 DIAGNOSIS — H52223 Regular astigmatism, bilateral: Secondary | ICD-10-CM | POA: Diagnosis not present

## 2015-10-06 DIAGNOSIS — R5383 Other fatigue: Secondary | ICD-10-CM

## 2015-10-06 LAB — BASIC METABOLIC PANEL
BUN: 10 mg/dL (ref 7–25)
CHLORIDE: 92 mmol/L — AB (ref 98–110)
CO2: 27 mmol/L (ref 20–31)
CREATININE: 0.66 mg/dL (ref 0.60–0.88)
Calcium: 9.5 mg/dL (ref 8.6–10.4)
GLUCOSE: 96 mg/dL (ref 65–99)
Potassium: 4.2 mmol/L (ref 3.5–5.3)
Sodium: 130 mmol/L — ABNORMAL LOW (ref 135–146)

## 2015-10-06 NOTE — Progress Notes (Signed)
Done and mailed her and appt card

## 2015-10-14 ENCOUNTER — Ambulatory Visit: Payer: Medicare Other | Admitting: Family Medicine

## 2015-10-29 ENCOUNTER — Encounter: Payer: Self-pay | Admitting: *Deleted

## 2015-11-05 NOTE — Progress Notes (Signed)
Cardiology Office Note    Date:  11/10/2015   ID:  Alicia LericheShirley A [name], DOB 03/14/1934, MRN [id number]  PCP:  Lavonda JumboKNAPP,EVE A, MD  Cardiologist:  Alicia MillersBrian Asaph Serena, MD     History of Present Illness:  Alicia [name] is a 82 y.o. female for evaluation of hypertension. Seen recently with these complaints. Chest x-ray negative. Hemoglobin 13.2. Sodium 130 and potassium 4.2. TSH May 2017 normal. Patient has mild dyspnea with more moderate activities but not routine activities. No orthopnea, PND, pedal edema, chest pain, palpitations or syncope. She has noticed increasing blood pressure over the past 3 months. Her systolic is in the 150s to 160 range. Cardiology asked to evaluate.    Past Medical History  Diagnosis Date  . Osteoporosis     DEXA 03/2010 T-3.2 R hip; declines meds  . Vitamin D deficiency   . GERD (gastroesophageal reflux disease)   . Tinnitus 05/2009    DR BATES--related to change in med/generic.  Resolved  . Seizure disorder (HCC) 1997    evaluated by [facility] in past  . PUD (peptic ulcer disease) 1980  . Hyperlipidemia   . Restless leg syndrome   . Hx of migraines   . Macular degeneration   . Eye exam abnormal 11/21/14    Dr.Hecker-glaucoma suspect, cataracts, hemorrage    Past Surgical History  Procedure Laterality Date  . Tonsillectomy    . Tubal ligation    . Cataract extraction Bilateral 11/2014, 01/2015    Dr. Elmer PickerHecker    Current Medications: Outpatient Prescriptions Prior to Visit  Medication Sig Dispense Refill  . acetaminophen (TYLENOL) 325 MG tablet Take 325 mg by mouth every 6 (six) hours as needed. Reported on 09/09/2015    . cholecalciferol (VITAMIN D) 1000 UNITS tablet Take 1,000 Units by mouth daily.    . diphenhydramine-acetaminophen (TYLENOL PM) 25-500 MG TABS Take 1 tablet by mouth at bedtime as needed. Takes most nights    . fluticasone (FLONASE) 50 MCG/ACT nasal spray Place 2 sprays into both nostrils daily.    . hydrocortisone valerate cream (WESTCORT)  0.2 % APPLY TO AFFECTED AREA TWICE A DAY FOR ITCHY RASH 45 g 0  . levETIRAcetam (KEPPRA) 500 MG tablet TAKE 1 TABLET BY MOUTH TWICE DAILY *NDC [phone]* 180 tablet 3   No facility-administered medications prior to visit.     Allergies:   Sulfa antibiotics   Social History   Social History  . Marital Status: Widowed    Spouse Name: N/A  . Number of Children: 2  . Years of Education: N/A   Occupational History  . retired (from [employer])    Social History Main Topics  . Smoking status: Never Smoker   . Smokeless tobacco: Never Used  . Alcohol Use: No  . Drug Use: No  . Sexual Activity: Not Currently   Other Topics Concern  . None   Social History Narrative   Lives alone (in a one-level townhome).  Widowed 2009.  1 daughter in [place], 1 daughter in [place]. 5 grandchildren     Family History:  The patient's family history includes Alzheimer's disease in her sister; Dementia in her father; Heart disease in her mother; Thyroid disease in her sister. There is no history of Diabetes or Cancer.   ROS:   Please see the history of present illness.    No weight loss, productive cough, hemoptysis, dysphagia, odynophagia, melena, hematochezia, dysuria, hematuria, rash, seizure activity, orthopnea, PND, pedal edema, claudication. All remaining systems negative.  PHYSICAL EXAM:   VS:  BP 162/74 mmHg  Pulse 81  Ht  (1.575 m)  Wt 117 lb (53.071 kg)  BMI 21.39 kg/m2   GEN: Well nourished, well developed, in no acute distress HEENT: normal Neck: no JVD, carotid bruits, or masses Cardiac: RRR; no rubs, or gallops,no edema, 2/6 systolic murmur Respiratory:  clear to auscultation bilaterally, normal work of breathing GI: soft, nontender, nondistended, + BS MS: no deformity or atrophy Skin: warm and dry, no rash Neuro:  Alert and Oriented x 3, Strength and sensation are intact Psych: euthymic mood, full affect  Wt Readings from Last 3 Encounters:  11/10/15 117 lb (53.071  kg)  10/05/15 117 lb (53.071 kg)  09/09/15 118 lb 3.2 oz (53.615 kg)      Studies/Labs Reviewed:   EKG:  EKG - 10/05/2015-sinus rhythm with nonspecific T-wave changes.  Recent Labs: 09/09/2015: ALT 11; TSH 1.21 10/05/2015: BUN 10; Creat 0.66; Hemoglobin 13.2; Platelets 223; Potassium 4.2; Sodium 130*   Lipid Panel    Component Value Date/Time   CHOL 203* 08/13/2014 0001   TRIG 87 08/13/2014 0001   HDL 100 08/13/2014 0001   CHOLHDL 2.0 08/13/2014 0001   VLDL 17 08/13/2014 0001   LDLCALC 86 08/13/2014 0001     A/P  1 Hypertension-patient has been following her blood pressure at home and systolic is approximately 160. We discussed lifestyle modification. I will begin lisinopril 5 mg daily. Check potassium and renal function in 1 week. Increase dose as needed. Goal will be systolic of approximately 140 and diastolic of 85.  2 Murmur-systolic murmur on examination. Possible mild aortic stenosis. Check baseline echocardiogram.  3 DOE-mild; check echo to assess LV function.     Medication Adjustments/Labs and Tests Ordered: Current medicines are reviewed at length with the patient today.  Concerns regarding medicines are outlined above.  Medication changes, Labs and Tests ordered today are listed in the Patient Instructions below. There are no Patient Instructions on file for this visit.   Signed, Alicia Millers, MD  11/10/2015 9:56 AM     [facility]

## 2015-11-09 ENCOUNTER — Ambulatory Visit: Payer: Medicare Other | Admitting: Family Medicine

## 2015-11-10 ENCOUNTER — Encounter: Payer: Self-pay | Admitting: Cardiology

## 2015-11-10 ENCOUNTER — Ambulatory Visit (INDEPENDENT_AMBULATORY_CARE_PROVIDER_SITE_OTHER): Payer: Medicare Other | Admitting: Cardiology

## 2015-11-10 VITALS — BP 162/74 | HR 81 | Ht 62.0 in | Wt 117.0 lb

## 2015-11-10 DIAGNOSIS — R030 Elevated blood-pressure reading, without diagnosis of hypertension: Secondary | ICD-10-CM

## 2015-11-10 DIAGNOSIS — R011 Cardiac murmur, unspecified: Secondary | ICD-10-CM | POA: Diagnosis not present

## 2015-11-10 DIAGNOSIS — IMO0001 Reserved for inherently not codable concepts without codable children: Secondary | ICD-10-CM

## 2015-11-10 DIAGNOSIS — R0600 Dyspnea, unspecified: Secondary | ICD-10-CM

## 2015-11-10 MED ORDER — LISINOPRIL 5 MG PO TABS: 5.0000 mg | ORAL_TABLET | Freq: Every day | ORAL | Status: DC

## 2015-11-10 NOTE — Patient Instructions (Signed)
Medications  START Lisinopril 5 mg daily.    Lab work  Return for lab work in 1 week    Procedures  Your physician has requested that you have an echocardiogram at [address]. Echocardiography is a painless test that uses sound waves to create images of your heart. It provides your doctor with information about the size and shape of your heart and how well your heart's chambers and valves are working. This procedure takes approximately one hour. There are no restrictions for this procedure.   Follow-up in 6 months with Dr. Jens Somrenshaw.

## 2015-11-11 ENCOUNTER — Telehealth: Payer: Self-pay | Admitting: Cardiology

## 2015-11-11 MED ORDER — LISINOPRIL 2.5 MG PO TABS: 2.5000 mg | ORAL_TABLET | Freq: Every day | ORAL | Status: DC

## 2015-11-11 NOTE — Telephone Encounter (Signed)
Spoke with pt, Aware of dr crenshaw's recommendations.  °

## 2015-11-11 NOTE — Telephone Encounter (Signed)
Patient called in regarding her BP - was just started on lisinopril 5mg  QD yesterday She took her BP prior to going to an exercise class @ [facility] [employer] - systolic was under 100 After exercise, systolic was 113 [facility] and her systolic was 83 Patient is tired now, but has been at the gym Patient states she feels fine  Patient took first dose yesterday afternoon and then again this AM  Will defer to MD to advise

## 2015-11-11 NOTE — Telephone Encounter (Signed)
New message      Talk to a nurse regarding her bp.  Pt was seen yesterday

## 2015-11-11 NOTE — Telephone Encounter (Signed)
Hold lisinopril today and tomorrow and then resume at 2.5 mg daily; follow BP and we will adjust as needed. Olga MillersBrian Adream Parzych

## 2015-11-19 DIAGNOSIS — R030 Elevated blood-pressure reading, without diagnosis of hypertension: Secondary | ICD-10-CM | POA: Diagnosis not present

## 2015-11-19 LAB — BASIC METABOLIC PANEL WITH GFR
BUN: 13 mg/dL (ref 7–25)
CHLORIDE: 100 mmol/L (ref 98–110)
CO2: 28 mmol/L (ref 20–31)
CREATININE: 0.88 mg/dL (ref 0.60–0.88)
Calcium: 9.2 mg/dL (ref 8.6–10.4)
GFR, Est African American: 72 mL/min (ref >=60)
GFR, Est Non African American: 62 mL/min (ref >=60)
GLUCOSE: 87 mg/dL (ref 65–99)
Potassium: 4.6 mmol/L (ref 3.5–5.3)
Sodium: 136 mmol/L (ref 135–146)

## 2015-11-24 ENCOUNTER — Other Ambulatory Visit (HOSPITAL_COMMUNITY): Payer: Self-pay

## 2015-11-24 ENCOUNTER — Ambulatory Visit (HOSPITAL_COMMUNITY): Payer: Medicare Other | Attending: Cardiovascular Disease

## 2015-11-24 DIAGNOSIS — R011 Cardiac murmur, unspecified: Secondary | ICD-10-CM | POA: Insufficient documentation

## 2015-11-24 DIAGNOSIS — I071 Rheumatic tricuspid insufficiency: Secondary | ICD-10-CM | POA: Insufficient documentation

## 2015-11-24 DIAGNOSIS — I340 Nonrheumatic mitral (valve) insufficiency: Secondary | ICD-10-CM | POA: Diagnosis not present

## 2015-11-24 DIAGNOSIS — I119 Hypertensive heart disease without heart failure: Secondary | ICD-10-CM | POA: Diagnosis not present

## 2015-11-24 NOTE — Progress Notes (Signed)
Chief Complaint  Patient presents with  . Follow-up    2 month follow up.    Patient presents for 2 month follow-up, accompanied by her daughter, Ernest Pine.  She was last seen 2 months ago, with complaints of elevated blood pressures, fatigue, "heavy heartbeat" and some shortness of breath with exertion, which she associated with getting the shingles vaccine.  She was referred to cardiology, started on low dose ACEI and had echocardiogram yesterday. Dose of lisinopril was lowered from '5mg'$  to 2.'5mg'$  after she had very low BP's noted (systolic <161). She had only taken it 2 days before the dose change.  Systolic BP was 93 before exercise, 113 after [facility], [facility]  She was a little tired, but not dizzy, weak.  BP's have been 127-140's/85 at home since then.  She had f/u b-met done for Dr. Stanford Breed (last week) since being on ACEI:   Chemistry      Component Value Date/Time   NA 136 11/19/2015 0905   K 4.6 11/19/2015 0905   CL 100 11/19/2015 0905   CO2 28 11/19/2015 0905   BUN 13 11/19/2015 0905   CREATININE 0.88 11/19/2015 0905      Component Value Date/Time   CALCIUM 9.2 11/19/2015 0905   ALKPHOS 29 (L) 09/09/2015 0905   AST 19 09/09/2015 0905   ALT 11 09/09/2015 0905   BILITOT 0.4 09/09/2015 0905      She is back at the gym, back to going to church, and all of the symptoms she had at her visit in June resolved.  Echo done yesterday showed: Study Conclusions  - Left ventricle: The cavity size was normal. There was mild focal   basal hypertrophy of the septum. Systolic function was normal.   The estimated ejection fraction was in the range of 60% to 65%.   Wall motion was normal; there were no regional wall motion   abnormalities. Doppler parameters are consistent with abnormal   left ventricular relaxation (grade 1 diastolic dysfunction).   Doppler parameters are consistent with indeterminate ventricular   filling pressure. - Aortic valve: Transvalvular  velocity was within the normal range.   There was no stenosis. There was no regurgitation. - Mitral valve: Transvalvular velocity was within the normal range.   There was no evidence for stenosis. There was trivial   regurgitation. - Right ventricle: The cavity size was normal. Wall thickness was   normal. Systolic function was normal. - Atrial septum: No defect or patent foramen ovale was identified   by color flow Doppler. - Tricuspid valve: There was trivial regurgitation. - Pulmonary arteries: Systolic pressure was within the normal   range. PA peak pressure: 23 mm Hg (S).   PMH, PSH, SH reviewed  Outpatient Encounter Prescriptions as of 11/25/2015  Medication Sig Note  . acetaminophen (TYLENOL) 325 MG tablet Take 325 mg by mouth every 6 (six) hours as needed. Reported on 09/09/2015   . Chlorphen-Phenyleph-ASA (ALKA-SELTZER PLUS COLD PO) Take 1 tablet by mouth daily. 11/25/2015: Most days  . cholecalciferol (VITAMIN D) 1000 UNITS tablet Take 1,000 Units by mouth daily.   . diphenhydramine-acetaminophen (TYLENOL PM) 25-500 MG TABS Take 1 tablet by mouth at bedtime as needed. Takes most nights   . levETIRAcetam (KEPPRA) 500 MG tablet TAKE 1 TABLET BY MOUTH TWICE DAILY *NDC [phone]*   . lisinopril (PRINIVIL,ZESTRIL) 2.5 MG tablet Take 1 tablet (2.5 mg total) by mouth daily.   . hydrocortisone valerate cream (WESTCORT) 0.2 %  APPLY TO AFFECTED AREA TWICE A DAY FOR ITCHY RASH (Patient not taking: Reported on 11/25/2015)   . [DISCONTINUED] fluticasone (FLONASE) 50 MCG/ACT nasal spray Place 2 sprays into both nostrils daily.    No facility-administered encounter medications on file as of 11/25/2015.    Allergies  Allergen Reactions  . Sulfa Antibiotics Other (See Comments)    unknown   ROS: no fever, chills, headache, dizziness, chest pain, palpitations, energy is back to normal. No nausea, vomiting, bowel changes, cough, shortness of breath, edema, bleeding, bruising, rash or other  complaints.   PHYSICAL EXAM: BP (!) 144/88   Pulse 72   Ht '5\' 2"'$  (1.575 m)   Wt 117 lb 6.4 oz (53.3 kg)   BMI 21.47 kg/m  154/80 on repeat by MD  Well developed, pleasant, slightly anxious, talkative female in no distress HEENT: PERRL, EOMI, conjunctiva and sclera are clear, OP clear Neck: no lymphadenopathy, thyromegaly or carotid bruit Heart: regular rate and rhythm.  Possible 2/6 SEM noted at apex Lungs: clear bilaterally Extremities: no edema Neuro: alert and oriented, cranial nerves intact, normal strength, gait Psych: slightly anxious/excitable, as per her usual.  Normal mood, affect, hygiene, grooming, eye contact, speech Skin: normal turgor, no bruising  ASSESSMENT/PLAN:  Essential hypertension - likely has some white coat component. BP's borderline on 2.'5mg'$ , may need dose increased back to '5mg'$ . Send BP's in 2 wks for review  Seizure disorder [facility]) - continue current meds--reassured that dose isn't "high", discussed mg's, answered questions  Diastolic dysfunction - grade 1 per echo 11/2015. Continue ACEI  Reviewed all results, that given the diastolic dysfunction, I agree with recommendation to stay on ACEI.  Very surprising that BP's were so low on 2.'5mg'$ , and borderline high on '5mg'$ . Reassured regarding medications, blood pressure, symptoms, answered her many questions, as well as her daughter's.   Please keep a log of your blood pressures. Have columns for the date, blood pressure, pulse, and a comments section where you can write information such as: If you felt bad (headache, dizzy) If it was after exercise (or before) If you were sick, if you forgot your medication, if you had any other contributing factors, such as recent high sodium meal (chinese or ham for dinner the night before)--things to explain any outlying values, high or low.  Bring this list with you to all visits to Dr. Tomi Bamberger, and also to Dr. Stanford Breed  Mail [place] (or fax) a copy of this list within the  next 2-4 weeks so that we can address whether 2.'5mg'$  is the appropriate dose, or if it needs to be increased back up to '5mg'$ .  I need to see these values to make this decision, since your blood pressure always runs a little higher in my office.  F/u as scheduled in May, since she has an appt with cardiologist for 6 months

## 2015-11-25 ENCOUNTER — Encounter: Payer: Self-pay | Admitting: Family Medicine

## 2015-11-25 ENCOUNTER — Ambulatory Visit (INDEPENDENT_AMBULATORY_CARE_PROVIDER_SITE_OTHER): Payer: Medicare Other | Admitting: Family Medicine

## 2015-11-25 VITALS — BP 144/88 | HR 72 | Ht 62.0 in | Wt 117.4 lb

## 2015-11-25 DIAGNOSIS — G40909 Epilepsy, unspecified, not intractable, without status epilepticus: Secondary | ICD-10-CM

## 2015-11-25 DIAGNOSIS — I5189 Other ill-defined heart diseases: Secondary | ICD-10-CM

## 2015-11-25 DIAGNOSIS — I519 Heart disease, unspecified: Secondary | ICD-10-CM

## 2015-11-25 DIAGNOSIS — I10 Essential (primary) hypertension: Secondary | ICD-10-CM | POA: Diagnosis not present

## 2015-11-25 NOTE — Patient Instructions (Signed)
  Please keep a log of your blood pressures. Have columns for the date, blood pressure, pulse, and a comments section where you can write information such as: If you felt bad (headache, dizzy) If it was after exercise (or before) If you were sick, if you forgot your medication, if you had any other contributing factors, such as recent high sodium meal (chinese or ham for dinner the night before)--things to explain any outlying values, high or low.  Bring this list with you to all visits to Dr. Lynelle Doctor, and also to Dr. Jens Som  Mail [place] (or fax) a copy of this list within the next 2-4 weeks so that we can address whether 2.5mg  is the appropriate dose, or if it needs to be increased back up to 5mg .  I need to see these values to make this decision, since your blood pressure always runs a little higher in my office.

## 2015-12-11 ENCOUNTER — Telehealth: Payer: Self-pay | Admitting: Cardiology

## 2015-12-11 MED ORDER — LOSARTAN POTASSIUM 25 MG PO TABS: 25.0000 mg | ORAL_TABLET | Freq: Every day | ORAL | 12 refills | Status: DC

## 2015-12-11 NOTE — Telephone Encounter (Signed)
Mrs. Alicia [name] is calling because she is taken Lisinopril 5mg  and is having a a coughing spell . She is wanting to know is there something else she can take . Please call    Thanks

## 2015-12-11 NOTE — Telephone Encounter (Signed)
Dc lisinopril, cozaar 25 mg daily Olga MillersBrian Crenshaw

## 2015-12-11 NOTE — Telephone Encounter (Signed)
Left msg to call.

## 2015-12-11 NOTE — Telephone Encounter (Signed)
Spoke to patient, who notes she is taking the half tablet of lisinopril (2.5mg  daily) and has been having issues w/ dry cough for several weeks. Aware the ACE inhibitors may cause cough. She notes no cold/URI symptoms, no other acute concerns.  Informed patient I would defer to Dr. Jens Somrenshaw for recommendation on ARB or alternative med. Pt voiced thanks and understanding. Informs me [facility] and notify her.

## 2015-12-11 NOTE — Telephone Encounter (Signed)
Spoke with pt, Aware of dr crenshaw's recommendations.  °

## 2015-12-30 ENCOUNTER — Telehealth: Payer: Self-pay | Admitting: Cardiology

## 2015-12-30 DIAGNOSIS — Z23 Encounter for immunization: Secondary | ICD-10-CM | POA: Diagnosis not present

## 2015-12-30 NOTE — Telephone Encounter (Signed)
New message    [facility] patient verbalized she  coming off the losartan  - Not feeling well.    Went back on lisinopril

## 2015-12-30 NOTE — Telephone Encounter (Signed)
Attempt to call patient to f/u-no answer, lmtcb.

## 2016-03-25 DIAGNOSIS — M7711 Lateral epicondylitis, right elbow: Secondary | ICD-10-CM | POA: Diagnosis not present

## 2016-05-06 ENCOUNTER — Other Ambulatory Visit: Payer: Self-pay | Admitting: Family Medicine

## 2016-05-06 DIAGNOSIS — Z961 Presence of intraocular lens: Secondary | ICD-10-CM | POA: Diagnosis not present

## 2016-05-06 DIAGNOSIS — H3530 Unspecified macular degeneration: Secondary | ICD-10-CM | POA: Diagnosis not present

## 2016-05-06 DIAGNOSIS — R21 Rash and other nonspecific skin eruption: Secondary | ICD-10-CM

## 2016-05-06 NOTE — Telephone Encounter (Signed)
Is this okay to refill? 

## 2016-07-08 ENCOUNTER — Other Ambulatory Visit: Payer: Self-pay | Admitting: Cardiology

## 2016-07-08 ENCOUNTER — Other Ambulatory Visit: Payer: Self-pay | Admitting: Family Medicine

## 2016-07-08 NOTE — Telephone Encounter (Signed)
Looks like pt wants to go back on med is it okay to refill

## 2016-07-08 NOTE — Telephone Encounter (Signed)
This was stopped due to cough back in August, and changed by her cardiologist to losartan.  She hasn't seen either of us since July/August, and doesn't have any f/u scheduled (has appt with me in May--too far out if I am the one addressing this medication). Not sure what she has been taking in the interim (I see a message to the cardiologist in September that she was switching back to lisinopril.  The refill request encounter looks strange--has my name, but states Cardiology, [facility]. This may have been intended to be directed to Dr. Jens Somrenshaw? I think it should be  (or she needs OV with me now, not waiting until May)

## 2016-07-11 ENCOUNTER — Other Ambulatory Visit: Payer: Self-pay | Admitting: Cardiology

## 2016-07-11 ENCOUNTER — Telehealth: Payer: Self-pay | Admitting: Cardiology

## 2016-07-11 NOTE — Telephone Encounter (Signed)
.   Closed encounter. Message not needed

## 2016-07-22 ENCOUNTER — Ambulatory Visit (INDEPENDENT_AMBULATORY_CARE_PROVIDER_SITE_OTHER): Payer: Medicare Other | Admitting: Physician Assistant

## 2016-07-22 ENCOUNTER — Encounter: Payer: Self-pay | Admitting: Physician Assistant

## 2016-07-22 VITALS — BP 138/79 | HR 76 | Ht 62.0 in | Wt 116.6 lb

## 2016-07-22 DIAGNOSIS — R252 Cramp and spasm: Secondary | ICD-10-CM | POA: Diagnosis not present

## 2016-07-22 DIAGNOSIS — I10 Essential (primary) hypertension: Secondary | ICD-10-CM

## 2016-07-22 DIAGNOSIS — E785 Hyperlipidemia, unspecified: Secondary | ICD-10-CM | POA: Diagnosis not present

## 2016-07-22 DIAGNOSIS — G2581 Restless legs syndrome: Secondary | ICD-10-CM

## 2016-07-22 NOTE — Patient Instructions (Signed)
Medication Instructions: Your physician recommends that you continue on your current medications as directed. Please refer to the Current Medication list given to you today.  Labwork: Alicia [name] recommends that you obtain a Fasting Lipid Panel when you see your PCP.   Follow-Up: Your physician wants you to follow-up in: 6 months with Dr. Jens Som. You will receive a reminder letter in the mail two months in advance. If you don't receive a letter, please call our office to schedule the follow-up appointment.  If you need a refill on your cardiac medications before your next appointment, please call your pharmacy.

## 2016-07-22 NOTE — Progress Notes (Signed)
Cardiology Office Note    Date:  07/22/2016   ID:  Alicia [name], DOB 04-03-1934, MRN [id number]  PCP:  Lavonda Jumbo, MD  Cardiologist:  Dr. Jens Som  Chief Complaint  Patient presents with  . Follow-up    seen for Dr. Jens Som, pt c/o dizziness    History of Present Illness:  Alicia [name] is a 83 y.o. female with PMH of HTN, HLD, RLS and h/o seizure. During the last visit on 11/10/2015, lisinopril 5 mg was initiated due to blood pressure in the 160s. According to Dr. Jens Som, her goal should be systolic approximately 140 and diastolic should be around 85. Repeat labs shows her potassium level and a creatinine was stable after initiating lisinopril. She did complain of some shortness of breath with moderate exertion however no chest pain. Echocardiogram obtained on 11/24/2015 showed normal EF 60-65%, mild focal basal hypertrophy of the septum, no obvious significant valvular issues. Six-month follow-up was recommended. Since then, her lisinopril was switched to Cozaar due to dry cough. Apparently she did not feel better while on losartan and later switched back to his lisinopril.  Currently, she is on 5 mg lisinopril, her blood pressure is consistently in the 130s. I am fine with current blood pressure. She continued to go to the gym and walk on the treadmill twice a week. She is part of the [employer] program. She has not had any cholesterol lab since April 2016, at that time her total cholesterol is only borderline at 203. She is not on statin medication. I did recommend for her to obtain a fasting lipid panel next time she sees her primary care physician in May. She is not fasting today and I do not think this is urgent. She has been having some leg cramps at night, however he does not have any pain in the legs with ambulation. I do not think this represented peripheral vascular disease, this is likely related to RLS or venous insufficiency as her symptom is not associated with  exertion and only occurs at night. Otherwise we'll see her back in 6 months.    Past Medical History:  Diagnosis Date  . Eye exam abnormal 11/21/14   Dr.Hecker-glaucoma suspect, cataracts, hemorrage  . GERD (gastroesophageal reflux disease)   . Hx of migraines   . Hyperlipidemia   . Macular degeneration   . Osteoporosis    DEXA 03/2010 T-3.2 R hip; declines meds  . PUD (peptic ulcer disease) 1980  . Restless leg syndrome   . Seizure disorder ([facility]) 1997   evaluated by [facility] in past  . Tinnitus 05/2009   DR BATES--related to change in med/generic.  Resolved  . Vitamin D deficiency     Past Surgical History:  Procedure Laterality Date  . CATARACT EXTRACTION Bilateral 11/2014, 01/2015   Dr. Elmer Picker  . TONSILLECTOMY    . TUBAL LIGATION      Current Medications: Outpatient Medications Prior to Visit  Medication Sig Dispense Refill  . acetaminophen (TYLENOL) 325 MG tablet Take 325 mg by mouth every 6 (six) hours as needed. Reported on 09/09/2015    . Chlorphen-Phenyleph-ASA (ALKA-SELTZER PLUS COLD PO) Take 1 tablet by mouth daily.    . cholecalciferol (VITAMIN D) 1000 UNITS tablet Take 1,000 Units by mouth daily.    . diphenhydramine-acetaminophen (TYLENOL PM) 25-500 MG TABS Take 1 tablet by mouth at bedtime as needed. Takes most nights    . hydrocortisone valerate cream (WESTCORT) 0.2 % APPLY TO AFFECTED AREA TWICE A  DAY FOR ITCHY RASH 60 g 0  . levETIRAcetam (KEPPRA) 500 MG tablet TAKE 1 TABLET BY MOUTH TWICE DAILY *NDC [phone]* 180 tablet 3  . lisinopril (PRINIVIL,ZESTRIL) 5 MG tablet TAKE 1 TABLET (5 MG TOTAL) BY MOUTH DAILY. 30 tablet 1  . losartan (COZAAR) 25 MG tablet Take 1 tablet (25 mg total) by mouth daily. 30 tablet 12   No facility-administered medications prior to visit.      Allergies:   Sulfa antibiotics   Social History   Social History  . Marital status: Widowed    Spouse name: N/A  . Number of children: 2  . Years of education: N/A   Occupational  History  . retired (from Community education officer)    Social History Main Topics  . Smoking status: Never Smoker  . Smokeless tobacco: Never Used  . Alcohol use No  . Drug use: No  . Sexual activity: Not Currently   Other Topics Concern  . None   Social History Narrative   Lives alone (in a one-level townhome).  Widowed 2009.  1 daughter in [place], 1 daughter in [place]. 5 grandchildren     Family History:  The patient's family history includes Alzheimer's disease in her sister; Dementia in her father; Heart disease in her mother; Thyroid disease in her sister.   ROS:   Please see the history of present illness.    ROS All other systems reviewed and are negative.   PHYSICAL EXAM:   VS:  BP 138/79 (BP Location: Right Arm, Patient Position: Sitting, Cuff Size: Normal)   Pulse 76   Ht  (1.575 m)   Wt 116 lb 9.6 oz (52.9 kg)   BMI 21.33 kg/m    GEN: Well nourished, well developed, in no acute distress  HEENT: normal  Neck: no JVD, carotid bruits, or masses Cardiac: RRR; no murmurs, rubs, or gallops,no edema  Respiratory:  clear to auscultation bilaterally, normal work of breathing GI: soft, nontender, nondistended, + BS MS: no deformity or atrophy  Skin: warm and dry, no rash Neuro:  Alert and Oriented x 3, Strength and sensation are intact Psych: euthymic mood, full affect  Wt Readings from Last 3 Encounters:  07/22/16 116 lb 9.6 oz (52.9 kg)  11/25/15 117 lb 6.4 oz (53.3 kg)  11/10/15 117 lb (53.1 kg)      Studies/Labs Reviewed:   EKG:  EKG is not ordered today.    Recent Labs: 09/09/2015: ALT 11; TSH 1.21 10/05/2015: Hemoglobin 13.2; Platelets 223 11/19/2015: BUN 13; Creat 0.88; Potassium 4.6; Sodium 136   Lipid Panel    Component Value Date/Time   CHOL 203 (H) 08/13/2014 0001   TRIG 87 08/13/2014 0001   HDL 100 08/13/2014 0001   CHOLHDL 2.0 08/13/2014 0001   VLDL 17 08/13/2014 0001   LDLCALC 86 08/13/2014 0001    Additional studies/ records that were reviewed  today include:   Echo 11/24/2015 LV EF: 60% -   65%  - Left ventricle: The cavity size was normal. There was mild focal   basal hypertrophy of the septum. Systolic function was normal.   The estimated ejection fraction was in the range of 60% to 65%.   Wall motion was normal; there were no regional wall motion   abnormalities. Doppler parameters are consistent with abnormal   left ventricular relaxation (grade 1 diastolic dysfunction).   Doppler parameters are consistent with indeterminate ventricular   filling pressure. - Aortic valve: Transvalvular velocity was within the normal range.  There was no stenosis. There was no regurgitation. - Mitral valve: Transvalvular velocity was within the normal range.   There was no evidence for stenosis. There was trivial   regurgitation. - Right ventricle: The cavity size was normal. Wall thickness was   normal. Systolic function was normal. - Atrial septum: No defect or patent foramen ovale was identified   by color flow Doppler. - Tricuspid valve: There was trivial regurgitation. - Pulmonary arteries: Systolic pressure was within the normal   range. PA peak pressure: 23 mm Hg (S).   ASSESSMENT:    1. Essential hypertension   2. Hyperlipidemia, unspecified hyperlipidemia type   3. Leg cramps   4. RLS (restless legs syndrome)      PLAN:  In order of problems listed above:  1. Hypertension: Blood pressure relatively well-controlled, 138/79 today. I'm hesitant to increase her blood pressure medication any further in fear of creating more dizziness.  2. Hyperlipidemia: She is not on statin, her last cholesterol panel was obtained in 2016, LDL was borderline high at the time. I recommended diet and exercise and repeat a lipid panel the next time she sees her primary care physician.  3. Leg cramps: Her leg cramps does not occur when she ambulated on the treadmill and only occurs at night, this is likely related to restless leg syndrome or  venous insufficiency. I do not think she need any ABI at this time.    Medication Adjustments/Labs and Tests Ordered: Current medicines are reviewed at length with the patient today.  Concerns regarding medicines are outlined above.  Medication changes, Labs and Tests ordered today are listed in the Patient Instructions below. Patient Instructions  Medication Instructions: Your physician recommends that you continue on your current medications as directed. Please refer to the Current Medication list given to you today.  Labwork: Azalee Course recommends that you obtain a Fasting Lipid Panel when you see your PCP.   Follow-Up: Your physician wants you to follow-up in: 6 months with Dr. Jens Som. You will receive a reminder letter in the mail two months in advance. If you don't receive a letter, please call our office to schedule the follow-up appointment.  If you need a refill on your cardiac medications before your next appointment, please call your pharmacy.     Ramond Dial, [place]  07/22/2016 11:39 PM    [facility] [address] [place], [place], [place]  [address] Phone: [phone]; Fax: [phone]

## 2016-08-18 ENCOUNTER — Other Ambulatory Visit: Payer: Self-pay | Admitting: Family Medicine

## 2016-08-18 DIAGNOSIS — G40909 Epilepsy, unspecified, not intractable, without status epilepticus: Secondary | ICD-10-CM

## 2016-09-03 ENCOUNTER — Other Ambulatory Visit: Payer: Self-pay | Admitting: Cardiology

## 2016-09-05 NOTE — Telephone Encounter (Signed)
Rx(s) sent to pharmacy electronically.  

## 2016-09-13 NOTE — Progress Notes (Signed)
Chief Complaint  Patient presents with  . Medicare Wellness    fasting AWV/Med check with pelvic exam (does not want to do pelvic exam). No concerns.    Alicia [name] is a 83 y.o. female who presents for annual wellness visit and follow-up on chronic medical conditions.    Hypertension and diastolic dysfunction (grade 1 per echo 11/2015). Lisinopril was switched to Cozaar due to dry cough, but she reportedly did not feel better while on losartan and later switched back to his lisinopril. Currently, she is on 2.5 mg lisinopril, her blood pressure is consistently in the 130-140's. She last saw cardiologist 07/22/16. They would like a fasting lipid panel done today. At that time she had been on '5mg'$  tablet.  Since that visit, she had an episode while she was driving where she felt dizzy, nauseated.  It happened one other time where she was dizzy (vertigo) and vomiting. She spoke with the pharmacist, and started cutting the lisinopril in 1/2 as she was told that the lisinopril could make her "pass out" and she felt like she might pass out when she was dizzy and sick.  She hasn't had any further episodes.  BP's at home have been in the 140 range since cutting back on the lisinopril dose.  She didn't communicate her problem to cardiologist or here. She feels like the lisinopril affects her energy some, but really doesn't report much improvement since decreasing the dose. Overall is okay, and she wants to remain on the 2.'5mg'$  dose.  She switched taking her medicine to bedtime.  Osteoporosis: She drinks 2-3 glasses of milk daily.  She has declined any treatment for osteoporosis in the past, refusing to take any more medications. She continues to have no interest in discussing treatment of osteoporosis.  Seizure disorder: she hasn't had any seizures since being changed to just the Keppra alone (lamictal stopped). Last seizure was probably in 1998.  Immunization History  Administered Date(s) Administered  .  Influenza Split 03/01/2012  . Influenza, High Dose Seasonal PF 01/19/2014, 01/19/2015, 12/30/2015  . Influenza-Unspecified 12/24/2012  . Pneumococcal Conjugate-13 08/13/2014  . Pneumococcal Polysaccharide-23 04/26/1999, 09/01/2006  . Td 09/01/2006  . Tdap 07/25/2011  . Zoster 09/10/2015   Last Pap smear: 07/2014, normal, with no high risk HPV present Last mammogram: 12/2013 Last colonoscopy: Per Dr. Naaman Plummer first note at [facility], said 2 years prior (so would have been approx 2006); negative Cologard 08/2015 Last DEXA: 03/2010 (shows osteoporosis, but pt refuses treatment) Ophtho: goes yearly  Dentist: once a year, usually, hasn't gone to the dentist since hers retired, years ago, past due Exercise: [facility], does [employer], international aid classes (includes weights).  She does the treadmill occasionally (not in the summer, too hot). No exercise outside of going to the gym. Admits she has been slacking off going to the gym recently, since dizzy spells.  Lab Results  Component Value Date   CHOL 203 (H) 08/13/2014   HDL 100 08/13/2014   LDLCALC 86 08/13/2014   TRIG 87 08/13/2014   CHOLHDL 2.0 08/13/2014   Vitamin D level 07/2011: 59 (on same supplements)  Other doctors caring for patient include: Cardiologist: Dr. Stanford Breed Dentist: Dr. Archie Balboa (retired--hasn't found new one yet) ophtho--Dr. Rebeca Allegra at [facility] for glasses, Dr. Herbert Deaner for cataracts Neuro: at [facility] (last seen 07/2013) Dr. Jeoffrey Massed   Depression screen: negative Fall screen: negative Functional status screen:  Notable for urinary leakage (wears pads constantly, unchanged).  No longer  having ringing in her ear (since she stays on [employer] brand of her medication) See full screen in Epic.   End of Life Discussion:  Patient has a living will and medical power of attorney   Past Medical History:  Diagnosis Date  . Eye exam abnormal 11/21/14   Dr.Hecker-glaucoma suspect, cataracts,  hemorrage  . GERD (gastroesophageal reflux disease)   . Hx of migraines   . Hyperlipidemia   . Macular degeneration   . Osteoporosis    DEXA 03/2010 T-3.2 R hip; declines meds  . PUD (peptic ulcer disease) 1980  . Restless leg syndrome   . Seizure disorder ([place]) 1997   evaluated by [facility] in past  . Tinnitus 05/2009   DR BATES--related to change in med/generic.  Resolved  . Vitamin D deficiency     Past Surgical History:  Procedure Laterality Date  . CATARACT EXTRACTION Bilateral 11/2014, 01/2015   Dr. Herbert Deaner  . TONSILLECTOMY    . TUBAL LIGATION      Social History   Social History  . Marital status: Widowed    Spouse name: N/A  . Number of children: 2  . Years of education: N/A   Occupational History  . retired (from Insurance underwriter)    Social History Main Topics  . Smoking status: Never Smoker  . Smokeless tobacco: Never Used  . Alcohol use No  . Drug use: No  . Sexual activity: Not Currently   Other Topics Concern  . Not on file   Social History Narrative   Lives alone (in a one-level townhome). Has a bracelet she wears for assistance if she falls. Widowed 2009.  1 daughter in [place], 1 daughter in [place]. 5 grandchildren    Family History  Problem Relation Age of Onset  . Heart disease Mother   . Dementia Father   . Alzheimer's disease Sister   . Thyroid disease Sister        removed, thinks benign  . Diabetes Neg Hx   . Cancer Neg Hx     Outpatient Encounter Prescriptions as of 09/14/2016  Medication Sig Note  . cholecalciferol (VITAMIN D) 1000 UNITS tablet Take 1,000 Units by mouth daily.   . diphenhydramine-acetaminophen (TYLENOL PM) 25-500 MG TABS Take 1 tablet by mouth at bedtime as needed. Takes most nights 09/14/2016: Uses every night  . levETIRAcetam (KEPPRA) 500 MG tablet TAKE 1 TABLET BY MOUTH TWICE DAILY *NDC [phone]*   . lisinopril (PRINIVIL,ZESTRIL) 5 MG tablet TAKE 1 TABLET BY MOUTH EVERY DAY (Patient taking differently: taking half tablet  daily) 09/14/2016: Only taking 1/2 tablet daily over the last month  . acetaminophen (TYLENOL) 325 MG tablet Take 325 mg by mouth every 6 (six) hours as needed. Reported on 09/09/2015   . Chlorphen-Phenyleph-ASA (ALKA-SELTZER PLUS COLD PO) Take 1 tablet by mouth daily. 09/14/2016: Uses prn allergies/sinuses  . [DISCONTINUED] hydrocortisone valerate cream (WESTCORT) 0.2 % APPLY TO AFFECTED AREA TWICE A DAY FOR ITCHY RASH (Patient not taking: Reported on 09/14/2016)    No facility-administered encounter medications on file as of 09/14/2016.     Allergies  Allergen Reactions  . Sulfa Antibiotics Rash    ROS: The patient denies anorexia, fever, weight changes, headaches, seizures, vision changes, ear pain, sore throat, breast concerns, chest pain, palpitations, syncope, dyspnea on exertion, cough, swelling, nausea, vomiting, diarrhea, constipation, abdominal pain, melena, hematochezia, indigestion/heartburn (infrequent), vaginal bleeding, discharge, odor or itch, genital lesions, numbness, tingling, weakness, tremor, suspicious skin lesions, depression, abnormal bleeding/bruising, or enlarged lymph nodes.  Occasional heartburn with spicy food (relieved by pepcid or mylanta as needed). Rarely eats these foods +urinary incontinence (chronic), wears pads. Denies dysuria; not interested in medications. Mild sniffling and runny nose from allergies in the Spring, mostly in the morning she is bothered with runny nose. This is not daily Swelling in legs, L>R, unchanged/chronic. Some leg pain at night, behind her knees.  Theragesic helps. Spells of vertigo with nausea as per HPI (2 spells in the last month, none since lowering lisinopril).    PHYSICAL EXAM:  BP 120/78 (BP Location: Left Arm, Patient Position: Sitting, Cuff Size: Normal)   Pulse 68   Ht '5\' 1"'$  (1.549 m)   Wt 116 lb (52.6 kg)   BMI 21.92 kg/m   Wt Readings from Last 3 Encounters:  07/22/16 116 lb 9.6 oz (52.9 kg)  11/25/15 117 lb 6.4  oz (53.3 kg)  11/10/15 117 lb (53.1 kg)   General Appearance:  Alert, cooperative, no distress, appears stated age or slightly younger.   Head:  Normocephalic, without obvious abnormality, atraumatic   Eyes:  PERRL, conjunctiva/corneas clear, EOM's intact, fundi benign   Ears:  Normal TM's and external ear canals   Nose:  Nares normal, mucosa normal, pale, no drainage or sinus tenderness   Throat:  Tongue and teeth are normal.Mucosa is normal  Neck:  Supple, no lymphadenopathy; thyroid: no enlargement/tenderness/nodules; no carotid bruit or JVD   Back:  Spine nontender, no curvature, ROM normal, no CVA tenderness   Lungs:  Clear to auscultation bilaterally without wheezes, rales or ronchi; respirations unlabored   Chest Wall:  No tenderness or deformity   Heart:  Regular rate and rhythm, S1 and S2 normal, no murmur, rub or gallop   Breast Exam:  No tenderness, masses, or nipple discharge or inversion. No axillary lymphadenopathy   Abdomen:  Soft, non-tender, nondistended, normoactive bowel sounds, no masses, no hepatosplenomegaly   Genitalia:  Refused by patient today  Rectal:  Refused by patient today  Extremities:  No clubbing, cyanosis or edema   Pulses:  2+ and symmetric all extremities   Skin:  Skin color, texture, turgor normal, no rashes or lesions   Lymph nodes:  Cervical, supraclavicular, and axillary nodes normal   Neurologic:  CNII-XII intact, normal strength, sensation and gait; reflexes 2+ and symmetric throughout    Psych:   Normal mood, affect, hygiene and grooming   ASSESSMENT/PLAN:  Medicare annual wellness visit, subsequent  Seizure disorder ([place]) - continue Keppra  Essential hypertension - normal BP today, on 2.'5mg'$  lisinopril, but higher values at home. Cont 2.'5mg'$  and monitor; communicate concerns to cardiologist or here, esp if BP>140 - Plan: Lipid panel, Comprehensive  metabolic panel  Diastolic dysfunction - Plan: Lipid panel  Osteoporosis without current pathological fracture, unspecified osteoporosis type - risks of untreated osteoporosis reviewed. Encouraged weight bearing exercise in addition to Ca, D  Medication monitoring encounter - Plan: Comprehensive metabolic panel, CBC with Differential/Platelet  Immunization due - Plan: Pneumococcal polysaccharide vaccine 23-valent greater than or equal to 2yo subcutaneous/IM   Lipids, c-met, CBC Pneumovax booster given (last was 10 years ago). Shingrix recommended--risks/side effects reviewed  MOST form reviewed, updated.  Full Code, Full Care.  Risks of untreated osteoporosis reviewed Risks/benefits of mammograms reviewed. Reason for not going has been not wanting to drive to [facility].  She can ask her daughter to take her.  Discussed monthly self breast exams and yearly mammograms; at least 30 minutes of aerobic activity at least 5 days/week and  weight-bearing exercise 2x/week; proper sunscreen use reviewed; healthy diet, including goals of calcium and vitamin D intake and alcohol recommendations (less than or equal to 1 drink/day) reviewed; regular seatbelt use; changing batteries in smoke detectors. Immunization recommendations discussed--continue yearly flu shots. Pneumovax booster given today. Shingrix recommended.  Risks/side effects/benefits reviewed in detail. Colon cancer screening--UTD.  Repeat Cologard 08/2018.  Send copy of note and lipids/labs to Dr. Stanford Breed.  Pt advised: Your episode of vertigo, nausea and vomiting likely is NOT related to low blood pressure (more of an inner ear issue--next time you have this, please call our office to be evaluated) or the lisinopril.  You likely could safely resume the full '5mg'$  tablet, especially if blood pressure are consistently over 140 at home.  Your blood pressure today was very good.  I'm going to send this note to Dr. Stanford Breed just so he is  aware that you have decreased your dose.  Please schedule your mammogram and ask your daughter to drive you to your appointment.  Consider what we discussed about treating osteoporosis to prevent fractures.    Medicare Attestation I have personally reviewed: The patient's medical and social history Their use of alcohol, tobacco or illicit drugs Their current medications and supplements The patient's functional ability including ADLs,fall risks, home safety risks, cognitive, and hearing and visual impairment Diet and physical activities Evidence for depression or mood disorders  The patient's weight, height, and BMI have been recorded in the chart.  I have made referrals, counseling, and provided education to the patient based on review of the above and I have provided the patient with a written personalized care plan for preventive services.

## 2016-09-14 ENCOUNTER — Encounter: Payer: Self-pay | Admitting: Family Medicine

## 2016-09-14 ENCOUNTER — Ambulatory Visit (INDEPENDENT_AMBULATORY_CARE_PROVIDER_SITE_OTHER): Payer: Medicare Other | Admitting: Family Medicine

## 2016-09-14 VITALS — BP 120/78 | HR 68 | Ht 61.0 in | Wt 116.0 lb

## 2016-09-14 DIAGNOSIS — Z5181 Encounter for therapeutic drug level monitoring: Secondary | ICD-10-CM | POA: Diagnosis not present

## 2016-09-14 DIAGNOSIS — Z0000 Encounter for general adult medical examination without abnormal findings: Secondary | ICD-10-CM | POA: Diagnosis not present

## 2016-09-14 DIAGNOSIS — I519 Heart disease, unspecified: Secondary | ICD-10-CM

## 2016-09-14 DIAGNOSIS — M810 Age-related osteoporosis without current pathological fracture: Secondary | ICD-10-CM

## 2016-09-14 DIAGNOSIS — I10 Essential (primary) hypertension: Secondary | ICD-10-CM | POA: Diagnosis not present

## 2016-09-14 DIAGNOSIS — Z23 Encounter for immunization: Secondary | ICD-10-CM | POA: Diagnosis not present

## 2016-09-14 DIAGNOSIS — I5189 Other ill-defined heart diseases: Secondary | ICD-10-CM

## 2016-09-14 DIAGNOSIS — G40909 Epilepsy, unspecified, not intractable, without status epilepticus: Secondary | ICD-10-CM

## 2016-09-14 LAB — COMPREHENSIVE METABOLIC PANEL
ALBUMIN: 4.4 g/dL (ref 3.6–5.1)
ALT: 10 U/L (ref 6–29)
AST: 18 U/L (ref 10–35)
Alkaline Phosphatase: 30 U/L — ABNORMAL LOW (ref 33–130)
BUN: 13 mg/dL (ref 7–25)
CO2: 27 mmol/L (ref 20–31)
Calcium: 9.6 mg/dL (ref 8.6–10.4)
Chloride: 98 mmol/L (ref 98–110)
Creat: 0.81 mg/dL (ref 0.60–0.88)
Glucose, Bld: 97 mg/dL (ref 65–99)
POTASSIUM: 5 mmol/L (ref 3.5–5.3)
Sodium: 135 mmol/L (ref 135–146)
TOTAL PROTEIN: 7.3 g/dL (ref 6.1–8.1)
Total Bilirubin: 0.4 mg/dL (ref 0.2–1.2)

## 2016-09-14 LAB — CBC WITH DIFFERENTIAL/PLATELET
BASOS PCT: 1 %
Basophils Absolute: 64 cells/uL (ref 0–200)
Eosinophils Absolute: 64 cells/uL (ref 15–500)
Eosinophils Relative: 1 %
HCT: 41.8 % (ref 35.0–45.0)
HEMOGLOBIN: 13.6 g/dL (ref 11.7–15.5)
LYMPHS ABS: 1280 {cells}/uL (ref 850–3900)
Lymphocytes Relative: 20 %
MCH: 28.1 pg (ref 27.0–33.0)
MCHC: 32.5 g/dL (ref 32.0–36.0)
MCV: 86.4 fL (ref 80.0–100.0)
MPV: 10 fL (ref 7.5–12.5)
Monocytes Absolute: 448 cells/uL (ref 200–950)
Monocytes Relative: 7 %
NEUTROS ABS: 4544 {cells}/uL (ref 1500–7800)
Neutrophils Relative %: 71 %
Platelets: 223 10*3/uL (ref 140–400)
RBC: 4.84 MIL/uL (ref 3.80–5.10)
RDW: 15.3 % — AB (ref 11.0–15.0)
WBC: 6.4 10*3/uL (ref 4.0–10.5)

## 2016-09-14 LAB — LIPID PANEL
Cholesterol: 212 mg/dL — ABNORMAL HIGH (ref <200)
HDL: 95 mg/dL (ref >50)
LDL CALC: 98 mg/dL (ref <100)
TRIGLYCERIDES: 97 mg/dL (ref <150)
Total CHOL/HDL Ratio: 2.2 Ratio (ref <5.0)
VLDL: 19 mg/dL (ref <30)

## 2016-09-14 NOTE — Patient Instructions (Addendum)
HEALTH MAINTENANCE RECOMMENDATIONS:  It is recommended that you get at least 30 minutes of aerobic exercise at least 5 days/week (for weight loss, you may need as much as 60-90 minutes). This can be any activity that gets your heart rate up. This can be divided in 10-15 minute intervals if needed, but try and build up your endurance at least once a week.  Weight bearing exercise is also recommended twice weekly.  Eat a healthy diet with lots of vegetables, fruits and fiber.  "Colorful" foods have a lot of vitamins (ie green vegetables, tomatoes, red peppers, etc).  Limit sweet tea, regular sodas and alcoholic beverages, all of which has a lot of calories and sugar.  Up to 1 alcoholic drink daily may be beneficial for women (unless trying to lose weight, watch sugars).  Drink a lot of water.  Calcium recommendations are 1200-1500 mg daily (1500 mg for postmenopausal women or women without ovaries), and vitamin D 1000 IU daily.  This should be obtained from diet and/or supplements (vitamins), and calcium should not be taken all at once, but in divided doses.  Monthly self breast exams and yearly mammograms for women over the age of 58 is recommended.  Sunscreen of at least SPF 30 should be used on all sun-exposed parts of the skin when outside between the hours of 10 am and 4 pm (not just when at beach or pool, but even with exercise, golf, tennis, and yard work!)  Use a sunscreen that says "broad spectrum" so it covers both UVA and UVB rays, and make sure to reapply every 1-2 hours.  Remember to change the batteries in your smoke detectors when changing your clock times in the spring and fall.  Use your seat belt every time you are in a car, and please drive safely and not be distracted with cell phones and texting while driving.   Alicia [name] , Thank you for taking time to come for your Medicare Wellness Visit. I appreciate your ongoing commitment to your health goals. Please review the following  plan we discussed and let me know if I can assist you in the future.   These are the goals we discussed: Goals    None      This is a list of the screening recommended for you and due dates:  Health Maintenance  Topic Date Due  . Flu Shot  11/23/2016  . Tetanus Vaccine  07/24/2021  . DEXA scan (bone density measurement)  Completed  . Pneumonia vaccines  Completed    Your episode of vertigo, nausea and vomiting likely is NOT related to low blood pressure (more of an inner ear issue--next time you have this, please call our office to be evaluated) or the lisinopril.  You likely could safely resume the full 5mg  tablet, especially if blood pressure are consistently over 140 at home.  Your blood pressure today was very good.  I'm going to send this note to Dr. Jens Som just so he is aware that you have decreased your dose.  Please schedule your mammogram and ask your daughter to drive you to your appointment.  Consider what we discussed about treating osteoporosis to prevent fractures.  I recommend getting the new shingles vaccine (Shingrix). You will need to check with your insurance to see if it is covered, and if covered by Medicare Part D, you need to get from the pharmacy rather than our office.  It is a series of 2 injections, spaced 2 months apart.  Next colon cancer screening (with Cologard) will be due 08/2018.

## 2016-09-21 DIAGNOSIS — H01119 Allergic dermatitis of unspecified eye, unspecified eyelid: Secondary | ICD-10-CM | POA: Diagnosis not present

## 2016-09-22 ENCOUNTER — Other Ambulatory Visit: Payer: Self-pay | Admitting: *Deleted

## 2016-09-22 DIAGNOSIS — G40909 Epilepsy, unspecified, not intractable, without status epilepticus: Secondary | ICD-10-CM

## 2016-09-22 MED ORDER — LEVETIRACETAM 500 MG PO TABS: ORAL_TABLET | ORAL | 3 refills | Status: DC

## 2016-09-30 ENCOUNTER — Telehealth: Payer: Self-pay | Admitting: Cardiology

## 2016-09-30 MED ORDER — LISINOPRIL 2.5 MG PO TABS: 2.5000 mg | ORAL_TABLET | Freq: Every day | ORAL | 2 refills | Status: DC

## 2016-09-30 NOTE — Telephone Encounter (Signed)
New message        *STAT* If patient is at the pharmacy, call can be transferred to refill team.   1. Which medications need to be refilled? (please list name of each medication and dose if known) lisinopril 2.5mg  (pt was cutting 5mg  tablet in half---want 2.5mg  pill) 2. Which pharmacy/location (including street and city if local pharmacy) is [facility] [address]  3. Do they need a 30 day or 90 day supply? 90 day if ins will pay

## 2016-11-17 DIAGNOSIS — L821 Other seborrheic keratosis: Secondary | ICD-10-CM | POA: Diagnosis not present

## 2016-11-17 DIAGNOSIS — L814 Other melanin hyperpigmentation: Secondary | ICD-10-CM | POA: Diagnosis not present

## 2016-12-28 DIAGNOSIS — Z23 Encounter for immunization: Secondary | ICD-10-CM | POA: Diagnosis not present

## 2017-06-21 ENCOUNTER — Other Ambulatory Visit: Payer: Self-pay | Admitting: Cardiology

## 2017-06-21 NOTE — Telephone Encounter (Signed)
REFILL 

## 2017-07-26 ENCOUNTER — Telehealth: Payer: Self-pay | Admitting: Family Medicine

## 2017-07-26 DIAGNOSIS — G40909 Epilepsy, unspecified, not intractable, without status epilepticus: Secondary | ICD-10-CM

## 2017-07-26 MED ORDER — LEVETIRACETAM 500 MG PO TABS: ORAL_TABLET | ORAL | 0 refills | Status: DC

## 2017-07-26 NOTE — Telephone Encounter (Signed)
So this is also a generic that patient need "mylan" manufacturer for due to ringing in ears. I called 10 different pharmacies and no one had this is stock. [facility] said they may be able to get it 08/06/17. Patient said she has 3 tabs left and would be okay with another manufacturer for a 30 day trial and then trying to get the Mylan again in a few weeks. I will send for her if this okay.

## 2017-07-26 NOTE — Addendum Note (Signed)
Addended by: Debbrah AlarSMITH, Solomon Skowronek F on: 07/26/2017 04:25 PM   Modules accepted: Orders

## 2017-07-26 NOTE — Telephone Encounter (Signed)
Please check with pt and/or pharmacy. I'm not aware of any issues with Keppra

## 2017-07-26 NOTE — Telephone Encounter (Signed)
Pt come in and states that she can not get her levetracetam 500 mg the [facility] not [facility] they are out of stock and other stores are not carrying it either, states that one of the ingredients in it is the cause of it, states she is running out of the medicines, and is not sure what to do,  Please advise, [id number] [place] [place] - [address] pt can be reached at [phone]

## 2017-07-26 NOTE — Telephone Encounter (Signed)
Ok for the 30d of another manufacturer while she waits on [facility] getting their supply.

## 2017-08-18 ENCOUNTER — Other Ambulatory Visit: Payer: Self-pay | Admitting: Family Medicine

## 2017-08-18 DIAGNOSIS — G40909 Epilepsy, unspecified, not intractable, without status epilepticus: Secondary | ICD-10-CM

## 2017-09-10 ENCOUNTER — Other Ambulatory Visit: Payer: Self-pay | Admitting: Family Medicine

## 2017-09-10 DIAGNOSIS — G40909 Epilepsy, unspecified, not intractable, without status epilepticus: Secondary | ICD-10-CM

## 2017-09-12 ENCOUNTER — Other Ambulatory Visit: Payer: Self-pay | Admitting: Cardiology

## 2017-09-12 NOTE — Telephone Encounter (Signed)
REFILL 

## 2017-09-20 ENCOUNTER — Ambulatory Visit: Payer: Medicare Other | Admitting: Family Medicine

## 2017-10-10 NOTE — Progress Notes (Signed)
Chief Complaint  Patient presents with  . Medicare Wellness    fasting (had coffee w/fat free milk) AWV with pelvic (states that insurance did not pay for pap in 2016) I told her I was not sure if  there was a separate charge for a pelvic like there is for a pap. No concerns today.     Alicia [name] is a 84 y.o. female who presents for annual wellness visit and follow-up on chronic medical conditions.  She has the following concerns:  Hypertension and diastolic dysfunction (grade 1 per echo 11/2015). Lisinopril was switched to Cozaar due to dry cough, but she reportedly did not feel better while on losartan and later switched back to his lisinopril. Currently, she is on 2.5 mg lisinopril; her blood pressure is consistently in the 130-140's, rarely higher, recalls that diastolic is "fine". Cannot use the monitor she has at home.. She denies any cough (just intermittent, related to allergies). She last saw cardiologist 07/22/16. At that time she had been on '5mg'$  tablet.  Dose was further cut back to 2.'5mg'$  due to some spells of dizziness.  She has not been back to see cardiologist for f/u (per March 2018 note, 6 month f/u was recommended; no f/u appt scheduled at all). He refilled her medication last month for 90d.  Osteoporosis: She drinks 2-3 glasses of milk daily. She has declined any treatment for osteoporosis in the past, refusing to take any more medications. She continues to have no interest in discussing treatment of osteoporosis. Still takes Vitamin D and gets weight-bearing exercise at the gym 2x/week.  Seizure disorder: she hasn't had any seizures since being changed to just the Keppra alone (lamictal stopped). Last seizure was probably in 1998. Had to take a different generic (other than Mylan) for a few days, while waiting for the Mylan to come in, and didn't have problems. Just recently got the Mylan generic keppra refilled.   Immunization History  Administered Date(s) Administered   . Influenza Split 03/01/2012  . Influenza, High Dose Seasonal PF 01/19/2014, 01/19/2015, 12/30/2015  . Influenza-Unspecified 12/24/2012, 12/28/2016  . Pneumococcal Conjugate-13 08/13/2014  . Pneumococcal Polysaccharide-23 04/26/1999, 09/01/2006, 09/14/2016  . Td 09/01/2006  . Tdap 07/25/2011  . Zoster 09/10/2015   Last Pap smear: 07/2014, normal, with no high risk HPV present Last mammogram: 12/2013(doesn't like the "traffic" to get to [address] for appointment, only reason she hasn't gotten it again). Last colonoscopy: Per Dr. Naaman Plummer first note at [facility], said 2 years prior (so would have been approx 2006); negative Cologard 08/2015 Last DEXA: 03/2010 (shows osteoporosis, but pt refuses treatment) Ophtho: goes yearly  Dentist: recently started going to Dr. Johney Maine, going 2x/year. [facility]: [facility], does [employer] classes (includes weights). She does the treadmill occasionally in the winter ,if available when she is there. No exercise outside of going to the gym.  Lipids: Lab Results  Component Value Date   CHOL 212 (H) 09/14/2016   HDL 95 09/14/2016   LDLCALC 98 09/14/2016   TRIG 97 09/14/2016   CHOLHDL 2.2 09/14/2016   Vitamin D level 07/2011: 59 (on same supplements)  Other doctors caring for patient include: Cardiologist: Dr. Stanford Breed Dentist: Dr. Johney Maine ophtho--Dr. Rebeca Allegra at [place] for glasses, Dr. Herbert Deaner for cataracts Neuro: at [place] (last seen 07/2013) Dr. Jeoffrey Massed   Depression screen: negative Fall screen: negative Functional status screen: Notable for urinary leakage (wears pads constantly, unchanged).  No longer having ringing in her  ear (since she stays on Mylan brand of her medication) or other concerns. Mini-cog screen: normal (5) See full screen in Epic.   End of Life Discussion: Patient hasa living will and medical power of attorney  Past Medical History:  Diagnosis Date  . Eye exam  abnormal 11/21/14   Dr.Hecker-glaucoma suspect, cataracts, hemorrage  . GERD (gastroesophageal reflux disease)   . Hx of migraines   . Hyperlipidemia   . Macular degeneration   . Osteoporosis    DEXA 03/2010 T-3.2 R hip; declines meds  . PUD (peptic ulcer disease) 1980  . Restless leg syndrome   . Seizure disorder ([place]) 1997   evaluated by [facility] in past  . Tinnitus 05/2009   DR BATES--related to change in med/generic.  Resolved  . Vitamin D deficiency     Past Surgical History:  Procedure Laterality Date  . CATARACT EXTRACTION Bilateral 11/2014, 01/2015   Dr. Herbert Deaner  . TONSILLECTOMY    . TUBAL LIGATION      Social History   Socioeconomic History  . Marital status: Widowed    Spouse name: Not on file  . Number of children: 2  . Years of education: Not on file  . Highest education level: Not on file  Occupational History  . Occupation: retired (from Insurance underwriter)  Social Needs  . Financial resource strain: Not on file  . Food insecurity:    Worry: Not on file    Inability: Not on file  . Transportation needs:    Medical: Not on file    Non-medical: Not on file  Tobacco Use  . Smoking status: Never Smoker  . Smokeless tobacco: Never Used  Substance and Sexual Activity  . Alcohol use: No  . Drug use: No  . Sexual activity: Not Currently  Lifestyle  . Physical activity:    Days per week: Not on file    Minutes per session: Not on file  . Stress: Not on file  Relationships  . Social connections:    Talks on phone: Not on file    Gets together: Not on file    Attends religious service: Not on file    Active member of club or organization: Not on file    Attends meetings of clubs or organizations: Not on file    Relationship status: Not on file  . Intimate partner violence:    Fear of current or ex partner: Not on file    Emotionally abused: Not on file    Physically abused: Not on file    Forced sexual activity: Not on file  Other Topics Concern  . Not on file   Social History Narrative   Lives alone (in a one-level townhome). Has a bracelet she wears for assistance if she falls. Widowed 2009.  1 daughter in [place], 1 daughter in [place]. 5 grandchildren. Great-grandchild expected in July.    Family History  Problem Relation Age of Onset  . Heart disease Mother   . Dementia Father   . Alzheimer's disease Sister   . Heart disease Brother        46's  . Thyroid disease Sister        removed, thinks benign  . Diabetes Neg Hx   . Cancer Neg Hx     Outpatient Encounter Medications as of 10/11/2017  Medication Sig Note  . cholecalciferol (VITAMIN D) 1000 UNITS tablet Take 1,000 Units by mouth daily.   . diphenhydramine-acetaminophen (TYLENOL PM) 25-500 MG TABS Take 1 tablet by mouth  at bedtime as needed. Takes most nights 09/14/2016: Uses every night  . levETIRAcetam (KEPPRA) 500 MG tablet TAKE 1 TABLET BY MOUTH TWICE A DAY   . lisinopril (PRINIVIL,ZESTRIL) 2.5 MG tablet TAKE 1 TABLET (2.5 MG TOTAL) BY MOUTH DAILY. (NEED OFFICE VISIT)   . acetaminophen (TYLENOL) 325 MG tablet Take 325 mg by mouth every 6 (six) hours as needed. Reported on 09/09/2015   . Chlorphen-Phenyleph-ASA (ALKA-SELTZER PLUS COLD PO) Take 1 tablet by mouth daily. 09/14/2016: Uses prn allergies/sinuses   No facility-administered encounter medications on file as of 10/11/2017.     Allergies  Allergen Reactions  . Sulfa Antibiotics Rash   ROS: The patient denies anorexia, fever, weight changes, headaches, seizures (many years ago, compliant with her medications), vision changes, ear pain, sore throat, breast concerns, chest pain, palpitations, syncope, dyspnea on exertion, cough (occcasional, related to allergies/PND), swelling, nausea, vomiting, diarrhea, constipation, abdominal pain, melena, hematochezia, vaginal bleeding, discharge, odor or itch, genital lesions, numbness, tingling, weakness, tremor, suspicious skin lesions, depression, abnormal bleeding/bruising, or enlarged lymph  nodes.  Occasional heartburn with spicy food (relieved by pepcid tablet as needed). Rarely eats these foods, so rare symptoms +urinary incontinence (chronic), wears pads. Denies dysuria; not interested in medications. Mild sniffling and runny nose from allergies in the Spring, mostly in the morning she is bothered with runny nose. she uses alka selzer plus nose spray in the mornings, if needed. This is not daily. Sometimes uses flonase, not regularly. Runny nose is better later in the day. Swelling in legs, L>R, unchanged/chronic. Some leg pain at night, behind her knees.  Theragesic helps. Occasional hemorrhoids, no recent bleeding.   PHYSICAL EXAM:  BP 138/80   Pulse 80   Ht '5\' 1"'$  (1.549 m)   Wt 115 lb 6.4 oz (52.3 kg)   BMI 21.80 kg/m   Wt Readings from Last 3 Encounters:  10/11/17 115 lb 6.4 oz (52.3 kg)  09/14/16 116 lb (52.6 kg)  07/22/16 116 lb 9.6 oz (52.9 kg)    General Appearance:  Alert, cooperative, no distress, appears stated age or slightly younger.   Head:  Normocephalic, without obvious abnormality, atraumatic   Eyes:  PERRL, conjunctiva/corneas clear, EOM's intact, fundi benign   Ears:  Normal TM's and external ear canals   Nose:  Nares normal, mucosa normal, pale, some clear drainage and crusting noted on the left; no sinus tenderness   Throat:  Tongue and teeth are normal.Mucosa is normal  Neck:  Supple, no lymphadenopathy; thyroid: no enlargement/ tenderness/nodules; no carotid bruit or JVD   Back:  Spine nontender, no curvature, ROM normal, no CVA tenderness   Lungs:  Clear to auscultation bilaterally without wheezes, rales or ronchi; respirations unlabored   Chest Wall:  No tenderness or deformity   Heart:  Regular rate and rhythm, S1 and S2 normal, no rub or gallop; 2/6 SEM notedat RUSB  Breast Exam:  No tenderness, masses, or nipple discharge or inversion. No axillary lymphadenopathy   Abdomen:  Soft,  non-tender, nondistended, normoactive bowel sounds, no masses, no hepatosplenomegaly   Genitalia:  Atrophic changes, with mild erythema of both labia (where it comes in contact with pads/moisture, very focal).  No lesions, sores. No vaginal discharge. No cervical motion tenderness, uterine or adnexal masses or tenderness.  She was very anxious about leaking urine during the exam (tensed up some)  Rectal:  Normal sphincter tone, some hard stool, trace heme +. No masses  Extremities:  No clubbing, cyanosis or edema   Pulses:  2+ and symmetric all extremities   Skin:  Skin color, texture, turgor normal, no rashes or lesions   Lymph nodes:  Cervical, supraclavicular, and axillary nodes normal   Neurologic:  CNII-XII intact, normal strength, sensation and gait; reflexes 2+ and symmetric throughout    Psych:   Normal mood, affect, hygiene and grooming   ASSESSMENT/PLAN:  Medicare annual wellness visit, subsequent  Essential hypertension - controlled on low dose lisinopril; borderline high, but acceptable.  Due to f/u with cardiology (who rx's med) - Plan: Comprehensive metabolic panel  Diastolic dysfunction  Osteoporosis without current pathological fracture, unspecified osteoporosis type - reviewed risks of untreated osteoporosis; declines medications, briefly reviewed. Cont Ca, D, weight-bearing exercise - Plan: TSH  Seizure disorder ([place]) - doing well on current regimen, continue.  Denies needing refill today. Prefers to only take Mylan generic (due to SE including tinnitus from other generics)  Medication monitoring encounter - Plan: Comprehensive metabolic panel, CBC with Differential/Platelet, TSH  Fatigue, unspecified type - Plan: Comprehensive metabolic panel, CBC with Differential/Platelet, TSH  Trace heme+stool.  W/u only if abnl CBC, given h/o hemorrhoids.  c-met, CBC, TSH  MOST form reviewed, updated.  Full Code, Full Care.  Risks  of untreated osteoporosis reviewed Risks/benefits of mammograms reviewed. Reason for not going has been not wanting to drive to [facility].  She can ask her daughter to take her if still unwilling to drive there.  She thinks she can do it. Discussed reasons for mammograms   Discussed monthly self breast exams and yearly mammograms; at least 30 minutes of aerobic activity at least 5 days/week and weight-bearing exercise 2x/week; proper sunscreen use reviewed; healthy diet, including goals of calcium and vitamin D intake and alcohol recommendations (less than or equal to 1 drink/day) reviewed; regular seatbelt use; changing batteries in smoke detectors. Immunization recommendations discussed--continue yearly flu shots. Shingrix recommended. Risks/side effects/benefits reviewed in detail. To get from pharmacy. Colon cancer screening--UTD.  Repeat Cologard 08/2018.  F/u 1 year, sooner prn.  Medicare Attestation I have personally reviewed: The patient's medical and social history Their use of alcohol, tobacco or illicit drugs Their current medications and supplements The patient's functional ability including ADLs,fall risks, home safety risks, cognitive, and hearing and visual impairment Diet and physical activities Evidence for depression or mood disorders  The patient's weight, height and BMI have been recorded in the chart.  I have made referrals, counseling, and provided education to the patient based on review of the above and I have provided the patient with a written personalized care plan for preventive services.

## 2017-10-11 ENCOUNTER — Encounter: Payer: Self-pay | Admitting: Family Medicine

## 2017-10-11 ENCOUNTER — Ambulatory Visit (INDEPENDENT_AMBULATORY_CARE_PROVIDER_SITE_OTHER): Payer: Medicare Other | Admitting: Family Medicine

## 2017-10-11 VITALS — BP 138/80 | HR 80 | Ht 61.0 in | Wt 115.4 lb

## 2017-10-11 DIAGNOSIS — I5189 Other ill-defined heart diseases: Secondary | ICD-10-CM

## 2017-10-11 DIAGNOSIS — M810 Age-related osteoporosis without current pathological fracture: Secondary | ICD-10-CM | POA: Diagnosis not present

## 2017-10-11 DIAGNOSIS — Z0000 Encounter for general adult medical examination without abnormal findings: Secondary | ICD-10-CM | POA: Diagnosis not present

## 2017-10-11 DIAGNOSIS — G40909 Epilepsy, unspecified, not intractable, without status epilepticus: Secondary | ICD-10-CM

## 2017-10-11 DIAGNOSIS — R5383 Other fatigue: Secondary | ICD-10-CM

## 2017-10-11 DIAGNOSIS — Z5181 Encounter for therapeutic drug level monitoring: Secondary | ICD-10-CM

## 2017-10-11 DIAGNOSIS — I10 Essential (primary) hypertension: Secondary | ICD-10-CM | POA: Diagnosis not present

## 2017-10-11 NOTE — Patient Instructions (Addendum)
  HEALTH MAINTENANCE RECOMMENDATIONS:  It is recommended that you get at least 30 minutes of aerobic exercise at least 5 days/week (for weight loss, you may need as much as 60-90 minutes). This can be any activity that gets your heart rate up. This can be divided in 10-15 minute intervals if needed, but try and build up your endurance at least once a week.  Weight bearing exercise is also recommended twice weekly.  Eat a healthy diet with lots of vegetables, fruits and fiber.  "Colorful" foods have a lot of vitamins (ie green vegetables, tomatoes, red peppers, etc).  Limit sweet tea, regular sodas and alcoholic beverages, all of which has a lot of calories and sugar.  Up to 1 alcoholic drink daily may be beneficial for women (unless trying to lose weight, watch sugars).  Drink a lot of water.  Calcium recommendations are 1200-1500 mg daily (1500 mg for postmenopausal women or women without ovaries), and vitamin D 1000 IU daily.  This should be obtained from diet and/or supplements (vitamins), and calcium should not be taken all at once, but in divided doses.  Monthly self breast exams and yearly mammograms for women over the age of 11040 is recommended.  Sunscreen of at least SPF 30 should be used on all sun-exposed parts of the skin when outside between the hours of 10 am and 4 pm (not just when at beach or pool, but even with exercise, golf, tennis, and yard work!)  Use a sunscreen that says "broad spectrum" so it covers both UVA and UVB rays, and make sure to reapply every 1-2 hours.  Remember to change the batteries in your smoke detectors when changing your clock times in the spring and fall.  Use your seat belt every time you are in a car, and please drive safely and not be distracted with cell phones and texting while driving.   Ms. Alicia [name] , Thank you for taking time to come for your Medicare Wellness Visit. I appreciate your ongoing commitment to your health goals. Please review the following  plan we discussed and let me know if I can assist you in the future.   These are the goals we discussed: Goals    None      This is a list of the screening recommended for you and due dates:  Health Maintenance  Topic Date Due  . Flu Shot  11/23/2017  . Tetanus Vaccine  07/24/2021  . DEXA scan (bone density measurement)  Completed  . Pneumonia vaccines  Completed   Colon Cancer screening:  You will be due to repeat the Cologard testing next year.  Please Call [facility] to schedule your 3D mammogram.  Use the card given to help remind you of how to properly do a breast exam, but the phone number on the card is for the Breast Center.  This is NOT where you went last. [facility] so that they can compare to your prior mammogram.  I recommend getting the new shingles vaccine (Shingrix). You will need to check with your insurance to see if it is covered, and if covered by Medicare Part D, you need to get from the pharmacy rather than our office.  It is a series of 2 injections, spaced 2 months apart.  I believe you are past due to see Dr. Jens Somrenshaw.  Your blood pressure was fine today on your current medication. There is a slight murmur heard today.

## 2017-10-12 ENCOUNTER — Encounter: Payer: Self-pay | Admitting: Family Medicine

## 2017-10-12 LAB — CBC WITH DIFFERENTIAL/PLATELET
BASOS ABS: 0 10*3/uL (ref 0.0–0.2)
Basos: 0 %
EOS (ABSOLUTE): 0 10*3/uL (ref 0.0–0.4)
Eos: 1 %
HEMOGLOBIN: 13.4 g/dL (ref 11.1–15.9)
Hematocrit: 42.2 % (ref 34.0–46.6)
IMMATURE GRANS (ABS): 0 10*3/uL (ref 0.0–0.1)
IMMATURE GRANULOCYTES: 0 %
LYMPHS: 18 %
Lymphocytes Absolute: 1.4 10*3/uL (ref 0.7–3.1)
MCH: 28.1 pg (ref 26.6–33.0)
MCHC: 31.8 g/dL (ref 31.5–35.7)
MCV: 89 fL (ref 79–97)
MONOCYTES: 6 %
Monocytes Absolute: 0.5 10*3/uL (ref 0.1–0.9)
NEUTROS ABS: 6 10*3/uL (ref 1.4–7.0)
NEUTROS PCT: 75 %
Platelets: 154 10*3/uL (ref 150–450)
RBC: 4.77 x10E6/uL (ref 3.77–5.28)
RDW: 15.2 % (ref 12.3–15.4)
WBC: 7.9 10*3/uL (ref 3.4–10.8)

## 2017-10-12 LAB — COMPREHENSIVE METABOLIC PANEL
A/G RATIO: 1.9 (ref 1.2–2.2)
ALT: 11 IU/L (ref 0–32)
AST: 20 IU/L (ref 0–40)
Albumin: 4.7 g/dL (ref 3.5–4.7)
Alkaline Phosphatase: 33 IU/L — ABNORMAL LOW (ref 39–117)
BILIRUBIN TOTAL: 0.3 mg/dL (ref 0.0–1.2)
BUN/Creatinine Ratio: 13 (ref 12–28)
BUN: 10 mg/dL (ref 8–27)
CHLORIDE: 96 mmol/L (ref 96–106)
CO2: 23 mmol/L (ref 20–29)
Calcium: 9.3 mg/dL (ref 8.7–10.3)
Creatinine, Ser: 0.75 mg/dL (ref 0.57–1.00)
GFR calc Af Amer: 86 mL/min/{1.73_m2} (ref >59)
GFR calc non Af Amer: 74 mL/min/{1.73_m2} (ref >59)
GLUCOSE: 93 mg/dL (ref 65–99)
Globulin, Total: 2.5 g/dL (ref 1.5–4.5)
POTASSIUM: 4.4 mmol/L (ref 3.5–5.2)
Sodium: 135 mmol/L (ref 134–144)
TOTAL PROTEIN: 7.2 g/dL (ref 6.0–8.5)

## 2017-10-12 LAB — TSH: TSH: 1.15 u[IU]/mL (ref 0.450–4.500)

## 2017-11-04 ENCOUNTER — Other Ambulatory Visit: Payer: Self-pay | Admitting: Family Medicine

## 2017-11-04 DIAGNOSIS — G40909 Epilepsy, unspecified, not intractable, without status epilepticus: Secondary | ICD-10-CM

## 2017-11-06 NOTE — Telephone Encounter (Signed)
Is this okay to refill and for how long?

## 2017-11-27 ENCOUNTER — Other Ambulatory Visit: Payer: Self-pay

## 2017-12-09 ENCOUNTER — Other Ambulatory Visit: Payer: Self-pay | Admitting: Cardiology

## 2018-01-11 DIAGNOSIS — Z23 Encounter for immunization: Secondary | ICD-10-CM | POA: Diagnosis not present

## 2018-03-06 ENCOUNTER — Other Ambulatory Visit: Payer: Self-pay | Admitting: Cardiology

## 2018-03-06 NOTE — Telephone Encounter (Signed)
Rx request sent to pharmacy.  

## 2018-03-19 ENCOUNTER — Other Ambulatory Visit: Payer: Self-pay | Admitting: Cardiology

## 2018-03-19 MED ORDER — LISINOPRIL 2.5 MG PO TABS: 2.5000 mg | ORAL_TABLET | Freq: Every day | ORAL | 0 refills | Status: DC

## 2018-03-19 NOTE — Telephone Encounter (Signed)
°*  STAT* If patient is at the pharmacy, call can be transferred to refill team.   1. Which medications need to be refilled? (please list name of each medication and dose if known)   lisinopril (PRINIVIL,ZESTRIL) 2.5 MG tablet    2. Which pharmacy/location (including street and city if local pharmacy) is medication to be sent to?  [id number] [place] [place] - [address]   3. Do they need a 30 day or 90 day supply?  90 days

## 2018-03-22 IMAGING — CR DG CHEST 2V
2 series · 2 of 2 positions shown · non-contrast
Comparison: None.

CLINICAL DATA: Palpitations, chest pressure for 1 month

EXAM:
CHEST  2 VIEW

[w chest pa]
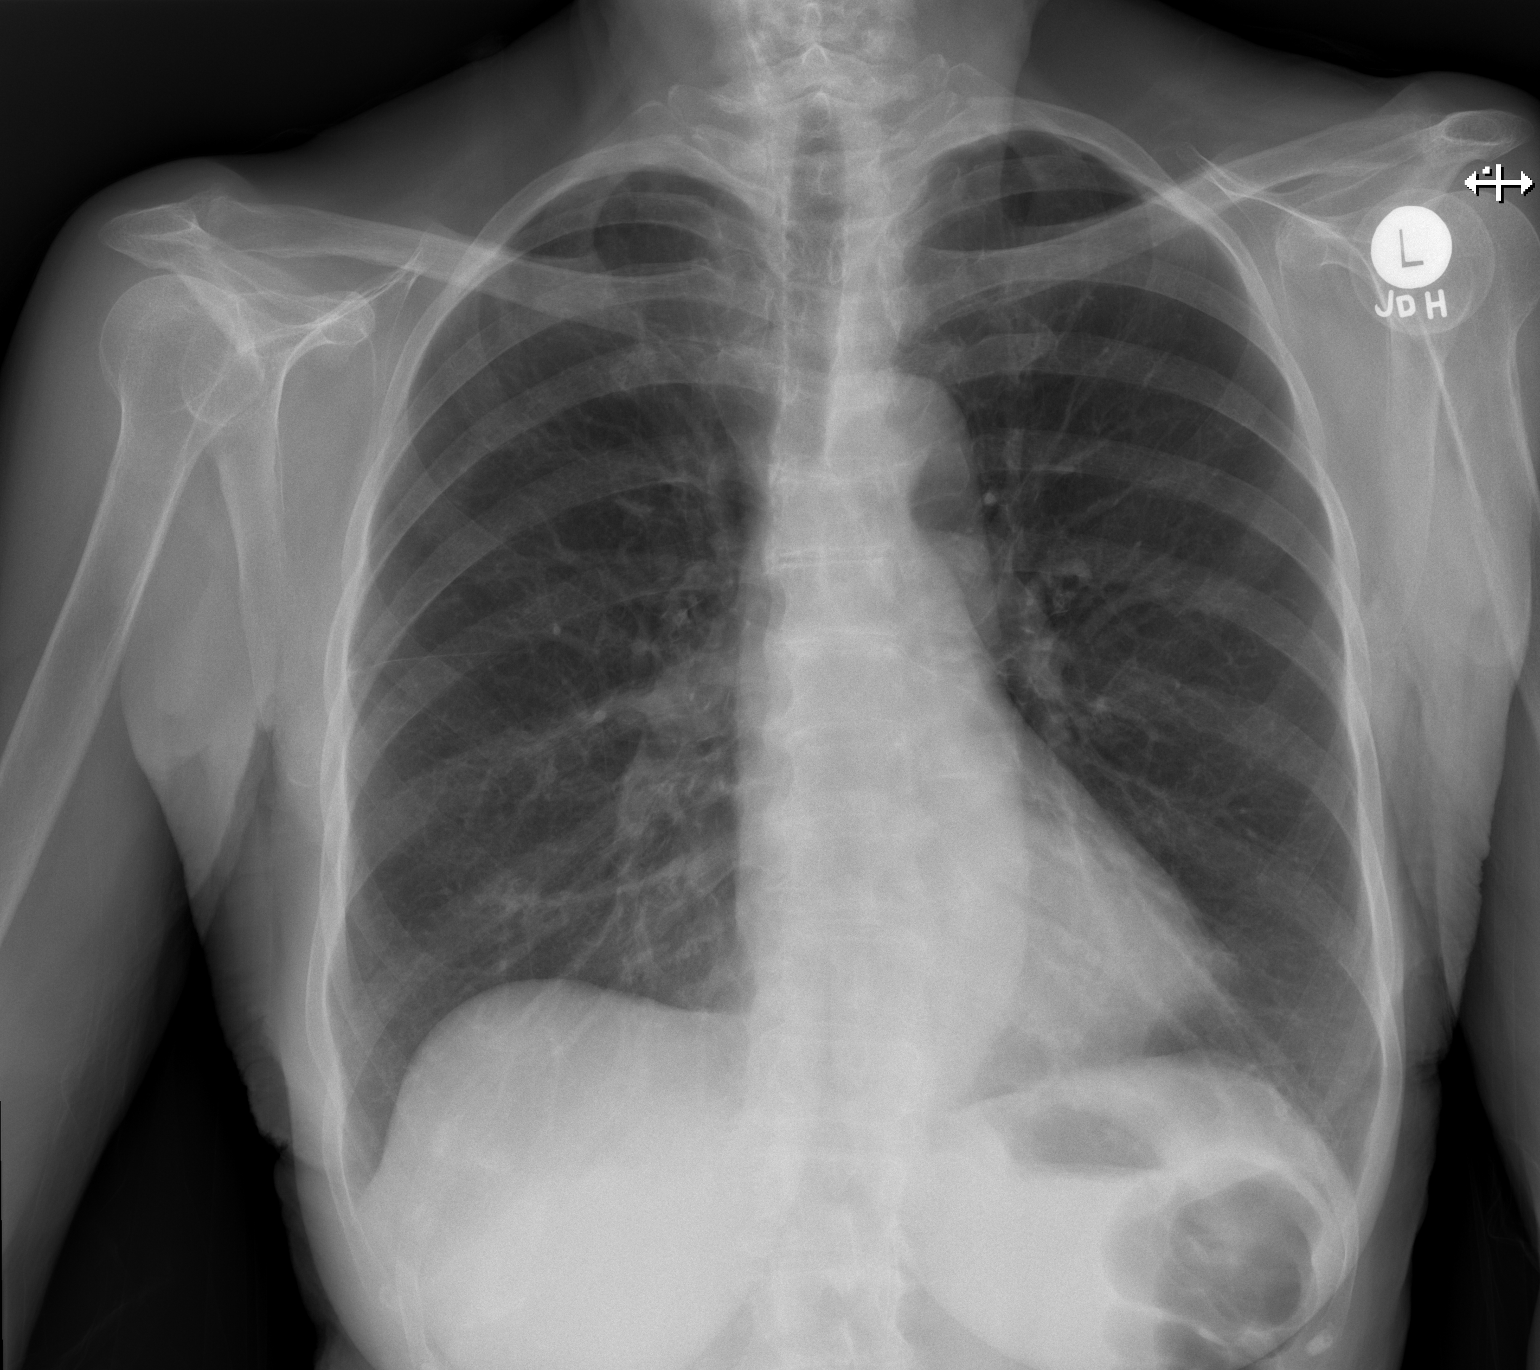

[w chest lat]
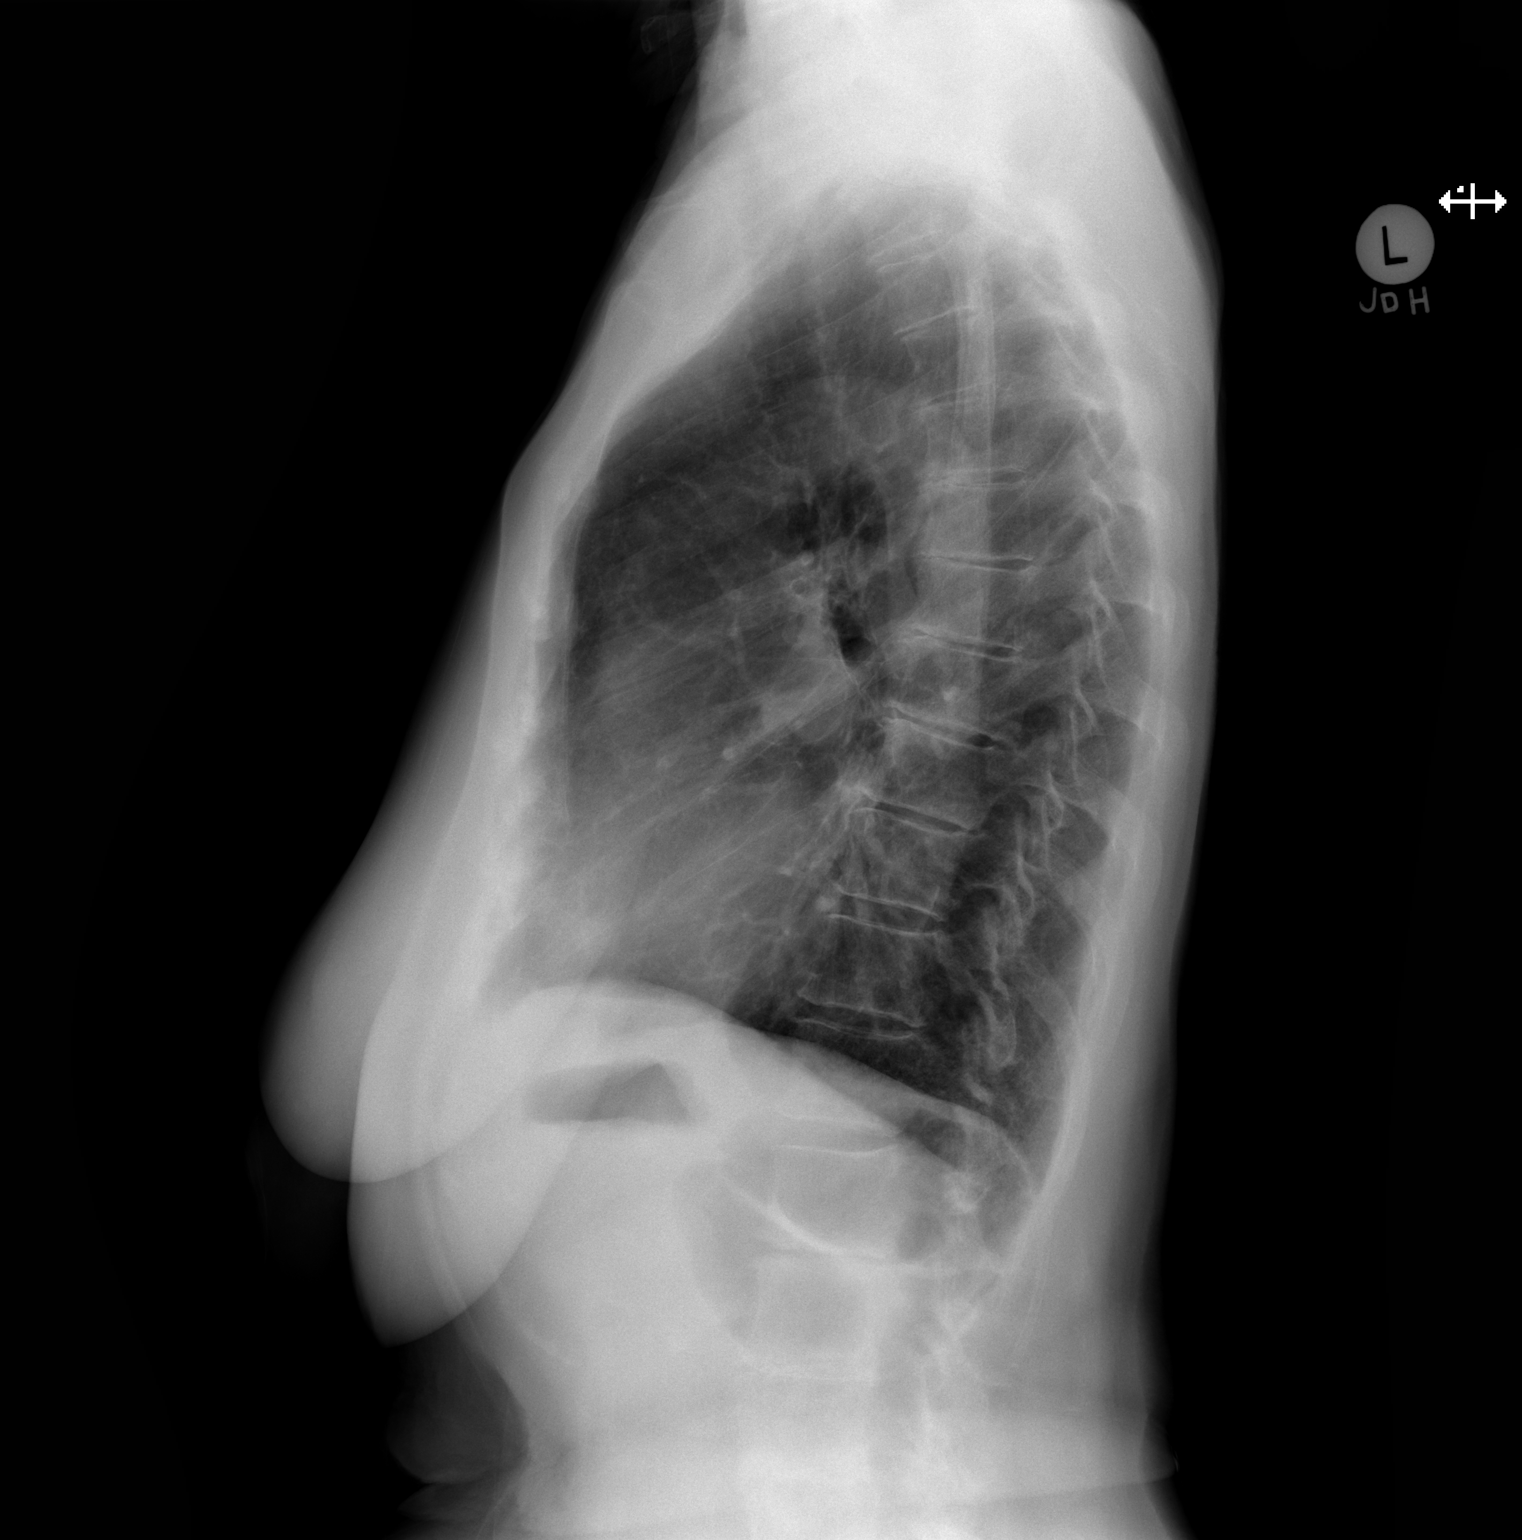

[2 of 2 positions shown; findings below may reference images not displayed]

FINDINGS: Cardiomediastinal silhouette is unremarkable. No acute infiltrate or
pleural effusion. No pulmonary edema. Mild degenerative changes mid
thoracic spine.
IMPRESSION: No active cardiopulmonary disease.

## 2018-04-19 ENCOUNTER — Encounter: Payer: Self-pay | Admitting: Cardiology

## 2018-05-15 NOTE — Progress Notes (Signed)
HPI: FU hypertension.  Echocardiogram August 2017 showed normal LV function, mild diastolic dysfunction. Since last seen the patient denies any dyspnea on exertion, orthopnea, PND, pedal edema, palpitations, syncope or chest pain.   Current Outpatient Medications  Medication Sig Dispense Refill  . acetaminophen (TYLENOL) 325 MG tablet Take 325 mg by mouth every 6 (six) hours as needed. Reported on 09/09/2015    . Chlorphen-Phenyleph-ASA (ALKA-SELTZER PLUS COLD PO) Take 1 tablet by mouth daily.    . cholecalciferol (VITAMIN D) 1000 UNITS tablet Take 1,000 Units by mouth daily.    . diphenhydramine-acetaminophen (TYLENOL PM) 25-500 MG TABS Take 1 tablet by mouth at bedtime as needed. Takes most nights    . levETIRAcetam (KEPPRA) 500 MG tablet TAKE 1 TABLET BY MOUTH TWICE A DAY 60 tablet 10  . lisinopril (PRINIVIL,ZESTRIL) 2.5 MG tablet Take 1 tablet (2.5 mg total) by mouth daily. Please schedule appointment for refills. 60 tablet 0   No current facility-administered medications for this visit.      Past Medical History:  Diagnosis Date  . Eye exam abnormal 11/21/14   Dr.Hecker-glaucoma suspect, cataracts, hemorrage  . GERD (gastroesophageal reflux disease)   . Hx of migraines   . Hyperlipidemia   . Macular degeneration   . Osteoporosis    DEXA 03/2010 T-3.2 R hip; declines meds  . PUD (peptic ulcer disease) 1980  . Restless leg syndrome   . Seizure disorder (HCC) 1997   evaluated by [facility] in past  . Tinnitus 05/2009   DR BATES--related to change in med/generic.  Resolved  . Vitamin D deficiency     Past Surgical History:  Procedure Laterality Date  . CATARACT EXTRACTION Bilateral 11/2014, 01/2015   Dr. Elmer Picker  . TONSILLECTOMY    . TUBAL LIGATION      Social History   Socioeconomic History  . Marital status: Widowed    Spouse name: Not on file  . Number of children: 2  . Years of education: Not on file  . Highest education level: Not on file  Occupational History    . Occupation: retired (from Community education officer)  Social Needs  . Financial resource strain: Not on file  . Food insecurity:    Worry: Not on file    Inability: Not on file  . Transportation needs:    Medical: Not on file    Non-medical: Not on file  Tobacco Use  . Smoking status: Never Smoker  . Smokeless tobacco: Never Used  Substance and Sexual Activity  . Alcohol use: No  . Drug use: No  . Sexual activity: Not Currently  Lifestyle  . Physical activity:    Days per week: Not on file    Minutes per session: Not on file  . Stress: Not on file  Relationships  . Social connections:    Talks on phone: Not on file    Gets together: Not on file    Attends religious service: Not on file    Active member of club or organization: Not on file    Attends meetings of clubs or organizations: Not on file    Relationship status: Not on file  . Intimate partner violence:    Fear of current or ex partner: Not on file    Emotionally abused: Not on file    Physically abused: Not on file    Forced sexual activity: Not on file  Other Topics Concern  . Not on file  Social History Narrative   Lives  alone (in a one-level townhome). Has a bracelet she wears for assistance if she falls. Widowed 2009.  1 daughter in [place], 1 daughter in [place]. 5 grandchildren. Great-grandchild expected in July.    Family History  Problem Relation Age of Onset  . Heart disease Mother   . Dementia Father   . Alzheimer's disease Sister   . Heart disease Brother        2380's  . Thyroid disease Sister        removed, thinks benign  . Diabetes Neg Hx   . Cancer Neg Hx     ROS: no fevers or chills, productive cough, hemoptysis, dysphasia, odynophagia, melena, hematochezia, dysuria, hematuria, rash, seizure activity, orthopnea, PND, pedal edema, claudication. Remaining systems are negative.  Physical Exam: Well-developed well-nourished in no acute distress.  Skin is warm and dry.  HEENT is normal.  Neck is supple.   Chest is clear to auscultation with normal expansion.  Cardiovascular exam is regular rate and rhythm.  2/6 systolic murmur left sternal border. Abdominal exam nontender or distended. No masses palpated. Extremities show no edema. neuro grossly intact  ECG-normal sinus rhythm with first-degree AV block, normal axis, no ST changes.  Personally reviewed  A/P  1 hypertension-patient's blood pressure is controlled.  Continue present medications and follow.  2 murmur-systolic murmur on examination.  Previous echocardiogram did not show significant aortic stenosis.    Olga MillersBrian Cimberly Stoffel, MD

## 2018-05-18 ENCOUNTER — Ambulatory Visit (INDEPENDENT_AMBULATORY_CARE_PROVIDER_SITE_OTHER): Payer: Medicare Other | Admitting: Cardiology

## 2018-05-18 ENCOUNTER — Encounter (INDEPENDENT_AMBULATORY_CARE_PROVIDER_SITE_OTHER): Payer: Self-pay

## 2018-05-18 ENCOUNTER — Encounter: Payer: Self-pay | Admitting: Cardiology

## 2018-05-18 VITALS — BP 120/70 | HR 76 | Ht 61.0 in | Wt 116.0 lb

## 2018-05-18 DIAGNOSIS — I10 Essential (primary) hypertension: Secondary | ICD-10-CM | POA: Diagnosis not present

## 2018-05-18 DIAGNOSIS — R011 Cardiac murmur, unspecified: Secondary | ICD-10-CM

## 2018-05-18 NOTE — Patient Instructions (Signed)
Medication Instructions:  NO CHANGE If you need a refill on your cardiac medications before your next appointment, please call your pharmacy.   Lab work: If you have labs (blood work) drawn today and your tests are completely normal, you will receive your results only by: . MyChart Message (if you have MyChart) OR . A paper copy in the mail If you have any lab test that is abnormal or we need to change your treatment, we will call you to review the results.  Follow-Up: At [facility], you and your health needs are our priority.  As part of our continuing mission to provide you with exceptional heart care, we have created designated Provider Care Teams.  These Care Teams include your primary Cardiologist (physician) and Advanced Practice Providers (APPs -  Physician Assistants and Nurse Practitioners) who all work together to provide you with the care you need, when you need it. You will need a follow up appointment in 12 months.  Please call our office 2 months in advance to schedule this appointment.  You may see BRIAN CRENSHAW MD or one of the following Advanced Practice Providers on your designated Care Team:   Luke Kilroy, PA-C Krista Kroeger, PA-C . Callie Goodrich, PA-C  CALL IN November TO SCHEDULE APPOINTMENT IN January 2021   

## 2018-06-06 ENCOUNTER — Other Ambulatory Visit: Payer: Self-pay | Admitting: Cardiology

## 2018-09-23 ENCOUNTER — Other Ambulatory Visit: Payer: Self-pay | Admitting: Family Medicine

## 2018-09-23 DIAGNOSIS — G40909 Epilepsy, unspecified, not intractable, without status epilepticus: Secondary | ICD-10-CM

## 2018-10-16 ENCOUNTER — Other Ambulatory Visit: Payer: Self-pay | Admitting: Family Medicine

## 2018-10-16 DIAGNOSIS — G40909 Epilepsy, unspecified, not intractable, without status epilepticus: Secondary | ICD-10-CM

## 2018-10-16 NOTE — Telephone Encounter (Signed)
Pt has an appt tomorrow °

## 2018-10-16 NOTE — Telephone Encounter (Signed)
Last refilled on 09/24/2018

## 2018-10-16 NOTE — Progress Notes (Signed)
Chief Complaint  Patient presents with  . Medicare Wellness    fasting AWV(has small amount of OJ) with pelvic. No concerns.     Alicia [name] is a 85 y.o. female who presents for annual physical exam, Medicare wellness visit and follow-up on chronic medical conditions.   She is complaining of some irritation in the vaginal area, related to the pads she wears at night.  She is using OTC cream (corgard??). She declines exam today.  Hypertension and diastolic dysfunction(grade 1 per echo 11/2015).She continues on lisinopril 2.5mg .  She last saw Dr. Jens Somrenshaw in January, and no changes were made. BP was good at 120/70 at that visit. She doesn't monitor her blood pressure (has one at home).  Osteoporosis: She drinks 2-3 glasses of milk daily. She has declined any treatment for osteoporosis in the past, refusing to take any more medications.She continues to have no interest in discussing treatment of osteoporosis. Still takes Vitamin D; used to get weight-bearing exercise at the gym 2x/week, but none since gyms closed (COVID-19).  Seizure disorder: she hasn't had any seizures since being changed to just the Keppra alone (lamictal stopped). Last seizure was probably in 1998. She prefers to only take [employer] generic for Keppra (got ringing in her ears with other meds in the past)  Takes tylenol every morning (usually for headache). Also take alka selzer plus daily for allergies, just one tablet in the morning. She has frequent congestion in her head, and some frontal sinus headaches.   Immunization History  Administered Date(s) Administered  . Influenza Split 03/01/2012  . Influenza, High Dose Seasonal PF 01/19/2014, 01/19/2015, 12/30/2015, 12/28/2016, 01/11/2018  . Influenza-Unspecified 12/24/2012, 12/28/2016  . Pneumococcal Conjugate-13 08/13/2014  . Pneumococcal Polysaccharide-23 04/26/1999, 09/01/2006, 09/14/2016  . Td 09/01/2006  . Tdap 07/25/2011  . Zoster 09/10/2015   Last Pap  smear: 07/2014, normal, with no high risk HPV present Last mammogram: 12/2013(doesn't like the "traffic" to get to [address] for appointment, only reason she hasn't gotten it again). "I don't think I need it" Last colonoscopy: Per Dr. Milinda [facility]Hulsemann's [facility], said 2 years prior (so would have been approx 2006); negative Cologard 08/2015 Last DEXA: 03/2010 (shows osteoporosis, but pt refuses treatment)--I can't see scanned report in Epic, but do see T-2.7 at hip from 2002 DEXA. Ophtho: goes yearly  Dentist: 2x/year. Exercise:nothing since the gyms closed, just walks to get her mail and housework.  [facility], does [employer] classes (includes weights).   Lipids: Lab Results  Component Value Date   CHOL 212 (H) 09/14/2016   HDL 95 09/14/2016   LDLCALC 98 09/14/2016   TRIG 97 09/14/2016   CHOLHDL 2.2 09/14/2016   Vitamin D level 07/2011: 59 (on same supplements)  Other doctors caring for patient include: Cardiologist: Dr. Jens Somrenshaw Dentist: Dr. Michaell CowingGross ophtho--Dr. Frederico HammanKevin Vinning at [facility] for glasses, Dr. Elmer PickerHecker for cataracts Neuro: at [facility] (last seen 07/2013) Dr. Daneen Schickodney Radtke   Depression screen: negative Fall screen: negative Functional status screen: Notable forurinary leakage (wears pads constantly, unchanged).  Mini-cog screen: normal (5) See full screen in Epic.   End of Life Discussion: Patient hasa living will and medical power of attorney   Past Medical History:  Diagnosis Date  . Eye exam abnormal 11/21/14   Dr.Hecker-glaucoma suspect, cataracts, hemorrage  . GERD (gastroesophageal reflux disease)   . Hx of migraines   . Hyperlipidemia   . Macular degeneration   . Osteoporosis  DEXA 03/2010 T-3.2 R hip; declines meds  . PUD (peptic ulcer disease) 1980  . Restless leg syndrome   . Seizure disorder ([place]) 1997   evaluated by [facility] in past  . Tinnitus 05/2009   DR BATES--related to change in med/generic.   Resolved  . Vitamin D deficiency     Past Surgical History:  Procedure Laterality Date  . CATARACT EXTRACTION Bilateral 11/2014, 01/2015   Dr. Herbert Deaner  . TONSILLECTOMY    . TUBAL LIGATION      Social History   Socioeconomic History  . Marital status: Widowed    Spouse name: Not on file  . Number of children: 2  . Years of education: Not on file  . Highest education level: Not on file  Occupational History  . Occupation: retired (from Insurance underwriter)  Social Needs  . Financial resource strain: Not on file  . Food insecurity    Worry: Not on file    Inability: Not on file  . Transportation needs    Medical: Not on file    Non-medical: Not on file  Tobacco Use  . Smoking status: Never Smoker  . Smokeless tobacco: Never Used  Substance and Sexual Activity  . Alcohol use: No  . Drug use: No  . Sexual activity: Not Currently  Lifestyle  . Physical activity    Days per week: Not on file    Minutes per session: Not on file  . Stress: Not on file  Relationships  . Social Herbalist on phone: Not on file    Gets together: Not on file    Attends religious service: Not on file    Active member of club or organization: Not on file    Attends meetings of clubs or organizations: Not on file    Relationship status: Not on file  . Intimate partner violence    Fear of current or ex partner: Not on file    Emotionally abused: Not on file    Physically abused: Not on file    Forced sexual activity: Not on file  Other Topics Concern  . Not on file  Social History Narrative   Lives alone (in a one-level townhome). Has a bracelet she wears for assistance if she falls. Widowed 2009.  1 daughter in [place] (has 3 kids), 1 daughter in [place] (2 children). 5 grandchildren total.  1Great-grandchild (in [place])    Family History  Problem Relation Age of Onset  . Heart disease Mother   . Dementia Father   . Alzheimer's disease Sister   . Heart disease Brother        21's  .  Thyroid disease Sister        removed, thinks benign  . Diabetes Neg Hx   . Cancer Neg Hx     Outpatient Encounter Medications as of 10/17/2018  Medication Sig Note  . acetaminophen (TYLENOL) 325 MG tablet Take 325 mg by mouth every 6 (six) hours as needed. Reported on 09/09/2015 10/17/2018: Takes one daily  . cholecalciferol (VITAMIN D) 1000 UNITS tablet Take 1,000 Units by mouth daily.   . diphenhydramine-acetaminophen (TYLENOL PM) 25-500 MG TABS Take 1 tablet by mouth at bedtime as needed. Takes most nights 09/14/2016: Uses every night  . levETIRAcetam (KEPPRA) 500 MG tablet TAKE 1 TABLET BY MOUTH TWICE A DAY [phone])   . lisinopril (PRINIVIL,ZESTRIL) 2.5 MG tablet TAKE 1 TABLET BY MOUTH EVERY DAY   . [DISCONTINUED] Phenyleph-Doxylamine-DM-APAP (ALKA-SELTZER PLS ALLERGY & CGH PO)  Take by mouth. 10/17/2018: Uses once daily for allergies  . Chlorphen-Phenyleph-ASA (ALKA-SELTZER PLUS COLD PO) Take 1 tablet by mouth daily. 09/14/2016: Uses prn allergies/sinuses   No facility-administered encounter medications on file as of 10/17/2018.     Allergies  Allergen Reactions  . Sulfa Antibiotics Rash    ROS: The patient denies anorexia, fever, weight changes, headaches, seizures (many years ago, compliant with her medications), vision changes, ear pain, sore throat, breast concerns, chest pain, palpitations,syncope, dyspnea on exertion, cough (occcasional, related to allergies/PND), swelling, nausea, vomiting, diarrhea, constipation, abdominal pain, melena, hematochezia, vaginal bleeding, discharge, odor or itch, genital lesions, numbness, tingling, weakness, tremor, suspicious skin lesions, depression, abnormal bleeding/bruising, or enlarged lymph nodes.  Occasional heartburn with spicy food (relieved by pepcid tablet as needed).Rarely eats these foods, so rare symptoms +urinary incontinence (chronic), wears pads. Denies dysuria; hematuria Sinus headaches and allergies--uses alka selzer plus,  no longer uses Flonase. Some leg pain at night, behind her knees. Theragesic helps.    PHYSICAL EXAM:  BP 130/72   Pulse 84   Temp 97.7 F (36.5 C) (Temporal)   Ht 5\' 1"  (1.549 m)   Wt 114 lb 6.4 oz (51.9 kg)   BMI 21.62 kg/m    Wt Readings from Last 3 Encounters:  05/18/18 116 lb (52.6 kg)  10/11/17 115 lb 6.4 oz (52.3 kg)  09/14/16 116 lb (52.6 kg)   General Appearance:  Alert, cooperative, no distress, appears stated ageor slightly younger.   Head:  Normocephalic, without obvious abnormality, atraumatic   Eyes:  PERRL, conjunctiva/corneas clear, EOM's intact, fundi benign   Ears:  Normal TM's and external ear canals   Nose:  Not examined (wearing mask due to COVID-19)  Throat:  Not examined (wearing mask due to COVID-19)  Neck:  Supple, no lymphadenopathy; thyroid: no enlargement/ tenderness/nodules; no carotid bruit or JVD   Back:  Spine nontender, no curvature, ROM normal, no CVA tenderness   Lungs:  Clear to auscultation bilaterally without wheezes, rales or ronchi; respirations unlabored   Chest Wall:  No tenderness or deformity   Heart:  Regular rate and rhythm, S1 and S2 normal, no rub or gallop; 2/6 SEM notedat RUSB  Breast Exam:  No tenderness, masses, or nipple discharge or inversion. No axillary lymphadenopathy   Abdomen:  Soft, non-tender, nondistended, normoactive bowel sounds, no masses, no hepatosplenomegaly   Genitalia:  Pt declined exam  Rectal:  Pt declined exam  Extremities:  No clubbing, cyanosis or edema   Pulses:  2+ and symmetric all extremities   Skin:  Skin color, texture, turgor normal, no rashes or lesions   Lymph nodes:  Cervical, supraclavicular, and axillary nodes normal   Neurologic:  CNII-XII intact, normal strength, sensation and gait; reflexes 2+ and symmetric throughout    Psych: Normal mood, affect, hygiene and  grooming    ASSESSMENT/PLAN:  Medicare annual wellness visit, subsequent   Essential hypertension - controlled - Plan: Comprehensive metabolic panel  Diastolic dysfunction   Osteoporosis without current pathological fracture, unspecified osteoporosis type - refuses treatment, discussed newer options including Prolia, Reclast, others. Counseled re: Ca, D, wt bearing exercise and risks of fx   Seizure disorder (HCC) - compliant with meds with no further seizures - Plan: levETIRAcetam (KEPPRA) 500 MG tablet  Medication monitoring encounter   Screening for colon cancer - Plan: Cologuard  Bruising - Plan: CBC with Differential/Platelet   Risks of untreated osteoporosis reviewed Risks/benefits of mammograms reviewed. She no longer wants mammograms, understands the risk of  finding cancer later (only when palpable), which could have a worse prognosis than if found earlier on mammo. She understands and declines.   Discussed monthly self breast exams and yearly mammograms; at least 30 minutes of aerobic activity at least 5 days/week and weight-bearing exercise 2x/week; proper sunscreen use reviewed; healthy diet, including goals of calcium and vitamin D intake and alcohol recommendations (less than or equal to 1 drink/day) reviewed; regular seatbelt use; changing batteries in smoke detectors. Immunization recommendations discussed--continue yearly flu shots. Shingrix recommended. Risks/side effects/benefits reviewed in detail.To get from pharmacy. Colon cancer screening--Repeat Cologard now due.   F/u 1 year, sooner prn.   Medicare Attestation I have personally reviewed: The patient's medical and social history Their use of alcohol, tobacco or illicit drugs Their current medications and supplements The patient's functional ability including ADLs,fall risks, home safety risks, cognitive, and hearing and visual impairment Diet and physical activities Evidence for depression or  mood disorders  The patient's weight, height, BMI, and visual acuity have been recorded in the chart.  I have made referrals, counseling, and provided education to the patient based on review of the above and I have provided the patient with a written personalized care plan for preventive services.

## 2018-10-16 NOTE — Patient Instructions (Addendum)
HEALTH MAINTENANCE RECOMMENDATIONS:  It is recommended that you get at least 30 minutes of aerobic exercise at least 5 days/week (for weight loss, you may need as much as 60-90 minutes). This can be any activity that gets your heart rate up. This can be divided in 10-15 minute intervals if needed, but try and build up your endurance at least once a week.  Weight bearing exercise is also recommended twice weekly.  Eat a healthy diet with lots of vegetables, fruits and fiber.  "Colorful" foods have a lot of vitamins (ie green vegetables, tomatoes, red peppers, etc).  Limit sweet tea, regular sodas and alcoholic beverages, all of which has a lot of calories and sugar.  Up to 1 alcoholic drink daily may be beneficial for women (unless trying to lose weight, watch sugars).  Drink a lot of water.  Calcium recommendations are 1200-1500 mg daily (1500 mg for postmenopausal women or women without ovaries), and vitamin D 1000 IU daily.  This should be obtained from diet and/or supplements (vitamins), and calcium should not be taken all at once, but in divided doses.  Monthly self breast exams and yearly mammograms for women over the age of 68 is recommended.  Sunscreen of at least SPF 30 should be used on all sun-exposed parts of the skin when outside between the hours of 10 am and 4 pm (not just when at beach or pool, but even with exercise, golf, tennis, and yard work!)  Use a sunscreen that says "broad spectrum" so it covers both UVA and UVB rays, and make sure to reapply every 1-2 hours.  Remember to change the batteries in your smoke detectors when changing your clock times in the spring and fall. Carbon monoxide detector is recommended for your home.  Use your seat belt every time you are in a car, and please drive safely and not be distracted with cell phones and texting while driving.   Alicia [name] , Thank you for taking time to come for your Medicare Wellness Visit. I appreciate your ongoing  commitment to your health goals. Please review the following plan we discussed and let me know if I can assist you in the future.   This is a list of the screening recommended for you and due dates:  Health Maintenance  Topic Date Due  . Flu Shot  11/24/2018  . Tetanus Vaccine  07/24/2021  . DEXA scan (bone density measurement)  Completed  . Pneumonia vaccines  Completed   Continue to get high dose flu shots yearly, in the Fall.  I recommend getting the new shingles vaccine (Shingrix). You will need to get this from a pharmacy, as it is covered by Medicare Part D.  It is a series of 2 injections, spaced 2 months apart.  Yearly mammograms are recommended (please call to schedule).  Desitin is a diaper cream (zinc) that can act as a protective barrier, to protect your skin from the moisture at night.   I recommend using loratidine (Claritin) daily for allergies, instead of Alka Selzer plus.  It is safer, and lasts all day. Consider adding Flonase in the Spring/Fall if the claritin alone isn't controlling your allergy symptoms.  At your convenience, please get [place] copies of your Living Will and Independence.   Treatment for osteoporosis is recommended, in order to prevent fractures.  Please let us know if you change your mind and are willing to reconsider treatment.  Your bone density test back in 2002 showed  osteoporosis in your hip.  If you do not want to take more pills, options for treatment include Prolia injections every 6 months (given in our office), or a once yearly IV infusion (done at infusion center/hospital).  We likely would need to repeat another bone density test in order to get your insurance to cover the medication (and be a baseline, prior to treatment). You could arrange to have the bone density test and mammogram done on the same day, for convenience. Please be sure to get adequate calcium, vitamin D and weight-bearing exercise as we discussed.    Osteoporosis  Osteoporosis is thinning and loss of density in your bones. Osteoporosis makes bones more brittle and fragile and more likely to break (fracture). Over time, osteoporosis can cause your bones to become so weak that they fracture after a minor fall. Bones in the hip, wrist, and spine are most likely to fracture due to osteoporosis. What are the causes? The exact cause of this condition is not known. What increases the risk? You may be at greater risk for osteoporosis if you:  Have a family history of the condition.  Have poor nutrition.  Use steroid medicines, such as prednisone.  Are female.  Are age 83 or older.  Smoke or have a history of smoking.  Are not physically active (are sedentary).  Are white (Caucasian) or of Asian descent.  Have a small body frame.  Take certain medicines, such as antiseizure medicines. What are the signs or symptoms? A fracture might be the first sign of osteoporosis, especially if the fracture results from a fall or injury that usually would not cause a bone to break. Other signs and symptoms include:  Pain in the neck or low back.  Stooped posture.  Loss of height. How is this diagnosed? This condition may be diagnosed based on:  Your medical history.  A physical exam.  A bone mineral density test, also called a DXA or DEXA test (dual-energy X-ray absorptiometry test). This test uses X-rays to measure the amount of minerals in your bones. How is this treated? The goal of treatment is to strengthen your bones and lower your risk for a fracture. Treatment may involve:  Making lifestyle changes, such as: ? Including foods with more calcium and vitamin D in your diet. ? Doing weight-bearing and muscle-strengthening exercises. ? Stopping tobacco use. ? Limiting alcohol intake.  Taking medicine to slow the process of bone loss or to increase bone density.  Taking daily supplements of calcium and vitamin D.  Taking  hormone replacement medicines, such as estrogen for women and testosterone for men.  Monitoring your levels of calcium and vitamin D. Follow these instructions at home:  Activity  Exercise as told by your health care provider. Ask your health care provider what exercises and activities are safe for you. You should do: ? Exercises that make you work against gravity (weight-bearing exercises), such as tai chi, yoga, or walking. ? Exercises to strengthen muscles, such as lifting weights. Lifestyle  Limit alcohol intake to no more than 1 drink a day for nonpregnant women and 2 drinks a day for men. One drink equals 12 oz of beer, 5 oz of wine, or 1 oz of hard liquor.  Do not use any products that contain nicotine or tobacco, such as cigarettes and e-cigarettes. If you need help quitting, ask your health care provider. Preventing falls  Use devices to help you move around (mobility aids) as needed, such as canes, walkers, scooters,  or crutches.  Keep rooms well-lit and clutter-free.  Remove tripping hazards from walkways, including cords and throw rugs.  Install grab bars in bathrooms and safety rails on stairs.  Use rubber mats in the bathroom and other areas that are often wet or slippery.  Wear closed-toe shoes that fit well and support your feet. Wear shoes that have rubber soles or low heels.  Review your medicines with your health care provider. Some medicines can cause dizziness or changes in blood pressure, which can increase your risk of falling. General instructions  Include calcium and vitamin D in your diet. Calcium is important for bone health, and vitamin D helps your body to absorb calcium. Good sources of calcium and vitamin D include: ? Certain fatty fish, such as salmon and tuna. ? Products that have calcium and vitamin D added to them (fortified products), such as fortified cereals. ? Egg yolks. ? Cheese. ? Liver.  Take over-the-counter and prescription medicines  only as told by your health care provider.  Keep all follow-up visits as told by your health care provider. This is important. Contact a health care provider if:  You have never been screened for osteoporosis and you are: ? A woman who is age 57 or older. ? A man who is age 33 or older. Get help right away if:  You fall or injure yourself. Summary  Osteoporosis is thinning and loss of density in your bones. This makes bones more brittle and fragile and more likely to break (fracture),even with minor falls.  The goal of treatment is to strengthen your bones and reduce your risk for a fracture.  Include calcium and vitamin D in your diet. Calcium is important for bone health, and vitamin D helps your body to absorb calcium.  Talk with your health care provider about screening for osteoporosis if you are a woman who is age 64 or older, or a man who is age 83 or older. This information is not intended to replace advice given to you by your health care provider. Make sure you discuss any questions you have with your health care provider. Document Released: 01/19/2005 Document Revised: 02/03/2017 Document Reviewed: 02/03/2017 Elsevier Interactive Patient Education  2019 [employer].

## 2018-10-17 ENCOUNTER — Ambulatory Visit (INDEPENDENT_AMBULATORY_CARE_PROVIDER_SITE_OTHER): Payer: Medicare Other | Admitting: Family Medicine

## 2018-10-17 ENCOUNTER — Encounter: Payer: Self-pay | Admitting: Family Medicine

## 2018-10-17 ENCOUNTER — Other Ambulatory Visit: Payer: Self-pay

## 2018-10-17 VITALS — BP 130/72 | HR 84 | Temp 97.7°F | Ht 61.0 in | Wt 114.4 lb

## 2018-10-17 DIAGNOSIS — Z1211 Encounter for screening for malignant neoplasm of colon: Secondary | ICD-10-CM | POA: Diagnosis not present

## 2018-10-17 DIAGNOSIS — M810 Age-related osteoporosis without current pathological fracture: Secondary | ICD-10-CM

## 2018-10-17 DIAGNOSIS — I5189 Other ill-defined heart diseases: Secondary | ICD-10-CM

## 2018-10-17 DIAGNOSIS — I10 Essential (primary) hypertension: Secondary | ICD-10-CM | POA: Diagnosis not present

## 2018-10-17 DIAGNOSIS — Z0000 Encounter for general adult medical examination without abnormal findings: Secondary | ICD-10-CM

## 2018-10-17 DIAGNOSIS — Z5181 Encounter for therapeutic drug level monitoring: Secondary | ICD-10-CM

## 2018-10-17 DIAGNOSIS — T148XXA Other injury of unspecified body region, initial encounter: Secondary | ICD-10-CM

## 2018-10-17 DIAGNOSIS — G40909 Epilepsy, unspecified, not intractable, without status epilepticus: Secondary | ICD-10-CM | POA: Diagnosis not present

## 2018-10-17 MED ORDER — LEVETIRACETAM 500 MG PO TABS: ORAL_TABLET | ORAL | 3 refills | Status: DC

## 2018-10-18 ENCOUNTER — Other Ambulatory Visit: Payer: Self-pay | Admitting: *Deleted

## 2018-10-18 DIAGNOSIS — E875 Hyperkalemia: Secondary | ICD-10-CM

## 2018-10-18 DIAGNOSIS — R7309 Other abnormal glucose: Secondary | ICD-10-CM

## 2018-10-18 LAB — COMPREHENSIVE METABOLIC PANEL
ALT: 12 IU/L (ref 0–32)
AST: 21 IU/L (ref 0–40)
Albumin/Globulin Ratio: 1.9 (ref 1.2–2.2)
Albumin: 4.7 g/dL — ABNORMAL HIGH (ref 3.6–4.6)
Alkaline Phosphatase: 34 IU/L — ABNORMAL LOW (ref 39–117)
BUN/Creatinine Ratio: 17 (ref 12–28)
BUN: 12 mg/dL (ref 8–27)
Bilirubin Total: 0.3 mg/dL (ref 0.0–1.2)
CO2: 25 mmol/L (ref 20–29)
Calcium: 9.6 mg/dL (ref 8.7–10.3)
Chloride: 93 mmol/L — ABNORMAL LOW (ref 96–106)
Creatinine, Ser: 0.7 mg/dL (ref 0.57–1.00)
GFR calc Af Amer: 93 mL/min/{1.73_m2} (ref >59)
GFR calc non Af Amer: 80 mL/min/{1.73_m2} (ref >59)
Globulin, Total: 2.5 g/dL (ref 1.5–4.5)
Glucose: 104 mg/dL — ABNORMAL HIGH (ref 65–99)
Potassium: 5.3 mmol/L — ABNORMAL HIGH (ref 3.5–5.2)
Sodium: 132 mmol/L — ABNORMAL LOW (ref 134–144)
Total Protein: 7.2 g/dL (ref 6.0–8.5)

## 2018-10-18 LAB — CBC WITH DIFFERENTIAL/PLATELET
Basophils Absolute: 0.1 10*3/uL (ref 0.0–0.2)
Basos: 1 %
EOS (ABSOLUTE): 0.1 10*3/uL (ref 0.0–0.4)
Eos: 1 %
Hematocrit: 40.7 % (ref 34.0–46.6)
Hemoglobin: 13.2 g/dL (ref 11.1–15.9)
Immature Grans (Abs): 0 10*3/uL (ref 0.0–0.1)
Immature Granulocytes: 1 %
Lymphocytes Absolute: 1.3 10*3/uL (ref 0.7–3.1)
Lymphs: 21 %
MCH: 28.4 pg (ref 26.6–33.0)
MCHC: 32.4 g/dL (ref 31.5–35.7)
MCV: 88 fL (ref 79–97)
Monocytes Absolute: 0.5 10*3/uL (ref 0.1–0.9)
Monocytes: 7 %
Neutrophils Absolute: 4.5 10*3/uL (ref 1.4–7.0)
Neutrophils: 69 %
Platelets: 230 10*3/uL (ref 150–450)
RBC: 4.65 x10E6/uL (ref 3.77–5.28)
RDW: 14.1 % (ref 11.7–15.4)
WBC: 6.4 10*3/uL (ref 3.4–10.8)

## 2018-11-19 ENCOUNTER — Other Ambulatory Visit: Payer: Self-pay

## 2018-11-19 ENCOUNTER — Other Ambulatory Visit: Payer: Medicare Other

## 2018-11-19 DIAGNOSIS — R7309 Other abnormal glucose: Secondary | ICD-10-CM | POA: Diagnosis not present

## 2018-11-19 DIAGNOSIS — E875 Hyperkalemia: Secondary | ICD-10-CM | POA: Diagnosis not present

## 2018-11-20 LAB — BASIC METABOLIC PANEL
BUN/Creatinine Ratio: 14 (ref 12–28)
BUN: 12 mg/dL (ref 8–27)
CO2: 24 mmol/L (ref 20–29)
Calcium: 9.3 mg/dL (ref 8.7–10.3)
Chloride: 96 mmol/L (ref 96–106)
Creatinine, Ser: 0.84 mg/dL (ref 0.57–1.00)
GFR calc Af Amer: 74 mL/min/{1.73_m2} (ref >59)
GFR calc non Af Amer: 64 mL/min/{1.73_m2} (ref >59)
Glucose: 96 mg/dL (ref 65–99)
Potassium: 5 mmol/L (ref 3.5–5.2)
Sodium: 135 mmol/L (ref 134–144)

## 2018-12-28 DIAGNOSIS — Z23 Encounter for immunization: Secondary | ICD-10-CM | POA: Diagnosis not present

## 2019-01-31 ENCOUNTER — Telehealth: Payer: Self-pay | Admitting: *Deleted

## 2019-01-31 DIAGNOSIS — R413 Other amnesia: Secondary | ICD-10-CM

## 2019-01-31 DIAGNOSIS — G40909 Epilepsy, unspecified, not intractable, without status epilepticus: Secondary | ICD-10-CM

## 2019-01-31 NOTE — Telephone Encounter (Signed)
Daughter advised.

## 2019-01-31 NOTE — Telephone Encounter (Signed)
Patient daughter Alicia [name] and her sister Alicia [name] called on behalf of patient. They are very concerned about their mother's memory. She has become very forgetful and repeats herself all the time. Alicia [name] sees Dr. Tish Frederickson @ [facility] and would like to know if you would be able to refer her mother there for an evaluation or what the proper steps would be?

## 2019-01-31 NOTE — Telephone Encounter (Signed)
Advise that I put in referral to see Dr. Leta Baptist.  If the wait time is a while for an appointment, we can have her come here for some initial evaluation.  This wasn't brought up at her last wellness visit, had normal mini-cog screen, so didn't do full mini-mental status exam

## 2019-03-13 ENCOUNTER — Other Ambulatory Visit: Payer: Self-pay

## 2019-03-13 ENCOUNTER — Ambulatory Visit (INDEPENDENT_AMBULATORY_CARE_PROVIDER_SITE_OTHER): Payer: Medicare Other | Admitting: Diagnostic Neuroimaging

## 2019-03-13 ENCOUNTER — Encounter: Payer: Self-pay | Admitting: Diagnostic Neuroimaging

## 2019-03-13 VITALS — BP 156/82 | HR 96 | Temp 97.2°F | Ht 61.0 in | Wt 111.8 lb

## 2019-03-13 DIAGNOSIS — R413 Other amnesia: Secondary | ICD-10-CM

## 2019-03-13 NOTE — Patient Instructions (Signed)
  MEMORY LOSS - check MRI brain - consider memantine 10mg  at bedtime; increase to twice a day after 1-2 weeks - safety / supervision issues reviewed - caregiver resources provided - caution with driving and finances

## 2019-03-13 NOTE — Progress Notes (Signed)
[facility]  PATIENT: Alicia [name] DOB: 02/11/1934  REFERRING CLINICIAN: Benedetto CoonsE Knapp HISTORY FROM: patient    REASON FOR VISIT: new consult   HISTORICAL  CHIEF COMPLAINT:  Chief Complaint  Patient presents with  . Memory Loss    rm 7, New Pt, dgtr- Rosey Batheresa, "no seizures in a long time, MMSE 20"  . Seizures    HISTORY OF PRESENT ILLNESS:   85 year old female here for evaluation memory loss.  Patient has noticed some mild memory loss but does not think this is significant.  However patient's daughter is have noted 1 to 2 years of gradual onset progressive short-term memory problems, repeating herself, asking same question over and over, having confusion especially when she is out of her routine.  They are concerned about possibility of dementia.  She is able to drive short distances to the store.  She shops and takes care of her household.  He has someone that helps clean at her home.  She is able to take care of her own personal hygiene issues.  Patient has history of seizure disorder since 1997.  She has been on lamotrigine and levetiracetam in the past.  Last seizure was in 2003.  Currently she is on levetiracetam 250 mg 4 times a day, and she takes this dose because she feels this reduces her side effects.  There is family history of dementia in patient's father.  REVIEW OF SYSTEMS: Full 14 system review of systems performed and negative with exception of: As per HPI.  ALLERGIES: Allergies  Allergen Reactions  . Sulfa Antibiotics Rash    HOME MEDICATIONS: Outpatient Medications Prior to Visit  Medication Sig Dispense Refill  . acetaminophen (TYLENOL) 325 MG tablet Take 325 mg by mouth every 6 (six) hours as needed. Reported on 09/09/2015    . Chlorphen-Phenyleph-ASA (ALKA-SELTZER PLUS COLD PO) Take 1 tablet by mouth daily.    . cholecalciferol (VITAMIN D) 1000 UNITS tablet Take 1,000 Units by mouth daily.    . diphenhydramine-acetaminophen (TYLENOL PM)  25-500 MG TABS Take 1 tablet by mouth at bedtime as needed. Takes most nights    . levETIRAcetam (KEPPRA) 500 MG tablet TAKE 1 TABLET BY MOUTH TWICE A DAY ([id number]) 180 tablet 3  . lisinopril (PRINIVIL,ZESTRIL) 2.5 MG tablet TAKE 1 TABLET BY MOUTH EVERY DAY 90 tablet 3   No facility-administered medications prior to visit.     PAST MEDICAL HISTORY: Past Medical History:  Diagnosis Date  . Eye exam abnormal 11/21/14   Dr.Hecker-glaucoma suspect, cataracts, hemorrage  . GERD (gastroesophageal reflux disease)   . Hx of migraines   . Hyperlipidemia   . Macular degeneration   . Osteoporosis    DEXA 03/2010 T-3.2 R hip; declines meds  . PUD (peptic ulcer disease) 1980  . Restless leg syndrome   . Seizure disorder (HCC) 1997   evaluated by [facility] in past  . Tinnitus 05/2009   DR BATES--related to change in med/generic.  Resolved  . Vitamin D deficiency     PAST SURGICAL HISTORY: Past Surgical History:  Procedure Laterality Date  . CATARACT EXTRACTION Bilateral 11/2014, 01/2015   Dr. Elmer PickerHecker  . TONSILLECTOMY    . TUBAL LIGATION      FAMILY HISTORY: Family History  Problem Relation Age of Onset  . Heart disease Mother   . Dementia Father   . Alzheimer's disease Sister   . Heart disease Brother        2880's  . Thyroid disease Sister  removed, thinks benign  . Diabetes Neg Hx   . Cancer Neg Hx     SOCIAL HISTORY: Social History   Socioeconomic History  . Marital status: Widowed    Spouse name: Not on file  . Number of children: 2  . Years of education: Not on file  . Highest education level: High school graduate  Occupational History  . Occupation: retired (from Insurance underwriter)  Social Needs  . Financial resource strain: Not on file  . Food insecurity    Worry: Not on file    Inability: Not on file  . Transportation needs    Medical: Not on file    Non-medical: Not on file  Tobacco Use  . Smoking status: Never Smoker  . Smokeless tobacco: Never Used   Substance and Sexual Activity  . Alcohol use: No  . Drug use: No  . Sexual activity: Not Currently  Lifestyle  . Physical activity    Days per week: Not on file    Minutes per session: Not on file  . Stress: Not on file  Relationships  . Social Herbalist on phone: Not on file    Gets together: Not on file    Attends religious service: Not on file    Active member of club or organization: Not on file    Attends meetings of clubs or organizations: Not on file    Relationship status: Not on file  . Intimate partner violence    Fear of current or ex partner: Not on file    Emotionally abused: Not on file    Physically abused: Not on file    Forced sexual activity: Not on file  Other Topics Concern  . Not on file  Social History Narrative   03/13/19 Lives alone (in a one-level townhome). Has a bracelet she wears for assistance if she falls. Widowed 2009.  1 daughter in [place] (has 3 kids), 1 daughter in [place] (2 children). 5 grandchildren total.  1Great-grandchild (in [place])   Caffeine- coffee, 1 daily     PHYSICAL EXAM  GENERAL EXAM/CONSTITUTIONAL: Vitals:  Vitals:   03/13/19 0816 03/13/19 0833  BP: (!) 173/99 (!) 156/82  Pulse: 95 96  Temp: (!) 97.2 F (36.2 C)   Weight: 111 lb 12.8 oz (50.7 kg)   Height: 5\' 1"  (1.549 m)      Body mass index is 21.12 kg/m. Wt Readings from Last 3 Encounters:  03/13/19 111 lb 12.8 oz (50.7 kg)  10/17/18 114 lb 6.4 oz (51.9 kg)  05/18/18 116 lb (52.6 kg)     Patient is in no distress; well developed, nourished and groomed; neck is supple  CARDIOVASCULAR:  Examination of carotid arteries is normal; no carotid bruits  Regular rate and rhythm, no murmurs  Examination of peripheral vascular system by observation and palpation is normal  EYES:  Ophthalmoscopic exam of optic discs and posterior segments is normal; no papilledema or hemorrhages  No exam data present  MUSCULOSKELETAL:  Gait, strength, tone,  movements noted in Neurologic exam below  NEUROLOGIC: MENTAL STATUS:  MMSE - [place] Exam 03/13/2019  Orientation to time 2  Orientation to Place 4  Registration 3  Attention/ Calculation 1  Recall 3  Language- name 2 objects 2  Language- repeat 0  Language- follow 3 step command 3  Language- read & follow direction 1  Write a sentence 1  Copy design 0  Total score 20    awake, alert, oriented to  person  [facility] memory   DECR attention and concentration  language fluent, comprehension intact, naming intact  fund of knowledge appropriate  CRANIAL NERVE:   2nd - no papilledema on fundoscopic exam  2nd, 3rd, 4th, 6th - pupils equal and reactive to light, visual fields full to confrontation, extraocular muscles intact, no nystagmus  5th - facial sensation symmetric  7th - facial strength symmetric  8th - hearing intact  9th - palate elevates symmetrically, uvula midline  11th - shoulder shrug symmetric  12th - tongue protrusion midline  MOTOR:   normal bulk and tone, full strength in the BUE, BLE  SENSORY:   normal and symmetric to light touch, temperature, vibration  COORDINATION:   finger-nose-finger, fine finger movements normal  REFLEXES:   deep tendon reflexes present and symmetric  GAIT/STATION:   narrow based gait; SLOW AND CAUTIOUS     DIAGNOSTIC DATA (LABS, IMAGING, TESTING) - I reviewed patient records, labs, notes, testing and imaging myself where available.  Lab Results  Component Value Date   WBC 6.4 10/17/2018   HGB 13.2 10/17/2018   HCT 40.7 10/17/2018   MCV 88 10/17/2018   PLT 230 10/17/2018      Component Value Date/Time   NA 135 11/19/2018 0915   K 5.0 11/19/2018 0915   CL 96 11/19/2018 0915   CO2 24 11/19/2018 0915   GLUCOSE 96 11/19/2018 0915   GLUCOSE 97 09/14/2016 0909   BUN 12 11/19/2018 0915   CREATININE 0.84 11/19/2018 0915   CREATININE 0.81 09/14/2016 0909   CALCIUM 9.3 11/19/2018 0915   PROT 7.2  10/17/2018 1124   ALBUMIN 4.7 (H) 10/17/2018 1124   AST 21 10/17/2018 1124   ALT 12 10/17/2018 1124   ALKPHOS 34 (L) 10/17/2018 1124   BILITOT 0.3 10/17/2018 1124   GFRNONAA 64 11/19/2018 0915   GFRNONAA 62 11/19/2015 0905   GFRAA 74 11/19/2018 0915   GFRAA 72 11/19/2015 0905   Lab Results  Component Value Date   CHOL 212 (H) 09/14/2016   HDL 95 09/14/2016   LDLCALC 98 09/14/2016   TRIG 97 09/14/2016   CHOLHDL 2.2 09/14/2016   No results found for: HGBA1C No results found for: VITAMINB12 Lab Results  Component Value Date   TSH 1.150 10/11/2017     ASSESSMENT AND PLAN  85 y.o. year old female here with:  Dx:  1. Memory loss     PLAN:  MEMORY LOSS (mild dementia vs MCI) - check MRI brain - consider memantine 10mg  at bedtime; increase to twice a day after 1-2 weeks; concern about patient's ability to take her own medications; recommed to increase supervision of medications before starting new medication - safety / supervision issues reviewed - caregiver resources provided - caution with driving and finances; recommend family to discuss transition away from driving and living alone  SEIZURE DISORDER - continue levetiracetam 250mg  4 x per day (stable dosing since 2015; last seizure in 2003)  Orders Placed This Encounter  Procedures  . MR BRAIN W WO CONTRAST   Return in about 6 months (around 09/10/2019).    2004, MD 03/13/2019, 8:52 AM Certified in Neurology, Neurophysiology and Neuroimaging  [facility] [address] [place], [address] [place] [id number]

## 2019-03-20 ENCOUNTER — Other Ambulatory Visit: Payer: Self-pay

## 2019-05-14 ENCOUNTER — Telehealth: Payer: Self-pay | Admitting: *Deleted

## 2019-05-14 NOTE — Telephone Encounter (Signed)
A message was left, re: her follow up visit. 

## 2019-05-15 ENCOUNTER — Other Ambulatory Visit: Payer: Self-pay | Admitting: Cardiology

## 2019-07-18 ENCOUNTER — Encounter: Payer: Self-pay | Admitting: *Deleted

## 2019-07-26 ENCOUNTER — Telehealth: Payer: Self-pay | Admitting: Cardiology

## 2019-07-26 NOTE — Telephone Encounter (Signed)
Left message for patient to call and schedule 1 yr. Follow up with Dr. Jens Som.

## 2019-08-05 ENCOUNTER — Telehealth: Payer: Self-pay | Admitting: Family Medicine

## 2019-08-05 NOTE — Telephone Encounter (Signed)
Virtual visit. Forward to V if she needs to contact daughter for more history if needed

## 2019-08-05 NOTE — Telephone Encounter (Signed)
Pts daughter Alicia [name] called and wants to schedule an appt for pt. She said she has had a bad wet cough for about 4 months now and will only take cough drops. Pts daughter advised that this may need to be a virtual appt and is fine with that but wants to know if someone could call her before the appt so she could explain the symptoms if needed. Let me know if you prefer in office or Virtual? Alicia [name] can be reached at [phone]

## 2019-08-05 NOTE — Telephone Encounter (Signed)
Pts daughter will call back and schedule virtual appt

## 2019-08-06 NOTE — Progress Notes (Signed)
Start time: 10:51 End time: 11:10  Virtual Visit via Video Note  I connected with Alicia [name] on 08/07/2019 by a video enabled telemedicine application and verified that I am speaking with the correct person using two identifiers.   Location: Patient: home, with her daughter Alicia [name] (present for entire visit, and assisting with history) Provider: office   I discussed the limitations of evaluation and management by telemedicine and the availability of in person appointments. The patient expressed understanding and agreed to proceed.  History of Present Illness:  Chief Complaint  Patient presents with  . Cough    VIRTUAL wet cough for a few months. Eating lots of Halls cough drops. No fever.     Patient's daughter Alicia [name] called earlier this week, reporting that patient has had a bad wet cough for about 4 months now, and will only take cough drops. She was staying with patient from out of town, so was much more aware of the cough than others. Daughter Alicia [name] today states that cough sounds "wet". Patient reports that the cough is worse at night.  She reports using Hall's cough drops frequently throughout the day to help with cough.  Hasn't tried any other cough medications.  Denies sniffling, sneezing, runny nose. While the cough sounds wet, she denies ever being able to expectorate any phlegm.  She uses alka selzer cold tablet once a day in the morning, and takes tylenol PM at bedtime (to help her sleep).  She is very concerned about medications affecting her sleep.  She denies indigestion or hearburn.   PMH, PSH, SH reviewed  Outpatient Encounter Medications as of 08/07/2019  Medication Sig Note  . cholecalciferol (VITAMIN D) 1000 UNITS tablet Take 1,000 Units by mouth daily.   . diphenhydramine-acetaminophen (TYLENOL PM) 25-500 MG TABS Take 1 tablet by mouth at bedtime as needed. Takes most nights 09/14/2016: Uses every night  . levETIRAcetam (KEPPRA) 500 MG tablet TAKE 1 TABLET  BY MOUTH TWICE A DAY [phone])   . lisinopril (ZESTRIL) 2.5 MG tablet Take 1 tablet (2.5 mg total) by mouth daily. Please schedule annual appt with Dr. Jens Som for refills. [phone]. 1st attempt.   Marland Kitchen acetaminophen (TYLENOL) 325 MG tablet Take 325 mg by mouth every 6 (six) hours as needed. Reported on 09/09/2015 10/17/2018: Takes one daily  . Chlorphen-Phenyleph-ASA (ALKA-SELTZER PLUS COLD PO) Take 1 tablet by mouth daily. 09/14/2016: Uses prn allergies/sinuses  . [DISCONTINUED] lisinopril (ZESTRIL) 2.5 MG tablet TAKE 1 TABLET BY MOUTH EVERY DAY    No facility-administered encounter medications on file as of 08/07/2019.   Allergies  Allergen Reactions  . Sulfa Antibiotics Rash   ROS: no fever, chills, lethargy/fatigue. Denies any shortness of breath. No pain with breathing. No headaches or dizziness, no sinus pain. No heartburn, nausea, vomiting, diarrhea. No bleeding, bruising, rash.  No other concerns per patient or daughter.  Observations/Objective:  Ht 5\' 1"  (1.549 m)   Wt 108 lb (49 kg)   BMI 20.41 kg/m  T 98.2  Pleasant, well-appearing elderly female in no distress She is alert, oriented, with grossly normal cranial nerves She is speaking comfortably, in no distress. No coughing noted during visit. No sniffling or throat clearing noted.   Assessment and Plan:  Cough - "wet" sounding. Ddx reviewed, suspect PND/allergies given duration, not worsening, and no reflux sx. Trial Claritin and Mucinex DM   Stop taking the Alka Selzer Cold plus medication that you have at home. Instead, take the following:  Take claritin OR allegra once  daily in the morning.  Do not get the D version. Take the Mucinex DM 12 hour form, twice daily. You may still take your tylenol PM at bedtime.  If your cough persists or worsens, let us know, as you will need further evaluation. If you develop fever, pain with breathing, shortness of breath, discolored mucus/phlegm, or bloody phlegm,  please seek immediate evaluation.    Your chart was reviewed, and it appears that you are due to follow-up with Dr. Stanford Breed.  Please contact his office to set up a follow-up appointment with him.   Follow Up Instructions:  PRINT/MAIL AVS to patient.  Daughter wrote down notes of recommendations today, and all questions answered.  I discussed the assessment and treatment plan with the patient. The patient was provided an opportunity to ask questions and all were answered. The patient agreed with the plan and demonstrated an understanding of the instructions.   The patient was advised to call back or seek an in-person evaluation if the symptoms worsen or if the condition fails to improve as anticipated.  I provided 19 minutes of video face-to-face time during this encounter, plus additional time spent in documentation.   Vikki Ports, MD

## 2019-08-07 ENCOUNTER — Ambulatory Visit (INDEPENDENT_AMBULATORY_CARE_PROVIDER_SITE_OTHER): Payer: Medicare Other | Admitting: Family Medicine

## 2019-08-07 ENCOUNTER — Other Ambulatory Visit: Payer: Self-pay | Admitting: Cardiology

## 2019-08-07 ENCOUNTER — Other Ambulatory Visit: Payer: Self-pay

## 2019-08-07 ENCOUNTER — Encounter: Payer: Self-pay | Admitting: Family Medicine

## 2019-08-07 VITALS — Temp 98.2°F | Ht 61.0 in | Wt 108.0 lb

## 2019-08-07 DIAGNOSIS — R05 Cough: Secondary | ICD-10-CM

## 2019-08-07 DIAGNOSIS — R059 Cough, unspecified: Secondary | ICD-10-CM

## 2019-08-07 NOTE — Patient Instructions (Signed)
Stop taking the Alka Selzer Cold plus medication that you have at home. Instead, take the following:  Take claritin OR allegra once daily in the morning.  Do not get the D version. Take the Mucinex DM 12 hour form, twice daily. You may still take your tylenol PM at bedtime.  If your cough persists or worsens, let us know, as you will need further evaluation. If you develop fever, pain with breathing, shortness of breath, discolored mucus/phlegm, or bloody phlegm, please seek immediate evaluation.    Your chart was reviewed, and it appears that you are due to follow-up with Dr. Jens Som.  Please contact his office to set up a follow-up appointment with him.

## 2019-08-08 NOTE — Progress Notes (Signed)
done

## 2019-08-12 ENCOUNTER — Other Ambulatory Visit: Payer: Self-pay | Admitting: Family Medicine

## 2019-08-12 DIAGNOSIS — G40909 Epilepsy, unspecified, not intractable, without status epilepticus: Secondary | ICD-10-CM

## 2019-08-30 ENCOUNTER — Other Ambulatory Visit: Payer: Self-pay | Admitting: Cardiology

## 2019-09-11 ENCOUNTER — Other Ambulatory Visit: Payer: Self-pay

## 2019-09-11 ENCOUNTER — Encounter: Payer: Self-pay | Admitting: Family Medicine

## 2019-09-11 ENCOUNTER — Ambulatory Visit (INDEPENDENT_AMBULATORY_CARE_PROVIDER_SITE_OTHER): Payer: Medicare Other | Admitting: Family Medicine

## 2019-09-11 VITALS — BP 151/76 | HR 70 | Ht 63.0 in | Wt 108.0 lb

## 2019-09-11 DIAGNOSIS — R413 Other amnesia: Secondary | ICD-10-CM

## 2019-09-11 MED ORDER — MEMANTINE HCL 5 MG PO TABS: 5.0000 mg | ORAL_TABLET | Freq: Two times a day (BID) | ORAL | 5 refills | Status: DC

## 2019-09-11 NOTE — Progress Notes (Signed)
PATIENT: Alicia [name] DOB: 01-10-1934  REASON FOR VISIT: follow up HISTORY FROM: patient  Chief Complaint  Patient presents with  . Follow-up    memory, rm 16, with daughter      HISTORY OF PRESENT ILLNESS: Today 09/11/19 Alicia [name] is a 86 y.o. female here today for follow up for memory loss and seizures. She continues levetiracetam 250mg  four times daily. No recent seizures. PCP managing AED.   Memory seems a little worse. Her daughter, , presents with her today and assists with history. Rosey Bath, her youngest daughter, is with Jasmine December via facebook. She did get MRI and does not wish to do so. She was not interested in taking any new medications when previously seen. She has recently moved into an independent living facility. She continues to manage her medication without difficulty. She is using a pill container. She continues to perform ADL's independently. She does seem more forgetful and repetitive in questioning. She is no longer driving. She did have one fall about 6 weeks ago after tripping over a curb. Her daughter feels that she is usually very stable and id not concerned about falls.    HISTORY: (copied from Dr [place] note on 03/13/2019)  86 year old female here for evaluation memory loss.  Patient has noticed some mild memory loss but does not think this is significant.  However patient's daughter is have noted 1 to 2 years of gradual onset progressive short-term memory problems, repeating herself, asking same question over and over, having confusion especially when she is out of her routine.  They are concerned about possibility of dementia.  She is able to drive short distances to the store.  She shops and takes care of her household.  He has someone that helps clean at her home.  She is able to take care of her own personal hygiene issues.  Patient has history of seizure disorder since 1997.  She has been on lamotrigine and levetiracetam in the past.  Last  seizure was in 2003.  Currently she is on levetiracetam 250 mg 4 times a day, and she takes this dose because she feels this reduces her side effects.  There is family history of dementia in patient's father.    REVIEW OF SYSTEMS: Out of a complete 14 system review of symptoms, the patient complains only of the following symptoms, memory loss and all other reviewed systems are negative.   ALLERGIES: Allergies  Allergen Reactions  . Sulfa Antibiotics Rash    HOME MEDICATIONS: Outpatient Medications Prior to Visit  Medication Sig Dispense Refill  . acetaminophen (TYLENOL) 325 MG tablet Take 325 mg by mouth every 6 (six) hours as needed. Reported on 09/09/2015    . Chlorphen-Phenyleph-ASA (ALKA-SELTZER PLUS COLD PO) Take 1 tablet by mouth daily.    . cholecalciferol (VITAMIN D) 1000 UNITS tablet Take 1,000 Units by mouth daily.    . diphenhydramine-acetaminophen (TYLENOL PM) 25-500 MG TABS Take 1 tablet by mouth at bedtime as needed. Takes most nights    . levETIRAcetam (KEPPRA) 500 MG tablet TAKE 1 TABLET BY MOUTH TWICE A DAY (NDC [phone]) 180 tablet 0  . lisinopril (ZESTRIL) 2.5 MG tablet Take 1 tablet (2.5 mg total) by mouth daily. 60 tablet 0   No facility-administered medications prior to visit.    PAST MEDICAL HISTORY: Past Medical History:  Diagnosis Date  . Eye exam abnormal 11/21/14   Dr.Hecker-glaucoma suspect, cataracts, hemorrage  . GERD (gastroesophageal reflux disease)   . Hx of migraines   .  Hyperlipidemia   . Macular degeneration   . Osteoporosis    DEXA 03/2010 T-3.2 R hip; declines meds  . PUD (peptic ulcer disease) 1980  . Restless leg syndrome   . Seizure disorder ([facility]) 1997   evaluated by [facility] in past  . Tinnitus 05/2009   DR BATES--related to change in med/generic.  Resolved  . Vitamin D deficiency     PAST SURGICAL HISTORY: Past Surgical History:  Procedure Laterality Date  . CATARACT EXTRACTION Bilateral 11/2014, 01/2015   Dr. Herbert Deaner  .  TONSILLECTOMY    . TUBAL LIGATION      FAMILY HISTORY: Family History  Problem Relation Age of Onset  . Heart disease Mother   . Dementia Father   . Alzheimer's disease Sister   . Heart disease Brother        82's  . Thyroid disease Sister        removed, thinks benign  . Diabetes Neg Hx   . Cancer Neg Hx     SOCIAL HISTORY: Social History   Socioeconomic History  . Marital status: Widowed    Spouse name: Not on file  . Number of children: 2  . Years of education: Not on file  . Highest education level: High school graduate  Occupational History  . Occupation: retired (from Insurance underwriter)  Tobacco Use  . Smoking status: Never Smoker  . Smokeless tobacco: Never Used  Substance and Sexual Activity  . Alcohol use: No  . Drug use: No  . Sexual activity: Not Currently  Other Topics Concern  . Not on file  Social History Narrative   03/13/19 Lives alone (in a one-level townhome). Has a bracelet she wears for assistance if she falls. Widowed 2009.  1 daughter in [place] (has 3 kids), 1 daughter in [place] (2 children). 5 grandchildren total.  1Great-grandchild (in [place])   Caffeine- coffee, 1 daily   Social Determinants of Health   Financial Resource Strain:   . Difficulty of Paying Living Expenses:   Food Insecurity:   . Worried About Charity fundraiser in the Last Year:   . Arboriculturist in the Last Year:   Transportation Needs:   . Film/video editor (Medical):   Marland Kitchen Lack of Transportation (Non-Medical):   Physical Activity:   . Days of Exercise per Week:   . Minutes of Exercise per Session:   Stress:   . Feeling of Stress :   Social Connections:   . Frequency of Communication with Friends and Family:   . Frequency of Social Gatherings with Friends and Family:   . Attends Religious Services:   . Active Member of Clubs or Organizations:   . Attends Archivist Meetings:   Marland Kitchen Marital Status:   Intimate Partner Violence:   . Fear of Current or Ex-Partner:    . Emotionally Abused:   Marland Kitchen Physically Abused:   . Sexually Abused:       PHYSICAL EXAM  Vitals:   09/11/19 0922  BP: (!) 151/76  Pulse: 70  Weight: 108 lb (49 kg)  Height: 5\' 3"  (1.6 m)   Body mass index is 19.13 kg/m.  Generalized: Well developed, in no acute distress  Cardiology: normal rate and rhythm, no murmur noted Respiratory: clear to auscultation bilaterally  Neurological examination  Mentation: Alert oriented to year and season, not to specific date, month or day of week, she is oriented to state and specific office but not sity/county, she is able to assist  with history taking. Follows all commands speech and language fluent Cranial nerve II-XII: Pupils were equal round reactive to light. Extraocular movements were full, visual field were full  Motor: The motor testing reveals 5 over 5 strength of bilateral upper extremities, 4/5 bilateral lower extremities. Good symmetric motor tone is noted throughout.  Sensory: Sensory testing is intact to soft touch on all 4 extremities. No evidence of extinction is noted.  Coordination: Cerebellar testing reveals good finger-nose-finger and heel-to-shin bilaterally.  Gait and station: Gait is narrow but stable. Reflexes: Deep tendon reflexes are symmetric and normal bilaterally.   DIAGNOSTIC DATA (LABS, IMAGING, TESTING) - I reviewed patient records, labs, notes, testing and imaging myself where available.  MMSE - Mini Mental State Exam 09/11/2019 03/13/2019  Orientation to time 2 2  Orientation to Place 2 4  Registration 3 3  Attention/ Calculation 1 1  Recall 2 3  Language- name 2 objects 2 2  Language- repeat 0 0  Language- follow 3 step command 3 3  Language- read & follow direction 1 1  Write a sentence 1 1  Copy design 0 0  Copy design-comments 5 animals -  Total score 17 20     Lab Results  Component Value Date   WBC 6.4 10/17/2018   HGB 13.2 10/17/2018   HCT 40.7 10/17/2018   MCV 88 10/17/2018   PLT 230  10/17/2018      Component Value Date/Time   NA 135 11/19/2018 0915   K 5.0 11/19/2018 0915   CL 96 11/19/2018 0915   CO2 24 11/19/2018 0915   GLUCOSE 96 11/19/2018 0915   GLUCOSE 97 09/14/2016 0909   BUN 12 11/19/2018 0915   CREATININE 0.84 11/19/2018 0915   CREATININE 0.81 09/14/2016 0909   CALCIUM 9.3 11/19/2018 0915   PROT 7.2 10/17/2018 1124   ALBUMIN 4.7 (H) 10/17/2018 1124   AST 21 10/17/2018 1124   ALT 12 10/17/2018 1124   ALKPHOS 34 (L) 10/17/2018 1124   BILITOT 0.3 10/17/2018 1124   GFRNONAA 64 11/19/2018 0915   GFRNONAA 62 11/19/2015 0905   GFRAA 74 11/19/2018 0915   GFRAA 72 11/19/2015 0905   Lab Results  Component Value Date   CHOL 212 (H) 09/14/2016   HDL 95 09/14/2016   LDLCALC 98 09/14/2016   TRIG 97 09/14/2016   CHOLHDL 2.2 09/14/2016   No results found for: HGBA1C No results found for: VITAMINB12 Lab Results  Component Value Date   TSH 1.150 10/11/2017       ASSESSMENT AND PLAN 86 y.o. year old female  has a past medical history of Eye exam abnormal (11/21/14), GERD (gastroesophageal reflux disease), migraines, Hyperlipidemia, Macular degeneration, Osteoporosis, PUD (peptic ulcer disease) (1980), Restless leg syndrome, Seizure disorder ([facility]) (1997), Tinnitus (05/2009), and Vitamin D deficiency. here with     ICD-10-CM   1. Memory loss  R41.3     Declyn is doing fairly well today.  Her daughters feel that memory has continued to decline.  She is not interested in obtaining an MRI at this time.  She is willing to start Namenda as previously recommended.  We milligrams daily for 1 to 2 weeks then increase to 5 mg twice daily.  I had a lengthy discussion with both her daughters of the need to monitor the administration of medications closely.  She will continue to use her pill container.  Rosey Bath will oversee this for the next few weeks to ensure that she is administering medications correctly.  I  have encouraged her to be physically and mentally active.   She has recently [place] and plans to become more active with community activities.  Healthy lifestyle habits encouraged.  Fall precautions reviewed.  Memory compensation strategies advised.  She will follow-up closely with primary care.  She will return to see me in 3 to 4 months.  She and her daughters all verbalized understanding and agreement with this plan.   No orders of the defined types were placed in this encounter.    Meds ordered this encounter  Medications  . memantine (NAMENDA) 5 MG tablet    Sig: Take 1 tablet (5 mg total) by mouth 2 (two) times daily.    Dispense:  60 tablet    Refill:  5    Order Specific Question:   Supervising Provider    Answer:   Anson Fret [id number]      I spent 30 minutes with the patient. 50% of this time was spent counseling and educating patient on plan of care and medications.     Shawnie Dapper, FNP-C 09/11/2019, 1:03 PM [facility] [address] [place], [place] [address] [phone]

## 2019-09-11 NOTE — Patient Instructions (Signed)
We will start memantine 5mg  twice daily. Start with 1 tablet daily for two weeks. Increase to 1 tablet twice daily if well tolerated.   Stay active, both psysically and mentally active. Stay well hydrated.   Follow up with me in 4 months    Memantine Tablets What is this medicine? MEMANTINE (MEM an teen) is used to treat dementia caused by Alzheimer's disease. This medicine may be used for other purposes; ask your health care provider or pharmacist if you have questions. COMMON BRAND NAME(S): Namenda What should I tell my health care provider before I take this medicine? They need to know if you have any of these conditions:  difficulty passing urine  kidney disease  liver disease  seizures  an unusual or allergic reaction to memantine, other medicines, foods, dyes, or preservatives  pregnant or trying to get pregnant  breast-feeding How should I use this medicine? Take this medicine by mouth with a glass of water. Follow the directions on the prescription label. You may take this medicine with or without food. Take your doses at regular intervals. Do not take your medicine more often than directed. Continue to take your medicine even if you feel better. Do not stop taking except on the advice of your doctor or health care professional. Talk to your pediatrician regarding the use of this medicine in children. Special care may be needed. Overdosage: If you think you have taken too much of this medicine contact a poison control center or emergency room at once. NOTE: This medicine is only for you. Do not share this medicine with others. What if I miss a dose? If you miss a dose, take it as soon as you can. If it is almost time for your next dose, take only that dose. Do not take double or extra doses. If you do not take your medicine for several days, contact your health care provider. Your dose may need to be changed. What may interact with this  medicine?  acetazolamide  amantadine  cimetidine  dextromethorphan  dofetilide  hydrochlorothiazide  ketamine  metformin  methazolamide  quinidine  ranitidine  sodium bicarbonate  triamterene This list may not describe all possible interactions. Give your health care provider a list of all the medicines, herbs, non-prescription drugs, or dietary supplements you use. Also tell them if you smoke, drink alcohol, or use illegal drugs. Some items may interact with your medicine. What should I watch for while using this medicine? Visit your doctor or health care professional for regular checks on your progress. Check with your doctor or health care professional if there is no improvement in your symptoms or if they get worse. You may get drowsy or dizzy. Do not drive, use machinery, or do anything that needs mental alertness until you know how this drug affects you. Do not stand or sit up quickly, especially if you are an older patient. This reduces the risk of dizzy or fainting spells. Alcohol can make you more drowsy and dizzy. Avoid alcoholic drinks. What side effects may I notice from receiving this medicine? Side effects that you should report to your doctor or health care professional as soon as possible:  allergic reactions like skin rash, itching or hives, swelling of the face, lips, or tongue  agitation or a feeling of restlessness  depressed mood  dizziness  hallucinations  redness, blistering, peeling or loosening of the skin, including inside the mouth  seizures  vomiting Side effects that usually do not require medical attention (  report to your doctor or health care professional if they continue or are bothersome):  constipation  diarrhea  headache  nausea  trouble sleeping This list may not describe all possible side effects. Call your doctor for medical advice about side effects. You may report side effects to FDA at 1-800-FDA-1088. Where should I  keep my medicine? Keep out of the reach of children. Store at room temperature between 15 degrees and 30 degrees C (59 degrees and 86 degrees F). Throw away any unused medicine after the expiration date. NOTE: This sheet is a summary. It may not cover all possible information. If you have questions about this medicine, talk to your doctor, pharmacist, or health care provider.  2020 Elsevier/Gold Standard (2013-01-28 14:10:42)   Dementia Dementia is a condition that affects the way the brain functions. It often affects memory and thinking. Usually, dementia gets worse with time and cannot be reversed (progressive dementia). There are many types of dementia, including:  Alzheimer's disease. This type is the most common.  Vascular dementia. This type may happen as the result of a stroke.  Lewy body dementia. This type may happen to people who have Parkinson's disease.  Frontotemporal dementia. This type is caused by damage to nerve cells (neurons) in certain parts of the brain. Some people may be affected by more than one type of dementia. This is called mixed dementia. What are the causes? Dementia is caused by damage to cells in the brain. The area of the brain and the types of cells damaged determine the type of dementia. Usually, this damage is irreversible or cannot be undone. Some examples of irreversible causes include:  Conditions that affect the blood vessels of the brain, such as diabetes, heart disease, or blood vessel disease.  Genetic mutations. In some cases, changes in the brain may be caused by another condition and can be reversed or slowed. Some examples of reversible causes include:  Injury to the brain.  Certain medicines.  Infection, such as meningitis.  Metabolic problems, such as vitamin B12 deficiency or thyroid disease.  Pressure on the brain, such as from a tumor or blood clot. What are the signs or symptoms? Symptoms of dementia depend on the type of  dementia. Common signs of dementia include problems with remembering, thinking, problem solving, decision making, and communicating. These signs develop slowly or get worse with time. This may include:  Problems remembering things.  Having trouble taking a bath or putting clothes on.  Forgetting appointments.  Forgetting to pay bills.  Difficulty planning and preparing meals.  Having trouble speaking.  Getting lost easily. How is this diagnosed? This condition is diagnosed by a specialist (neurologist). It is diagnosed based on the history of your symptoms, your medical history, a physical exam, and tests. Tests may include:  Tests to evaluate brain function, such as memory tests, cognitive tests, and other tests.  Lab tests, such as blood or urine tests.  Imaging tests, such as a CT scan, a PET scan, or an MRI.  Genetic testing. This may be done if other family members have a diagnosis of certain types of dementia. Your health care provider will talk with you and your family, friends, or caregivers about your history and symptoms. How is this treated?  Treatment for this condition depends on the cause of the dementia. Progressive dementias, such as Alzheimer's disease, cannot be cured, but there may be treatments that help to manage symptoms. Treatment might involve taking medicines that may help to:  Control the dementia.  Slow down the progression of the dementia.  Manage symptoms. In some cases, treating the cause of your dementia can improve symptoms, reverse symptoms, or slow down how quickly your dementia becomes worse. Your health care provider can direct you to support groups, organizations, and other health care providers who can help with decisions about your care. Follow these instructions at home: Medicines  Take over-the-counter and prescription medicines only as told by your health care provider.  Use a pill organizer or pill reminder to help you manage your  medicines.  Avoid taking medicines that can affect thinking, such as pain medicines or sleeping medicines. Lifestyle  Make healthy lifestyle choices. ? Be physically active as told by your health care provider. ? Do not use any products that contain nicotine or tobacco, such as cigarettes, e-cigarettes, and chewing tobacco. If you need help quitting, ask your health care provider. ? Do not drink alcohol. ? Practice stress-management techniques when you get stressed. ? Spend time with other people.  Make sure to get quality sleep. These tips can help you get a good night's rest: ? Avoid napping during the day. ? Keep your sleeping area dark and cool. ? Avoid exercising during the few hours before you go to bed. ? Avoid caffeine products in the evening. Eating and drinking  Drink enough fluid to keep your urine pale yellow.  Eat a healthy diet. General instructions   Work with your health care provider to determine what you need help with and what your safety needs are.  Talk with your health care provider about whether it is safe for you to drive.  If you were given a bracelet that identifies you as a person with memory loss or tracks your location, make sure to wear it at all times.  Work with your family to make important decisions, such as advance directives, medical power of attorney, or a living will.  Keep all follow-up visits as told by your health care provider. This is important. Where to find more information  Alzheimer's Association: LimitLaws.huwww.alz.org  [employer] on Aging: CashCowGambling.bewww.nia.nih.gov/alzheimers  World Health Organization: https://castaneda-walker.com/www.who.int Contact a health care provider if:  You have any new or worsening symptoms.  You have problems with choking or swallowing. Get help right away if:  You feel depressed or sad, or feel that you want to harm yourself.  Your family members become concerned for your safety. If you ever feel like you may hurt yourself or  others, or have thoughts about taking your own life, get help right away. You can go to your nearest emergency department or call:  Your local emergency services (911 in the [place]  A suicide crisis helpline, such as the National Suicide Prevention Lifeline at 540-404-21191-762-545-1448. This is open 24 hours a day. Summary  Dementia is a condition that affects the way the brain functions. Dementia often affects memory and thinking.  Usually, dementia gets worse with time and cannot be reversed (progressive dementia).  Treatment for this condition depends on the cause of the dementia.  Work with your health care provider to determine what you need help with and what your safety needs are.  Your health care provider can direct you to support groups, organizations, and other health care providers who can help with decisions about your care. This information is not intended to replace advice given to you by your health care provider. Make sure you discuss any questions you have with your health care provider. Document Revised:  06/26/2018 Document Reviewed: 06/26/2018    Dementia Caregiver Guide Dementia is a term used to describe a number of symptoms that affect memory and thinking. The most common symptoms include:  Memory loss.  Trouble with language and communication.  Trouble concentrating.  Poor judgment.  Problems with reasoning.  Child-like behavior and language.  Extreme anxiety.  Angry outbursts.  Wandering from home or public places. Dementia usually gets worse slowly over time. In the early stages, people with dementia can stay independent and safe with some help. In later stages, they need help with daily tasks such as dressing, grooming, and using the bathroom. How to help the person with dementia cope Dementia can be frightening and confusing. Here are some tips to help the person with dementia cope with changes caused by the disease. General tips  Keep the person on  track with his or her routine.  Try to identify areas where the person may need help.  Be supportive, patient, calm, and encouraging.  Gently remind the person that adjusting to changes takes time.  Help with the tasks that the person has asked for help with.  Keep the person involved in daily tasks and decisions as much as possible.  Encourage conversation, but try not to get frustrated or harried if the person struggles to find words or does not seem to appreciate your help. Communication tips  When the person is talking or seems frustrated, make eye contact and hold the person's hand.  Ask specific questions that need yes or no answers.  Use simple words, short sentences, and a calm voice. Only give one direction at a time.  When offering choices, limit them to just 1 or 2.  Avoid correcting the person in a negative way.  If the person is struggling to find the right words, gently try to help him or her. How to recognize symptoms of stress Symptoms of stress in caregivers include:  Feeling frustrated or angry with the person with dementia.  Denying that the person has dementia or that his or her symptoms will not improve.  Feeling hopeless and unappreciated.  Difficulty sleeping.  Difficulty concentrating.  Feeling anxious, irritable, or depressed.  Developing stress-related health problems.  Feeling like you have too little time for your own life. Follow these instructions at home:   Make sure that you and the person you are caring for: ? Get regular sleep. ? Exercise regularly. ? Eat regular, nutritious meals. ? Drink enough fluid to keep your urine clear or pale yellow. ? Take over-the-counter and prescription medicines only as told by your health care providers. ? Attend all scheduled health care appointments.  Join a support group with others who are caregivers.  Ask about respite care resources so that you can have a regular break from the stress of  caregiving.  Look for signs of stress in yourself and in the person you are caring for. If you notice signs of stress, take steps to manage it.  Consider any safety risks and take steps to avoid them.  Organize medications in a pill box for each day of the week.  Create a plan to handle any legal or financial matters. Get legal or financial advice if needed.  Keep a calendar in a central location to remind the person of appointments or other activities. Tips for reducing the risk of injury  Keep floors clear of clutter. Remove rugs, magazine racks, and floor lamps.  Keep hallways well lit, especially at night.  Put a  handrail and nonslip mat in the bathtub or shower.  Put childproof locks on cabinets that contain dangerous items, such as medicines, alcohol, guns, toxic cleaning items, sharp tools or utensils, matches, and lighters.  Put the locks in places where the person cannot see or reach them easily. This will help ensure that the person does not wander out of the house and get lost.  Be prepared for emergencies. Keep a list of emergency phone numbers and addresses in a convenient area.  Remove car keys and lock garage doors so that the person does not try to get in the car and drive.  Have the person wear a bracelet that tracks locations and identifies the person as having memory problems. This should be worn at all times for safety. Where to find support: Many individuals and organizations offer support. These include:  Support groups for people with dementia and for caregivers.  Counselors or therapists.  Home health care services.  Adult day care centers. Where to find more information Alzheimer's Association: LimitLaws.hu Contact a health care provider if:  The person's health is rapidly getting worse.  You are no longer able to care for the person.  Caring for the person is affecting your physical and emotional health.  The person threatens himself or  herself, you, or anyone else. Summary  Dementia is a term used to describe a number of symptoms that affect memory and thinking.  Dementia usually gets worse slowly over time.  Take steps to reduce the person's risk of injury, and to plan for future care.  Caregivers need support, relief from caregiving, and time for their own lives. This information is not intended to replace advice given to you by your health care provider. Make sure you discuss any questions you have with your health care provider. Document Revised: 03/24/2017 Document Reviewed: 03/15/2016 Elsevier Patient Education  2020 [employer].  Memory Compensation Strategies  1. Use "WARM" strategy.  W= write it down  A= associate it  R= repeat it  M= make a mental note  2.   You can keep a Glass blower/designer.  Use a 3-ring notebook with sections for the following: calendar, important names and phone numbers,  medications, doctors' names/phone numbers, lists/reminders, and a section to journal what you did  each day.   3.    Use a calendar to write appointments down.  4.    Write yourself a schedule for the day.  This can be placed on the calendar or in a separate section of the Memory Notebook.  Keeping a  regular schedule can help memory.  5.    Use medication organizer with sections for each day or morning/evening pills.  You may need help loading it  6.    Keep a basket, or pegboard by the door.  Place items that you need to take out with you in the basket or on the pegboard.  You may also want to  include a message board for reminders.  7.    Use sticky notes.  Place sticky notes with reminders in a place where the task is performed.  For example: " turn off the  stove" placed by the stove, "lock the door" placed on the door at eye level, " take your medications" on  the bathroom mirror or by the place where you normally take your medications.  8.    Use alarms/timers.  Use while cooking to remind yourself to  check on food or as a reminder to  take your medicine, or as a  reminder to make a call, or as a reminder to perform another task, etc.    Fall Prevention in the Home, Adult Falls can cause injuries. They can happen to people of all ages. There are many things you can do to make your home safe and to help prevent falls. Ask for help when making these changes, if needed. What actions can I take to prevent falls? General Instructions  Use good lighting in all rooms. Replace any light bulbs that burn out.  Turn on the lights when you go into a dark area. Use night-lights.  Keep items that you use often in easy-to-reach places. Lower the shelves around your home if necessary.  Set up your furniture so you have a clear path. Avoid moving your furniture around.  Do not have throw rugs and other things on the floor that can make you trip.  Avoid walking on wet floors.  If any of your floors are uneven, fix them.  Add color or contrast paint or tape to clearly mark and help you see: ? Any grab bars or handrails. ? First and last steps of stairways. ? Where the edge of each step is.  If you use a stepladder: ? Make sure that it is fully opened. Do not climb a closed stepladder. ? Make sure that both sides of the stepladder are locked into place. ? Ask someone to hold the stepladder for you while you use it.  If there are any pets around you, be aware of where they are. What can I do in the bathroom?      Keep the floor dry. Clean up any water that spills onto the floor as soon as it happens.  Remove soap buildup in the tub or shower regularly.  Use non-skid mats or decals on the floor of the tub or shower.  Attach bath mats securely with double-sided, non-slip rug tape.  If you need to sit down in the shower, use a plastic, non-slip stool.  Install grab bars by the toilet and in the tub and shower. Do not use towel bars as grab bars. What can I do in the bedroom?  Make sure  that you have a light by your bed that is easy to reach.  Do not use any sheets or blankets that are too big for your bed. They should not hang down onto the floor.  Have a firm chair that has side arms. You can use this for support while you get dressed. What can I do in the kitchen?  Clean up any spills right away.  If you need to reach something above you, use a strong step stool that has a grab bar.  Keep electrical cords out of the way.  Do not use floor polish or wax that makes floors slippery. If you must use wax, use non-skid floor wax. What can I do with my stairs?  Do not leave any items on the stairs.  Make sure that you have a light switch at the top of the stairs and the bottom of the stairs. If you do not have them, ask someone to add them for you.  Make sure that there are handrails on both sides of the stairs, and use them. Fix handrails that are broken or loose. Make sure that handrails are as long as the stairways.  Install non-slip stair treads on all stairs in your home.  Avoid having throw rugs at the top or  bottom of the stairs. If you do have throw rugs, attach them to the floor with carpet tape.  Choose a carpet that does not hide the edge of the steps on the stairway.  Check any carpeting to make sure that it is firmly attached to the stairs. Fix any carpet that is loose or worn. What can I do on the outside of my home?  Use bright outdoor lighting.  Regularly fix the edges of walkways and driveways and fix any cracks.  Remove anything that might make you trip as you walk through a door, such as a raised step or threshold.  Trim any bushes or trees on the path to your home.  Regularly check to see if handrails are loose or broken. Make sure that both sides of any steps have handrails.  Install guardrails along the edges of any raised decks and porches.  Clear walking paths of anything that might make someone trip, such as tools or rocks.  Have any  leaves, snow, or ice cleared regularly.  Use sand or salt on walking paths during winter.  Clean up any spills in your garage right away. This includes grease or oil spills. What other actions can I take?  Wear shoes that: ? Have a low heel. Do not wear high heels. ? Have rubber bottoms. ? Are comfortable and fit you well. ? Are closed at the toe. Do not wear open-toe sandals.  Use tools that help you move around (mobility aids) if they are needed. These include: ? Canes. ? Walkers. ? Scooters. ? Crutches.  Review your medicines with your doctor. Some medicines can make you feel dizzy. This can increase your chance of falling. Ask your doctor what other things you can do to help prevent falls. Where to find more information  Centers for Disease Control and Prevention, STEADI: HealthcareCounselor.com.pt  [employer] on Aging: RingConnections.si Contact a doctor if:  You are afraid of falling at home.  You feel weak, drowsy, or dizzy at home.  You fall at home. Summary  There are many simple things that you can do to make your home safe and to help prevent falls.  Ways to make your home safe include removing tripping hazards and installing grab bars in the bathroom.  Ask for help when making these changes in your home. This information is not intended to replace advice given to you by your health care provider. Make sure you discuss any questions you have with your health care provider. Document Revised: 08/02/2018 Document Reviewed: 11/24/2016 Elsevier Patient Education  2020 [employer].   Elsevier Patient Education  [employer].

## 2019-09-26 NOTE — Progress Notes (Signed)
I reviewed note and agree with plan.   Bairon Klemann R. Tyronne Blann, MD 09/26/2019, 4:23 PM Certified in Neurology, Neurophysiology and Neuroimaging  [facility] [address], [address] [place], [place] [address] [phone]  

## 2019-10-23 NOTE — Patient Instructions (Addendum)
HEALTH MAINTENANCE RECOMMENDATIONS:  It is recommended that you get at least 30 minutes of aerobic exercise at least 5 days/week (for weight loss, you may need as much as 60-90 minutes). This can be any activity that gets your heart rate up. This can be divided in 10-15 minute intervals if needed, but try and build up your endurance at least once a week.  Weight bearing exercise is also recommended twice weekly.  Eat a healthy diet with lots of vegetables, fruits and fiber.  "Colorful" foods have a lot of vitamins (ie green vegetables, tomatoes, red peppers, etc).  Limit sweet tea, regular sodas and alcoholic beverages, all of which has a lot of calories and sugar.  Up to 1 alcoholic drink daily may be beneficial for women (unless trying to lose weight, watch sugars).  Drink a lot of water.  Calcium recommendations are 1200-1500 mg daily (1500 mg for postmenopausal women or women without ovaries), and vitamin D 1000 IU daily.  This should be obtained from diet and/or supplements (vitamins), and calcium should not be taken all at once, but in divided doses.  Monthly self breast exams and yearly mammograms for women over the age of 47 is recommended.  Sunscreen of at least SPF 30 should be used on all sun-exposed parts of the skin when outside between the hours of 10 am and 4 pm (not just when at beach or pool, but even with exercise, golf, tennis, and yard work!)  Use a sunscreen that says "broad spectrum" so it covers both UVA and UVB rays, and make sure to reapply every 1-2 hours.  Remember to change the batteries in your smoke detectors when changing your clock times in the spring and fall.  Use your seat belt every time you are in a car, and please drive safely and not be distracted with cell phones and texting while driving.   Ms. Stennett , Thank you for taking time to come for your Medicare Wellness Visit. I appreciate your ongoing commitment to your health goals. Please review the following  plan we discussed and let me know if I can assist you in the future.   This is a list of the screening recommended for you and due dates:  Health Maintenance  Topic Date Due  . COVID-19 Vaccine (1) Never done  . Flu Shot  11/24/2019  . Tetanus Vaccine  07/24/2021  . DEXA scan (bone density measurement)  Completed  . Pneumonia vaccines  Completed   Continue to get high dose flu shots yearly, in the Fall. Please get [place] the dates of your COVID vaccines so we can enter them into your chart.  I recommend getting the new shingles vaccine (Shingrix). You will need to get this from a pharmacy, as it is covered by Medicare Part D.  It is a series of 2 injections, spaced 2 months apart.  Yearly mammograms are recommended.  You have declined this, and understand that if you ultimately feel something abnormal on a breast exam, and if it is cancer, we will be finding it at a much later stage.  Mammograms help [place] find very early breast cancers.  I recommend using loratidine (Claritin) daily for allergies, instead of Alka Selzer plus. It is safer, and lasts all day. Consider adding Flonase in the Spring/Fall if the claritin alone isn't controlling your allergy symptoms.  Go to the eye doctor yearly.  At your convenience, please get [place] copies of your Living Will and Healthcare Power of [place].  Please check your blood pressure periodically at home, write the values down, and bring both your monitor and your list of blood pressures to your visit with Dr. Jens Som. This way he has more values than just the ones in doctor's offices, to better assess the treatment of your hypertension.  If you bring the monitor, the nurse can check it and verify that it is accurate.  Treatment for osteoporosis is recommended, in order to prevent fractures.  Please let us know if you change your mind and are willing to reconsider treatment.  Your bone density test back in 2002 showed osteoporosis in your hip.  If you do  not want to take more pills, options for treatment include Prolia injections every 6 months (given in our office), or a once yearly IV infusion (done at infusion center/hospital). We likely would need to repeat another bone density test in order to get your insurance to cover the medication (and be a baseline, prior to treatment). You could arrange to have the bone density test and mammogram done on the same day, for convenience. Please be sure to get adequate calcium, vitamin D and weight-bearing exercise as we discussed.

## 2019-10-23 NOTE — Progress Notes (Signed)
Chief Complaint  Patient presents with  . Medicare Wellness    fasting AWV, no new concerns. Did see nuero in May, stopped Namenda due to dizziness after only a couple of days. Feels like she sent back the Cologuard-I will check on that. She isn't opposed to doing pelvic but thinks her insurance did not pay a few years ago.     Alicia [name] is a 86 y.o. female who presents for annual wellness visit and follow-up on chronic medical conditions.  She is accompanied by her daughter.  She moved to [facility] and likes it there.  She manages her own medications (uses pill box).  Last seen by me virtually in April with c/o cough. It was recommended that she take claritin and Mucinex DM.  Her cough is much better.  She is back to taking Herbie Drape plus, just 1/day.  Daughter still hears some cough, it isn't bothersome to patient, prefers to stay on the [place].  She saw neurologist in May for f/u on dementia. She declined getting MRI, but was willing to start Namenda (MMSE had declined to 17).  She took it for a few days, and it made her dizzy, so she stopped.  She prefers not to take anything.  Daughter is aware of the potential decline, and need for more services and assisted living.  Daughter acknowldeges patients stubbornness in this regard, and tries to encourage her to treat her dementia.  She has f/u scheduled in September with neuro. Patient no longer drives.  Hypertension and diastolic dysfunction(grade 1 per echo 11/2015).She continues on lisinopril 2.'5mg'$ .  She is scheduled to f/u with Dr. Stanford Breed later this month. BP Readings from Last 3 Encounters:  10/24/19 (!) 150/84  09/11/19 (!) 151/76  03/13/19 (!) 156/82   She doesn't check BP elsewhere.  She denies dizziness (other than when she took the Namenda), chest pain, palpitations, edema, shortness of breath.  Osteoporosis: She drinks 2-3 glasses of milk daily. She has declined any treatment for osteoporosis in the past,  refusing to take any more medications.She continues to have no interest in discussing treatment of osteoporosis.Still takes Vitamin D; used to get weight-bearing exercise at the gym 2x/week, but none since gyms closed (COVID-19). She has a gym at her new place, and plans to start using it.  Seizure disorder: she hasn't had any seizures since being changed to just the Keppra alone (lamictal stopped). Last seizure was probably in 1998. She prefers to only take Mylan generic for Keppra (got ringing in her ears with other meds in the past).    Immunization History  Administered Date(s) Administered  . Fluad Quad(high Dose 65+) 12/28/2018  . Influenza Split 03/01/2012  . Influenza, High Dose Seasonal PF 01/19/2014, 01/19/2015, 12/30/2015, 12/28/2016, 01/11/2018  . Influenza-Unspecified 12/24/2012, 12/28/2016  . Pneumococcal Conjugate-13 08/13/2014  . Pneumococcal Polysaccharide-23 04/26/1999, 09/01/2006, 09/14/2016  . Td 09/01/2006  . Tdap 07/25/2011  . Zoster 09/10/2015   She had both of her COVID vaccines.  Last Pap smear: 07/2014, normal, with no high risk HPV present Last mammogram: 12/2013(had many excuses over the years--doesn't like the "traffic" to get to [address] for appointment, "I don't think I need it"), not interested. Last colonoscopy: Per Dr. Naaman Plummer first note at [facility], said 2 years prior (so would have been approx 2006); negative Cologard 08/2015. Referred for repeat Cologuard in 09/2018, but not done. She is not interested. Last DEXA: 03/2010 (shows osteoporosis, but pt refuses treatment)--I can't see scanned report in  Epic, but do see T-2.7 at hip from 2002 DEXA. Not repeated as she declines any treatment Ophtho: not sure if she truly goes yearly. Dentist: 2x/year.  Exercise:Nothing other than walking the halls. Has a gym where she lives and plans to start. Lipids: Lab Results  Component Value Date   CHOL 212 (H) 09/14/2016   HDL 95 09/14/2016   LDLCALC 98  09/14/2016   TRIG 97 09/14/2016   CHOLHDL 2.2 09/14/2016   Vitamin D level 07/2011: 59 (on same supplements)  Other doctors caring for patient include: Cardiologist: Dr. Stanford Breed Dentist: Dr. Johney Maine ophtho--Dr. Rebeca Allegra at [facility] [place] for glasses, Dr. Herbert Deaner for cataracts Neuro: Dr. Leta Baptist   Depression screen: negative Fall screen: negative Functional status screen: Notable forurinary leakage (wears pads constantly, unchanged).  Memory issues per HPI.  Wears reading glasses. No longer drives Mini-cog screen: normal  See full screen in Epic.   End of Life Discussion: Patient hasa living will and medical power of attorney  PMH, PSH, SH and FH were reviewed   Outpatient Encounter Medications as of 10/24/2019  Medication Sig Note  . cholecalciferol (VITAMIN D) 1000 UNITS tablet Take 1,000 Units by mouth daily.   . diphenhydramine-acetaminophen (TYLENOL PM) 25-500 MG TABS Take 1 tablet by mouth at bedtime as needed. Takes most nights 09/14/2016: Uses every night  . levETIRAcetam (KEPPRA) 500 MG tablet TAKE 1 TABLET BY MOUTH TWICE A DAY ([place] [phone])   . [DISCONTINUED] lisinopril (ZESTRIL) 2.5 MG tablet Take 1 tablet (2.5 mg total) by mouth daily.   Marland Kitchen acetaminophen (TYLENOL) 325 MG tablet Take 325 mg by mouth every 6 (six) hours as needed. Reported on 09/09/2015 (Patient not taking: Reported on 10/24/2019) 10/17/2018: Takes one daily  . Chlorphen-Phenyleph-ASA (ALKA-SELTZER PLUS COLD PO) Take 1 tablet by mouth daily. (Patient not taking: Reported on 10/24/2019) 09/14/2016: Uses prn allergies/sinuses  . memantine (NAMENDA) 5 MG tablet Take 1 tablet (5 mg total) by mouth 2 (two) times daily. (Patient not taking: Reported on 10/24/2019)    No facility-administered encounter medications on file as of 10/24/2019.   Allergies  Allergen Reactions  . Sulfa Antibiotics Rash    ROS: The patient denies anorexia, fever, weight changes, headaches, seizures(none for years, compliant  with meds), vision changes, ear pain, sore throat, breast concerns, chest pain, palpitations,syncope, dyspnea on exertion, cough(occcasional, related to allergies/PND), swelling, nausea, vomiting, diarrhea, constipation, abdominal pain, melena, hematochezia, vaginal bleeding, discharge, odor or itch, genital lesions, numbness, tingling, weakness, tremor, suspicious skin lesions, depression, abnormal bleeding/bruising, or enlarged lymph nodes.  Occasional heartburn with spicy food (relieved by pepcidtabletas needed).Rarely eats these foods, so rare symptoms +urinary incontinence (chronic), wears pads. Denies dysuria; hematuria Some allergies, with PND and slight cough--uses alka selzer plus once daily with good results. Some leg pain at night, behind her knees. Theragesic helps. This is much better since she moved. Some easy bruising occ constipation Dizzy only from Namenda, resolved Memory issues per HPI Feet are cold a lot. Sometimes her fingers would turn blue, feel numb and be cold, but denies that now.   PHYSICAL EXAM:  BP (!) 150/84   Pulse 84   Ht 5' (1.524 m)   Wt 108 lb 9.6 oz (49.3 kg)   BMI 21.21 kg/m    150/74 on repeat by MD  Wt Readings from Last 3 Encounters:  09/11/19 108 lb (49 kg)  08/07/19 108 lb (49 kg)  03/13/19 111 lb 12.8 oz (50.7 kg)   General Appearance:  Alert, cooperative, no distress,  appears stated ageor slightly younger.   Head:  Normocephalic, without obvious abnormality, atraumatic   Eyes:  PERRL, conjunctiva/corneas clear, EOM's intact, fundi benign   Ears:  Normal TM's and external ear canals   Nose:  Not examined (wearing mask due to COVID-19)  Throat:  Not examined (wearing mask due to COVID-19)  Neck:  Supple, no lymphadenopathy; thyroid: no enlargement/ tenderness/nodules; no carotid bruit or JVD   Back:  Spine nontender, no curvature, ROM normal, no CVA tenderness   Lungs:  Clear to auscultation  bilaterally without wheezes, rales or ronchi; respirations unlabored   Chest Wall:  No tenderness or deformity   Heart:  Regular rate and rhythm, S1 and S2 normal,norub or gallop; 2/6 SEM notedat RUSB  Breast Exam:  No tenderness, masses, or nipple discharge or inversion. No axillary lymphadenopathy   Abdomen:  Soft, non-tender, nondistended, normoactive bowel sounds, no masses, no hepatosplenomegaly   Genitalia:  Pt declined exam  Rectal:  Pt declined exam  Extremities:  No clubbing, cyanosis or edema   Pulses:  2+ and symmetric all extremities. Hands are warm, brisk cap refill.  Skin:  Skin color, texture, turgor normal, no rashes or lesions. Bruises/purpura on forearms, larger ecchymosis on LUE   Lymph nodes:  Cervical, supraclavicular, and axillary nodes normal   Neurologic:  Normal strength, sensation and gait; reflexes 2+ and symmetric throughout    Psych: Normal mood, affect, hygiene and grooming   ASSESSMENT/PLAN:  Medicare annual wellness visit, subsequent  Seizure disorder ([place]) - seizure-free on current regimen  Essential hypertension - BP elevated today. Encouraged low Na diet, daily exercise. Has appt with Dr. Stanford Breed soon--monitor BP and bring list - Plan: Comprehensive metabolic panel  Osteoporosis without current pathological fracture, unspecified osteoporosis type - pt declines treatment. Again reviewed risks, consequences of fractures, and recommendation for treatment  Memory loss - pt didn't tolerate Namenda. Reasons to consider treatment reviewed, pt not interested, daughter understands. has f/u with neuro - Plan: TSH, Vitamin B12  Medication monitoring encounter - Plan: CBC with Differential/Platelet, Comprehensive metabolic panel  Senile purpura (HCC) - contributed by aspirin  c-met, CBC. Check TSH due to feeling cold a lot, B12 given worsening memory (never checked)  Copy of labs sent to Dr.  Stanford Breed, has upcoming appt  Risks of untreated osteoporosis reviewed, declines treatment or f/u DEXA Risks/benefits of mammograms reviewed. She no longer wants mammograms, understands the risk of finding cancer later (only when palpable), which could have a worse prognosis than if found earlier on mammo. She understands and declines.  Discussed monthly self breast exams and yearly mammograms (declines); at least 30 minutes of aerobic activity at least 5 days/week and weight-bearing exercise 2x/week; proper sunscreen use reviewed; healthy diet, including goals of calcium and vitamin D intake and alcohol recommendations (less than or equal to 1 drink/day) reviewed; regular seatbelt use; changing batteries in smoke detectors. Immunization recommendations discussed--continue yearly high dose flu shots. Shingrix recommended, to get from pharmacy.Colon cancer screening--Repeat Cologuard was ordered last year, not done. Discussed again today, including the need for colonoscopy if cologuard is abnormal. She is not interested.   F/u 1 year, sooner prn.   Medicare Attestation I have personally reviewed: The patient's medical and social history Their use of alcohol, tobacco or illicit drugs Their current medications and supplements The patient's functional ability including ADLs,fall risks, home safety risks, cognitive, and hearing and visual impairment Diet and physical activities Evidence for depression or mood disorders  The patient's weight, height, BMI  have been recorded in the chart.  I have made referrals, counseling, and provided education to the patient based on review of the above and I have provided the patient with a written personalized care plan for preventive services.

## 2019-10-24 ENCOUNTER — Other Ambulatory Visit: Payer: Self-pay

## 2019-10-24 ENCOUNTER — Ambulatory Visit (INDEPENDENT_AMBULATORY_CARE_PROVIDER_SITE_OTHER): Payer: Medicare Other | Admitting: Family Medicine

## 2019-10-24 ENCOUNTER — Encounter: Payer: Self-pay | Admitting: Family Medicine

## 2019-10-24 VITALS — BP 150/84 | HR 84 | Ht 60.0 in | Wt 108.6 lb

## 2019-10-24 DIAGNOSIS — I10 Essential (primary) hypertension: Secondary | ICD-10-CM | POA: Diagnosis not present

## 2019-10-24 DIAGNOSIS — R413 Other amnesia: Secondary | ICD-10-CM | POA: Diagnosis not present

## 2019-10-24 DIAGNOSIS — G40909 Epilepsy, unspecified, not intractable, without status epilepticus: Secondary | ICD-10-CM

## 2019-10-24 DIAGNOSIS — D692 Other nonthrombocytopenic purpura: Secondary | ICD-10-CM

## 2019-10-24 DIAGNOSIS — Z0000 Encounter for general adult medical examination without abnormal findings: Secondary | ICD-10-CM

## 2019-10-24 DIAGNOSIS — Z5181 Encounter for therapeutic drug level monitoring: Secondary | ICD-10-CM

## 2019-10-24 DIAGNOSIS — M810 Age-related osteoporosis without current pathological fracture: Secondary | ICD-10-CM

## 2019-10-25 ENCOUNTER — Encounter: Payer: Self-pay | Admitting: Family Medicine

## 2019-10-25 ENCOUNTER — Other Ambulatory Visit: Payer: Self-pay | Admitting: Cardiology

## 2019-10-25 DIAGNOSIS — D692 Other nonthrombocytopenic purpura: Secondary | ICD-10-CM | POA: Insufficient documentation

## 2019-10-25 LAB — COMPREHENSIVE METABOLIC PANEL
ALT: 9 IU/L (ref 0–32)
AST: 22 IU/L (ref 0–40)
Albumin/Globulin Ratio: 1.8 (ref 1.2–2.2)
Albumin: 4.6 g/dL (ref 3.6–4.6)
Alkaline Phosphatase: 36 IU/L — ABNORMAL LOW (ref 48–121)
BUN/Creatinine Ratio: 16 (ref 12–28)
BUN: 13 mg/dL (ref 8–27)
Bilirubin Total: 0.3 mg/dL (ref 0.0–1.2)
CO2: 25 mmol/L (ref 20–29)
Calcium: 9.7 mg/dL (ref 8.7–10.3)
Chloride: 96 mmol/L (ref 96–106)
Creatinine, Ser: 0.82 mg/dL (ref 0.57–1.00)
GFR calc Af Amer: 76 mL/min/{1.73_m2} (ref >59)
GFR calc non Af Amer: 66 mL/min/{1.73_m2} (ref >59)
Globulin, Total: 2.6 g/dL (ref 1.5–4.5)
Glucose: 104 mg/dL — ABNORMAL HIGH (ref 65–99)
Potassium: 5.4 mmol/L — ABNORMAL HIGH (ref 3.5–5.2)
Sodium: 135 mmol/L (ref 134–144)
Total Protein: 7.2 g/dL (ref 6.0–8.5)

## 2019-10-25 LAB — CBC WITH DIFFERENTIAL/PLATELET
Basophils Absolute: 0 10*3/uL (ref 0.0–0.2)
Basos: 1 %
EOS (ABSOLUTE): 0 10*3/uL (ref 0.0–0.4)
Eos: 0 %
Hematocrit: 39.2 % (ref 34.0–46.6)
Hemoglobin: 13.3 g/dL (ref 11.1–15.9)
Immature Grans (Abs): 0 10*3/uL (ref 0.0–0.1)
Immature Granulocytes: 0 %
Lymphocytes Absolute: 1.4 10*3/uL (ref 0.7–3.1)
Lymphs: 20 %
MCH: 29.1 pg (ref 26.6–33.0)
MCHC: 33.9 g/dL (ref 31.5–35.7)
MCV: 86 fL (ref 79–97)
Monocytes Absolute: 0.5 10*3/uL (ref 0.1–0.9)
Monocytes: 7 %
Neutrophils Absolute: 5.2 10*3/uL (ref 1.4–7.0)
Neutrophils: 72 %
Platelets: 263 10*3/uL (ref 150–450)
RBC: 4.57 x10E6/uL (ref 3.77–5.28)
RDW: 13.2 % (ref 11.7–15.4)
WBC: 7.3 10*3/uL (ref 3.4–10.8)

## 2019-10-25 LAB — VITAMIN B12: Vitamin B-12: 595 pg/mL (ref 232–1245)

## 2019-10-25 LAB — TSH: TSH: 1.12 u[IU]/mL (ref 0.450–4.500)

## 2019-11-05 ENCOUNTER — Other Ambulatory Visit: Payer: Self-pay | Admitting: Family Medicine

## 2019-11-05 DIAGNOSIS — G40909 Epilepsy, unspecified, not intractable, without status epilepticus: Secondary | ICD-10-CM

## 2019-11-06 NOTE — Progress Notes (Signed)
HPI: FU hypertension.  Echocardiogram August 2017 showed normal LV function, mild diastolic dysfunction. Since last seen   Current Outpatient Medications  Medication Sig Dispense Refill  . acetaminophen (TYLENOL) 325 MG tablet Take 325 mg by mouth every 6 (six) hours as needed. Reported on 09/09/2015    . Chlorphen-Phenyleph-ASA (ALKA-SELTZER PLUS COLD PO) Take 1 tablet by mouth daily.     . cholecalciferol (VITAMIN D) 1000 UNITS tablet Take 1,000 Units by mouth daily.    . diphenhydramine-acetaminophen (TYLENOL PM) 25-500 MG TABS Take 1 tablet by mouth at bedtime as needed. Takes most nights    . levETIRAcetam (KEPPRA) 500 MG tablet TAKE 1 TABLET BY MOUTH TWICE A DAY ([facility] [phone]) 180 tablet 0  . lisinopril (ZESTRIL) 2.5 MG tablet TAKE 1 TABLET BY MOUTH EVERY DAY 30 tablet 2  . memantine (NAMENDA) 5 MG tablet Take 1 tablet (5 mg total) by mouth 2 (two) times daily. 60 tablet 5   No current facility-administered medications for this visit.     Past Medical History:  Diagnosis Date  . Eye exam abnormal 11/21/14   Dr.Hecker-glaucoma suspect, cataracts, hemorrage  . GERD (gastroesophageal reflux disease)   . Hx of migraines   . Hyperlipidemia   . Macular degeneration   . Osteoporosis    DEXA 03/2010 T-3.2 R hip; declines meds  . PUD (peptic ulcer disease) 1980  . Restless leg syndrome   . Seizure disorder (HCC) 1997   evaluated by [facility] in past  . Tinnitus 05/2009   DR BATES--related to change in med/generic.  Resolved  . Vitamin D deficiency     Past Surgical History:  Procedure Laterality Date  . CATARACT EXTRACTION Bilateral 11/2014, 01/2015   Dr. Elmer Picker  . TONSILLECTOMY    . TUBAL LIGATION      Social History   Socioeconomic History  . Marital status: Widowed    Spouse name: Not on file  . Number of children: 2  . Years of education: Not on file  . Highest education level: High school graduate  Occupational History  . Occupation: retired (from  Community education officer)  Tobacco Use  . Smoking status: Never Smoker  . Smokeless tobacco: Never Used  Vaping Use  . Vaping Use: Never used  Substance and Sexual Activity  . Alcohol use: No  . Drug use: No  . Sexual activity: Not Currently  Other Topics Concern  . Not on file  Social History Narrative   [place]urgess Amor, moved into [place]. Has pull cords in her rooms if she falls (no longer wears bracelet).   Widowed 2009.  1 daughter in [place] (has 3 kids), 1 daughter in [place] (2 children). 5 grandchildren total.  1 Great-grandchild (in [place])   Caffeine- coffee, 1 daily      Updated 10/2019   Social Determinants of Health   Financial Resource Strain:   . Difficulty of Paying Living Expenses:   Food Insecurity:   . Worried About Programme researcher, broadcasting/film/video in the Last Year:   . Barista in the Last Year:   Transportation Needs:   . Freight forwarder (Medical):   Marland Kitchen Lack of Transportation (Non-Medical):   Physical Activity:   . Days of Exercise per Week:   . Minutes of Exercise per Session:   Stress:   . Feeling of Stress :   Social Connections:   . Frequency of Communication with Friends and Family:   . Frequency of Social Gatherings with  Friends and Family:   . Attends Religious Services:   . Active Member of Clubs or Organizations:   . Attends Banker Meetings:   Marland Kitchen Marital Status:   Intimate Partner Violence:   . Fear of Current or Ex-Partner:   . Emotionally Abused:   Marland Kitchen Physically Abused:   . Sexually Abused:     Family History  Problem Relation Age of Onset  . Heart disease Mother   . Dementia Father   . Alzheimer's disease Sister   . Heart disease Brother        65's  . Thyroid disease Sister        removed, thinks benign  . Diabetes Neg Hx   . Cancer Neg Hx     ROS: no fevers or chills, productive cough, hemoptysis, dysphasia, odynophagia, melena, hematochezia, dysuria, hematuria, rash, seizure activity, orthopnea, PND, pedal edema,  claudication. Remaining systems are negative.  Physical Exam: Well-developed well-nourished in no acute distress.  Skin is warm and dry.  HEENT is normal.  Neck is supple.  Chest is clear to auscultation with normal expansion.  Cardiovascular exam is regular rate and rhythm.  1/6 systolic murmur left sternal border. Abdominal exam nontender or distended. No masses palpated. Extremities show no edema. neuro grossly intact  ECG-sinus rhythm at a rate of 75, no ST changes, first-degree AV block. Personally reviewed  A/P  1 hypertension-patient's blood pressure is mildly elevated.  I have asked her to track this and we will advance lisinopril if needed.  2 history of systolic murmur-previous echocardiogram did not show significant aortic stenosis.    Olga Millers, MD

## 2019-11-18 ENCOUNTER — Encounter: Payer: Self-pay | Admitting: Cardiology

## 2019-11-18 ENCOUNTER — Ambulatory Visit (INDEPENDENT_AMBULATORY_CARE_PROVIDER_SITE_OTHER): Payer: Medicare Other | Admitting: Cardiology

## 2019-11-18 ENCOUNTER — Other Ambulatory Visit: Payer: Self-pay

## 2019-11-18 VITALS — BP 144/84 | HR 75 | Ht 60.0 in | Wt 110.2 lb

## 2019-11-18 DIAGNOSIS — R011 Cardiac murmur, unspecified: Secondary | ICD-10-CM

## 2019-11-18 DIAGNOSIS — I10 Essential (primary) hypertension: Secondary | ICD-10-CM | POA: Diagnosis not present

## 2019-11-18 NOTE — Patient Instructions (Signed)
Medication Instructions:  NO CHANGE *If you need a refill on your cardiac medications before your next appointment, please call your pharmacy*   Lab Work: If you have labs (blood work) drawn today and your tests are completely normal, you will receive your results only by: . MyChart Message (if you have MyChart) OR . A paper copy in the mail If you have any lab test that is abnormal or we need to change your treatment, we will call you to review the results.   Follow-Up: At [facility], you and your health needs are our priority.  As part of our continuing mission to provide you with exceptional heart care, we have created designated Provider Care Teams.  These Care Teams include your primary Cardiologist (physician) and Advanced Practice Providers (APPs -  Physician Assistants and Nurse Practitioners) who all work together to provide you with the care you need, when you need it.  We recommend signing up for the patient portal called "MyChart".  Sign up information is provided on this After Visit Summary.  MyChart is used to connect with patients for Virtual Visits (Telemedicine).  Patients are able to view lab/test results, encounter notes, upcoming appointments, etc.  Non-urgent messages can be sent to your provider as well.   To learn more about what you can do with MyChart, go to [email or web address].    Your next appointment:   12 month(s)  The format for your next appointment:   In Person  Provider:   You may see BRIAN CRENSHAW MD or one of the following Advanced Practice Providers on your designated Care Team:    Luke Kilroy, PA-C  Callie Goodrich, PA-C  Jesse Cleaver, FNP     

## 2019-12-02 ENCOUNTER — Telehealth: Payer: Self-pay | Admitting: Cardiology

## 2019-12-02 DIAGNOSIS — I10 Essential (primary) hypertension: Secondary | ICD-10-CM

## 2019-12-02 MED ORDER — LISINOPRIL 10 MG PO TABS: 10.0000 mg | ORAL_TABLET | Freq: Every day | ORAL | 3 refills | Status: DC

## 2019-12-02 NOTE — Telephone Encounter (Signed)
Spoke with pt daughter, Aware of dr crenshaw's recommendations. New script sent to the pharmacy and Lab orders mailed to the pt  

## 2019-12-02 NOTE — Telephone Encounter (Signed)
BP log routed to MD

## 2019-12-02 NOTE — Telephone Encounter (Signed)
Increase lisinopril to 10 mg daily.  Check bmet 1 week. Olga Millers

## 2019-12-02 NOTE — Telephone Encounter (Signed)
New Message   Patients daughter is calling because she was advised to keep record of patients BP and sending them to Dr. Jens Som so he can determine if her BP medication needs to be adjusted.    7/29  169/86 7/30 137/80 7/31 138/83 8/1  155/80

## 2019-12-16 DIAGNOSIS — I10 Essential (primary) hypertension: Secondary | ICD-10-CM | POA: Diagnosis not present

## 2019-12-17 ENCOUNTER — Telehealth: Payer: Self-pay | Admitting: *Deleted

## 2019-12-17 DIAGNOSIS — E875 Hyperkalemia: Secondary | ICD-10-CM

## 2019-12-17 LAB — BASIC METABOLIC PANEL
BUN/Creatinine Ratio: 17 (ref 12–28)
BUN: 12 mg/dL (ref 8–27)
CO2: 26 mmol/L (ref 20–29)
Calcium: 9.1 mg/dL (ref 8.7–10.3)
Chloride: 91 mmol/L — ABNORMAL LOW (ref 96–106)
Creatinine, Ser: 0.7 mg/dL (ref 0.57–1.00)
GFR calc Af Amer: 92 mL/min/{1.73_m2} (ref >59)
GFR calc non Af Amer: 80 mL/min/{1.73_m2} (ref >59)
Glucose: 174 mg/dL — ABNORMAL HIGH (ref 65–99)
Potassium: 5.7 mmol/L — ABNORMAL HIGH (ref 3.5–5.2)
Sodium: 128 mmol/L — ABNORMAL LOW (ref 134–144)

## 2019-12-17 NOTE — Telephone Encounter (Addendum)
Unable to reach pt or leave a message    ----- Message from Lewayne Bunting, MD sent at 12/17/2019  7:19 AM EDT ----- Hold lisinopril; repeat bmet Olga Millers

## 2019-12-18 NOTE — Telephone Encounter (Signed)
Spoke with pt, Aware of dr crenshaw's recommendations.  °

## 2019-12-19 ENCOUNTER — Telehealth: Payer: Self-pay | Admitting: Cardiology

## 2019-12-19 NOTE — Telephone Encounter (Signed)
Spoke with pt daughter, aware lisinopril stopped due to hyperkalemia. She will bring her for repeat labs.

## 2019-12-19 NOTE — Telephone Encounter (Signed)
Spoke with pt, explained reason for stopping lisinopril.

## 2019-12-19 NOTE — Telephone Encounter (Signed)
Patient is wanting someone to call her to explain why she is needing to stop Lisinopril. She states she don't want to stop it until she speaks with someone.

## 2019-12-19 NOTE — Telephone Encounter (Signed)
Follow Up:     Daughter said pt said you had left a message on her phone about her medicine. She is not really sure you called.

## 2019-12-31 DIAGNOSIS — E875 Hyperkalemia: Secondary | ICD-10-CM | POA: Diagnosis not present

## 2019-12-31 LAB — BASIC METABOLIC PANEL
BUN/Creatinine Ratio: 13 (ref 12–28)
BUN: 9 mg/dL (ref 8–27)
CO2: 26 mmol/L (ref 20–29)
Calcium: 8.8 mg/dL (ref 8.7–10.3)
Chloride: 93 mmol/L — ABNORMAL LOW (ref 96–106)
Creatinine, Ser: 0.69 mg/dL (ref 0.57–1.00)
GFR calc Af Amer: 92 mL/min/{1.73_m2} (ref >59)
GFR calc non Af Amer: 80 mL/min/{1.73_m2} (ref >59)
Glucose: 92 mg/dL (ref 65–99)
Potassium: 5.1 mmol/L (ref 3.5–5.2)
Sodium: 132 mmol/L — ABNORMAL LOW (ref 134–144)

## 2020-01-03 ENCOUNTER — Telehealth: Payer: Self-pay | Admitting: *Deleted

## 2020-01-03 MED ORDER — AMLODIPINE BESYLATE 5 MG PO TABS
5.0000 mg | ORAL_TABLET | Freq: Every day | ORAL | 3 refills | Status: DC
Start: 2020-01-03 — End: 2020-01-15

## 2020-01-03 NOTE — Telephone Encounter (Signed)
Advised daughter, verbalized understanding  

## 2020-01-03 NOTE — Telephone Encounter (Signed)
-----   Message from Lewayne Bunting, MD sent at 01/02/2020  7:20 AM EDT ----- Amlodipine 5 mg daily and follow BP Olga Millers ----- Message ----- From: Burnell Blanks, LPN Sent: 06/04/5619   5:32 PM EDT To: Lewayne Bunting, MD  Hello Dr Jens Som  Patient was taking Lisinopril 10 mg daily and was d/c secondary to elevated K+  Blood pressure around 162/76 off the Lisinopril. What would you like her to be on for blood pressure  Thanks  Juliette Alcide

## 2020-01-14 ENCOUNTER — Telehealth: Payer: Self-pay | Admitting: Cardiology

## 2020-01-14 NOTE — Telephone Encounter (Signed)
Increase amlodipine to 10 mg daily and follow blood pressure.  Alicia [name]  

## 2020-01-14 NOTE — Telephone Encounter (Signed)
MyChart message received from patient's daughter on her personal chart - copied message thread into patient's chart   01/03/20 phone note, med changes made.  Message routed to Dr. Jens Som about updated BP since change   Dorena Cookey, MD 4 minutes ago (10:47 AM)   Yes tolorating fine. Blood pressure has come down from 160s but only to 144ish.    You  Wynelle Link Byrd 43 minutes ago (10:08 AM)   She is now on amlodipine 5mg  daily? Tolerating this OK?    , MD 47 minutes ago (10:04 AM)   Of course.  Sorry about that. Her DOB is 02/16/1934   You routed conversation to 10/24/35, RN 1 hour ago (9:50 AM)  Freddi Starr  Bonita Quin, Carollee Herter 1 hour ago (9:50 AM)   Kalman Shan - Without your mom's date of birth, I cannot access her chart to look up anything. Are you able to send a MyChart message on her account, so that all documentation pertaining to her health records stay in her chart instead of yours? Thanks!   Zerita Boers, MD 1 hour ago (9:36 AM)   You moved Mom off the Linisiprol. We have been monitoring her blood pressure and it seems to stay constantly around 144/80. Just wanted to make you aware.

## 2020-01-15 MED ORDER — AMLODIPINE BESYLATE 10 MG PO TABS
10.0000 mg | ORAL_TABLET | Freq: Every day | ORAL | 3 refills | Status: DC
Start: 2020-01-15 — End: 2020-11-16

## 2020-01-15 NOTE — Telephone Encounter (Signed)
Spoke with pt daughter, Aware of dr crenshaw's recommendations. New script sent to the pharmacy. 

## 2020-01-15 NOTE — Telephone Encounter (Signed)
Left message for pt to call.

## 2020-01-16 NOTE — Progress Notes (Deleted)
PATIENT: Alicia [name] DOB: 05-21-1933  REASON FOR VISIT: follow up HISTORY FROM: patient  No chief complaint on file.    HISTORY OF PRESENT ILLNESS: Today 01/16/20 Alicia [name] is a 86 y.o. female here today for follow up for memory loss and seizures. No recent seizure activity on levetiracetam 250mg  QID. PCP continues to follow.   We started memantine 5mg  BID in 08/2019.   HISTORY: (copied from my note on 09/11/2019)  Alicia [name] is a 86 y.o. female here today for follow up for memory loss and seizures. She continues levetiracetam 250mg  four times daily. No recent seizures. PCP managing AED.   Memory seems a little worse. Her daughter, Bary Leriche, presents with her today and assists with history. 97, her youngest daughter, is with via facetime. She did not get MRI and does not wish to do so. She was not interested in taking any new medications when previously seen. She has recently moved into an independent living facility. She continues to manage her medication without difficulty. She is using a pill container. She continues to perform ADL's independently. She does seem more forgetful and repetitive in questioning. She is no longer driving. She did have one fall about 6 weeks ago after tripping over a curb. Her daughter feels that she is usually very stable and id not concerned about falls.    HISTORY: (copied from Dr Rosey Bath note on 03/13/2019)  86 year old female here for evaluation memory loss. Patient has noticed some mild memory loss but does not think this is significant. However patient's daughter is have noted 1 to 2 years of gradual onset progressive short-term memory problems, repeating herself, asking same question over and over, having confusion especially when she is out of her routine. They are concerned about possibility of dementia. She is able to drive short distances to the store. She shops and takes care of her household. He has someone  that helps clean at her home. She is able to take care of her own personal hygiene issues.  Patient has history of seizure disorder since 1997. She has been on lamotrigine and levetiracetam in the past. Last seizure was in 2003. Currently she is on levetiracetam 250 mg 4 times a day, and she takes this dose because she feels this reduces her side effects.  There is family history of dementia in patient's father.    REVIEW OF SYSTEMS: Out of a complete 14 system review of symptoms, the patient complains only of the following symptoms, memory loss and all other reviewed systems are negative.   ALLERGIES: Allergies  Allergen Reactions  . Sulfa Antibiotics Rash    HOME MEDICATIONS: Outpatient Medications Prior to Visit  Medication Sig Dispense Refill  . acetaminophen (TYLENOL) 325 MG tablet Take 325 mg by mouth every 6 (six) hours as needed. Reported on 09/09/2015    . amLODipine (NORVASC) 10 MG tablet Take 1 tablet (10 mg total) by mouth daily. 90 tablet 3  . Chlorphen-Phenyleph-ASA (ALKA-SELTZER PLUS COLD PO) Take 1 tablet by mouth daily.     . cholecalciferol (VITAMIN D) 1000 UNITS tablet Take 1,000 Units by mouth daily.    . diphenhydramine-acetaminophen (TYLENOL PM) 25-500 MG TABS Take 1 tablet by mouth at bedtime as needed. Takes most nights    . levETIRAcetam (KEPPRA) 500 MG tablet TAKE 1 TABLET BY MOUTH TWICE A DAY (NDC [phone]) 180 tablet 0  . memantine (NAMENDA) 5 MG tablet Take 1 tablet (5 mg total) by mouth 2 (two)  times daily. 60 tablet 5   No facility-administered medications prior to visit.    PAST MEDICAL HISTORY: Past Medical History:  Diagnosis Date  . Eye exam abnormal 11/21/14   Dr.Hecker-glaucoma suspect, cataracts, hemorrage  . GERD (gastroesophageal reflux disease)   . Hx of migraines   . Hyperlipidemia   . Macular degeneration   . Osteoporosis    DEXA 03/2010 T-3.2 R hip; declines meds  . PUD (peptic ulcer disease) 1980  . Restless leg  syndrome   . Seizure disorder (HCC) 1997   evaluated by [facility] in past  . Tinnitus 05/2009   DR BATES--related to change in med/generic.  Resolved  . Vitamin D deficiency     PAST SURGICAL HISTORY: Past Surgical History:  Procedure Laterality Date  . CATARACT EXTRACTION Bilateral 11/2014, 01/2015   Dr. Elmer Picker  . TONSILLECTOMY    . TUBAL LIGATION      FAMILY HISTORY: Family History  Problem Relation Age of Onset  . Heart disease Mother   . Dementia Father   . Alzheimer's disease Sister   . Heart disease Brother        24's  . Thyroid disease Sister        removed, thinks benign  . Diabetes Neg Hx   . Cancer Neg Hx     SOCIAL HISTORY: Social History   Socioeconomic History  . Marital status: Widowed    Spouse name: Not on file  . Number of children: 2  . Years of education: Not on file  . Highest education level: High school graduate  Occupational History  . Occupation: retired (from Community education officer)  Tobacco Use  . Smoking status: Never Smoker  . Smokeless tobacco: Never Used  Vaping Use  . Vaping Use: Never used  Substance and Sexual Activity  . Alcohol use: No  . Drug use: No  . Sexual activity: Not Currently  Other Topics Concern  . Not on file  Social History Narrative   Burgess Amor, moved into [place]. Has pull cords in her rooms if she falls (no longer wears bracelet).   Widowed 2009.  1 daughter in [place] (has 3 kids), 1 daughter in [place] (2 children). 5 grandchildren total.  1 Great-grandchild (in [place])   Caffeine- coffee, 1 daily      Updated 10/2019   Social Determinants of Health   Financial Resource Strain:   . Difficulty of Paying Living Expenses: Not on file  Food Insecurity:   . Worried About Programme researcher, broadcasting/film/video in the Last Year: Not on file  . Ran Out of Food in the Last Year: Not on file  Transportation Needs:   . Lack of Transportation (Medical): Not on file  . Lack of Transportation (Non-Medical): Not on file  Physical Activity:     . Days of Exercise per Week: Not on file  . Minutes of Exercise per Session: Not on file  Stress:   . Feeling of Stress : Not on file  Social Connections:   . Frequency of Communication with Friends and Family: Not on file  . Frequency of Social Gatherings with Friends and Family: Not on file  . Attends Religious Services: Not on file  . Active Member of Clubs or Organizations: Not on file  . Attends Banker Meetings: Not on file  . Marital Status: Not on file  Intimate Partner Violence:   . Fear of Current or Ex-Partner: Not on file  . Emotionally Abused: Not on file  .  Physically Abused: Not on file  . Sexually Abused: Not on file      PHYSICAL EXAM  There were no vitals filed for this visit. There is no height or weight on file to calculate BMI.  Generalized: Well developed, in no acute distress  Cardiology: normal rate and rhythm, no murmur noted Respiratory: clear to auscultation bilaterally  Neurological examination  Mentation: Alert oriented to time, place, history taking. Follows all commands speech and language fluent Cranial nerve II-XII: Pupils were equal round reactive to light. Extraocular movements were full, visual field were full  Motor: The motor testing reveals 5 over 5 strength of all 4 extremities. Good symmetric motor tone is noted throughout.  Gait and station: Gait is normal.    DIAGNOSTIC DATA (LABS, IMAGING, TESTING) - I reviewed patient records, labs, notes, testing and imaging myself where available.  MMSE - Mini Mental State Exam 09/11/2019 03/13/2019  Orientation to time 2 2  Orientation to Place 2 4  Registration 3 3  Attention/ Calculation 1 1  Recall 2 3  Language- name 2 objects 2 2  Language- repeat 0 0  Language- follow 3 step command 3 3  Language- read & follow direction 1 1  Write a sentence 1 1  Copy design 0 0  Copy design-comments 5 animals -  Total score 17 20     Lab Results  Component Value Date   WBC  7.3 10/24/2019   HGB 13.3 10/24/2019   HCT 39.2 10/24/2019   MCV 86 10/24/2019   PLT 263 10/24/2019      Component Value Date/Time   NA 132 (L) 12/31/2019 1134   K 5.1 12/31/2019 1134   CL 93 (L) 12/31/2019 1134   CO2 26 12/31/2019 1134   GLUCOSE 92 12/31/2019 1134   GLUCOSE 97 09/14/2016 0909   BUN 9 12/31/2019 1134   CREATININE 0.69 12/31/2019 1134   CREATININE 0.81 09/14/2016 0909   CALCIUM 8.8 12/31/2019 1134   PROT 7.2 10/24/2019 1152   ALBUMIN 4.6 10/24/2019 1152   AST 22 10/24/2019 1152   ALT 9 10/24/2019 1152   ALKPHOS 36 (L) 10/24/2019 1152   BILITOT 0.3 10/24/2019 1152   GFRNONAA 80 12/31/2019 1134   GFRNONAA 62 11/19/2015 0905   GFRAA 92 12/31/2019 1134   GFRAA 72 11/19/2015 0905   Lab Results  Component Value Date   CHOL 212 (H) 09/14/2016   HDL 95 09/14/2016   LDLCALC 98 09/14/2016   TRIG 97 09/14/2016   CHOLHDL 2.2 09/14/2016   No results found for: HGBA1C Lab Results  Component Value Date   VITAMINB12 595 10/24/2019   Lab Results  Component Value Date   TSH 1.120 10/24/2019       ASSESSMENT AND PLAN 86 y.o. year old female  has a past medical history of Eye exam abnormal (11/21/14), GERD (gastroesophageal reflux disease), migraines, Hyperlipidemia, Macular degeneration, Osteoporosis, PUD (peptic ulcer disease) (1980), Restless leg syndrome, Seizure disorder (HCC) (1997), Tinnitus (05/2009), and Vitamin D deficiency. here with   No diagnosis found.   Alicia [name] is doing well. She was encouraged to continue healthy lifestyle habits. Well balanced diet, regular physical and mental activity and adequate hydration encouraged. She will continue close follow up with PCP. We will see her back in 6 months.    No orders of the defined types were placed in this encounter.    No orders of the defined types were placed in this encounter.     I spent 30 minutes  with the patient. 50% of this time was spent counseling and educating patient on plan of care  and medications.     Shawnie Dapper, FNP-C 01/16/2020, 9:43 AM [facility] [address] [place], [place] [address] [phone]

## 2020-01-17 DIAGNOSIS — Z23 Encounter for immunization: Secondary | ICD-10-CM | POA: Diagnosis not present

## 2020-01-20 ENCOUNTER — Encounter: Payer: Self-pay | Admitting: Family Medicine

## 2020-01-20 ENCOUNTER — Ambulatory Visit: Payer: Medicare Other | Admitting: Family Medicine

## 2020-01-27 ENCOUNTER — Other Ambulatory Visit: Payer: Self-pay | Admitting: Family Medicine

## 2020-01-27 DIAGNOSIS — G40909 Epilepsy, unspecified, not intractable, without status epilepticus: Secondary | ICD-10-CM

## 2020-05-15 ENCOUNTER — Other Ambulatory Visit: Payer: Self-pay | Admitting: Cardiology

## 2020-08-11 ENCOUNTER — Other Ambulatory Visit: Payer: Self-pay | Admitting: Family Medicine

## 2020-08-11 DIAGNOSIS — G40909 Epilepsy, unspecified, not intractable, without status epilepticus: Secondary | ICD-10-CM

## 2020-08-11 NOTE — Telephone Encounter (Signed)
Pt doesn't have appt until July.

## 2020-10-27 NOTE — Progress Notes (Deleted)
Alicia [name] is a 87 y.o. female who presents for annual wellness visit and follow-up on chronic medical conditions.  She is accompanied by her daughter.   She previously saw neurology for dementia. She declined getting MRI, but was willing to start Namenda (MMSE had declined to 17).  She took it for a few days, and it made her dizzy, so she stopped it and prefers not to take anything.  Daughter has encouraged her to treat her dementia.  She no-showed her f/u neurology visit in September, with no follow-up planned. Patient no longer drives and lives at [facility]. She continues to manage her own medications (using pill box)  Hypertension and diastolic dysfunction (grade 1 per echo 11/2015). She is under the care of Dr. Stanford Breed.  Last year her potassium was elevated, so low dose of lisinopril was stopped.  BP's were then noted to be running higher, and amlodipine dose was increased to 54m.  This was back in 12/2019.  She hasn't had any follow-up with Dr. CStanford Breedsince that time, though potassium recheck was improved.   She doesn't have any scheduled appointment with Dr. CStanford Breed due within the next month (last visit was 10/2019). Lab Results  Component Value Date   K 5.1 12/31/2019    BP Readings from Last 3 Encounters:  11/18/19 (!) 144/84  10/24/19 (!) 150/84  09/11/19 (!) 151/76   BP's have been running ?checking? She doesn't check BP elsewhere.  She denies dizziness, chest pain, palpitations, edema, shortness of breath.    Osteoporosis: She drinks  2-3 glasses of milk daily.  She has declined any treatment for osteoporosis in the past, refusing to take any more medications. She continues to have no interest in discussing treatment of osteoporosis. Still takes Vitamin D; ??WEIGHT BEARING EXERCISE?--GOING TO GYM AT [employer]?   Seizure disorder: she hasn't had any seizures since being changed to just the Keppra alone (lamictal stopped). Last seizure was probably in  1998. She prefers to only take Mylan generic for Keppra (got ringing in her ears with other meds in the past).     Immunization History  Administered Date(s) Administered   Fluad Quad(high Dose 65+) 12/28/2018   Influenza Split 03/01/2012   Influenza, High Dose Seasonal PF 01/19/2014, 01/19/2015, 12/30/2015, 12/28/2016, 01/11/2018   Influenza-Unspecified 12/24/2012, 12/28/2016   Pneumococcal Conjugate-13 08/13/2014   Pneumococcal Polysaccharide-23 04/26/1999, 09/01/2006, 09/14/2016   Td 09/01/2006   Tdap 07/25/2011   Zoster, Live 09/10/2015   Last Pap smear: 07/2014, normal, with no high risk HPV present Last mammogram:  12/2013 (had many excuses over the years--[facility]'t like [facility] "tra[facility]" [facility], "I don't think I need it"), no longer wants to get mammograms. Last [facility]so would have been approx [facility]); negative Cologard 08/2015. Referred for repeat Cologuard in 09/2018, but not done, declined further screening.  Last DEXA: 03/2010 (shows osteoporosis, but pt refuses treatment)--I can't see scanned report in Epic, but do see T-2.7 at hip from 2002 DEXA. Not repeated as she declines any treatment Ophtho: not sure if she truly goes yearly. Dentist: 2x/year.  Exercise:  Nothing other than walking the halls. Has a gym where she lives and plans to start. UPDATE  Lipids: Lab Results  Component Value Date   CHOL 212 (H) 09/14/2016   HDL 95 09/14/2016   LDLCALC 98 09/14/2016   TRIG 97 09/14/2016   CHOLHDL 2.2 09/14/2016  Vitamin D level 07/2011: 59 (on same supplements)  Other doctors caring for patient include: Cardiologist: Dr. Stanford Breed Dentist:  Dr. Johney Maine ophtho--Dr. Rebeca Allegra at [facility] for glasses, Dr. Herbert Deaner for cataracts Neuro: Dr. Leta Baptist (no longer sees neuro)     Depression screen: negative Fall screen: negative Functional status screen:  Notable for urinary leakage  (wears pads constantly, unchanged).  Memory issues per HPI.  Wears reading glasses. No longer drives  Mini-cog screen: normal  See full screen in Epic.     End of Life Discussion:  Patient has a living will and medical power of attorney  PMH, PSH, SH and FH were reviewed      ROS: The patient denies anorexia, fever, weight changes, headaches, seizures (none for years, compliant with meds), vision changes, ear pain, sore throat, breast concerns, chest pain, palpitations, syncope, dyspnea on exertion, swelling, nausea, vomiting, diarrhea, constipation, abdominal pain, melena, hematochezia, vaginal bleeding, discharge, odor or itch, genital lesions, numbness, tingling, weakness, tremor, suspicious skin lesions, depression, abnormal bleeding/bruising, or enlarged lymph nodes.   Occasional heartburn with spicy food (relieved by pepcid tablet as needed). Rarely eats these foods, so rare symptoms +urinary incontinence (chronic), wears pads.  Denies dysuria; hematuria Some allergies, with PND and slight cough--uses alka selzer plus once daily with good results. Some leg pain at night, behind her knees.  Theragesic helps. This is much better since she moved. Some easy bruising occ constipation Memory issues per HPI   PHYSICAL EXAM:  Wt Readings from Last 3 Encounters:  11/18/19 110 lb 3.2 oz (50 kg)  10/24/19 108 lb 9.6 oz (49.3 kg)  09/11/19 108 lb (49 kg)   General Appearance:    Alert, cooperative, no distress, appears stated age or slightly younger.    Head:    Normocephalic, without obvious abnormality, atraumatic     Eyes:    PERRL, conjunctiva/corneas clear, EOM's intact, fundi benign     Ears:    Normal TM's and external ear canals     Nose:    Not examined (wearing mask due to COVID-19)   Throat:    Not examined (wearing mask due to COVID-19)  Neck:    Supple, no lymphadenopathy; thyroid: no enlargement/ tenderness/nodules; no carotid bruit or JVD     Back:    Spine nontender, no  curvature, ROM normal, no CVA tenderness   Lungs:    Clear to auscultation bilaterally without wheezes, rales or ronchi; respirations unlabored     Chest Wall:    No tenderness or deformity     Heart:    Regular rate and rhythm, S1 and S2 normal, no rub or gallop; 2/6 SEM noted at RUSB  Breast Exam:    No tenderness, masses, or nipple discharge or inversion. No axillary lymphadenopathy     Abdomen:    Soft, non-tender, nondistended, normoactive bowel sounds, no masses, no hepatosplenomegaly     Genitalia:    Pt declined exam  Rectal:    Pt declined exam  Extremities:    No clubbing, cyanosis or edema     Pulses:    2+ and symmetric all extremities. Hands are warm, brisk cap refill.   Skin:    Skin color, texture, turgor normal, no rashes or lesions. Bruises/purpura on forearms    Lymph nodes:    Cervical, supraclavicular, and axillary nodes normal     Neurologic:    Normal strength, sensation and gait; reflexes 2+ and symmetric throughout  Psych:   Normal mood, affect, hygiene and grooming  Murmur Update skin--purpura/bruising on forearms  ASSESSMENT/PLAN:  ENTER COVID VACCINES AND BOOSTERS (and if only has had 1 booster, offer 2nd, if due) ENTER FLU SHOT DATE  Due to f/u with Dr. Stanford Breed, no appt scheduled. Tetanus booster will be due 07/2021, to get from pharmacy Shingrix recommended, from pharmacy  C-met, cbc Copy of labs sent to Dr. Stanford Breed  Refill Keppra  Risks of untreated osteoporosis reviewed, declines treatment or f/u DEXA Risks/benefits of mammograms reviewed. She no longer wants mammograms, understands the risk of finding cancer later (only when palpable), which could have a worse prognosis than if found earlier on mammo. She understands and declines.   Discussed monthly self breast exams and yearly mammograms (declines); at least 30 minutes of aerobic activity at least 5 days/week  and weight-bearing exercise 2x/week; proper sunscreen use  reviewed; healthy diet, including goals of calcium and vitamin D intake and alcohol recommendations (less than or equal to 1 drink/day) reviewed; regular seatbelt use; changing batteries in smoke detectors.  Immunization recommendations discussed--continue yearly high dose flu shots. Shingrix recommended, to get from pharmacy. Colon cancer screening-- Repeat Cologuard was ordered last year, not done. Discussed again today, including the need for colonoscopy if cologuard is abnormal. She is not interested.     F/u 1 year, sooner prn.   Medicare Attestation I have personally reviewed: The patient's medical and social history Their use of alcohol, tobacco or illicit drugs Their current medications and supplements The patient's functional ability including ADLs,fall risks, home safety risks, cognitive, and hearing and visual impairment Diet and physical activities Evidence for depression or mood disorders  The patient's weight, height, BMI have been recorded in the chart.  I have made referrals, counseling, and provided education to the patient based on review of the above and I have provided the patient with a written personalized care plan for preventive services.

## 2020-10-29 ENCOUNTER — Ambulatory Visit: Payer: Medicare Other | Admitting: Family Medicine

## 2020-11-03 ENCOUNTER — Other Ambulatory Visit: Payer: Self-pay | Admitting: Family Medicine

## 2020-11-03 DIAGNOSIS — G40909 Epilepsy, unspecified, not intractable, without status epilepticus: Secondary | ICD-10-CM

## 2020-11-15 NOTE — Patient Instructions (Addendum)
HEALTH MAINTENANCE RECOMMENDATIONS:   It is recommended that you get at least 30 minutes of aerobic exercise at least 5 days/week (for weight loss, you may need as much as 60-90 minutes). This can be any activity that gets your heart rate up. This can be divided in 10-15 minute intervals if needed, but try and build up your endurance at least once a week.  Weight bearing exercise is also recommended twice weekly.   Eat a healthy diet with lots of vegetables, fruits and fiber.  "Colorful" foods have a lot of vitamins (ie green vegetables, tomatoes, red peppers, etc).  Limit sweet tea, regular sodas and alcoholic beverages, all of which has a lot of calories and sugar.  Up to 1 alcoholic drink daily may be beneficial for women (unless trying to lose weight, watch sugars).  Drink a lot of water.   Calcium recommendations are 1200-1500 mg daily (1500 mg for postmenopausal women or women without ovaries), and vitamin D 1000 IU daily.  This should be obtained from diet and/or supplements (vitamins), and calcium should not be taken all at once, but in divided doses.   Monthly self breast exams and yearly mammograms for women over the age of 41 is recommended.   Sunscreen of at least SPF 30 should be used on all sun-exposed parts of the skin when outside between the hours of 10 am and 4 pm (not just when at beach or pool, but even with exercise, golf, tennis, and yard work!)  Use a sunscreen that says "broad spectrum" so it covers both UVA and UVB rays, and make sure to reapply every 1-2 hours.   Remember to change the batteries in your smoke detectors when changing your clock times in the spring and fall. Carbon monoxide detectors are recommended for your home.   Use your seat belt every time you are in a car, and please drive safely and not be distracted with cell phones and texting while driving.     Alicia [name] , Thank you for taking time to come for your Medicare Wellness Visit. I appreciate your  ongoing commitment to your health goals. Please review the following plan we discussed and let me know if I can assist you in the future.     This is a list of the screening recommended for you and due dates:      Health Maintenance  Topic Date Due   COVID-19 Vaccine (1) Never done   Zoster (Shingles) Vaccine (1 of 2) Never done   Flu Shot 11/23/2020   Tetanus Vaccine 07/24/2021   DEXA scan (bone density measurement) Completed   Pneumonia vaccines Completed   HPV Vaccine Aged Out    COVID vaccine dates hadn't been received by our office, so not in your chart. We now have 3 listed in your chart.   Continue yearly high dose flu shots.   Your next tetanus shot (TdaP) will be due in April 2023. You will need to get this from the pharmacy (covered by Medicare Part D; we cannot give it to you in our office, it won't be covered at all).   I recommend getting the new shingles vaccine (Shingrix). Since you have Medicare, you will need to get this from the pharmacy, as it is covered by Part D. This is a series of 2 injections, spaced 2 months apart. This is 90% effective in preventing shingles, which is very painful and the pain can be quite prolonged. The side effects to the vaccine are  much shorter-lived.   Instead take claritin once daily. I'd prefer you to take Tylenol Arthritis scheduled once in the morning, rather than multiple pills throughout the day. It is best to not have regular tylenol or alka selzer cold plus in the house, so that you aren't taking too much of the medications in combination.  Try walking with light weights (wrist or hand weights), as this weight-bearing exercise will help prevent further bone loss.  Please schedule a routine eye exam (previously saw Dr. Norman Herrlich at [facility] and Dr. Elmer Picker)  Be sure that the Vitamin D is in your pill box to take daily.  You are due to see Dr. Jens Som (last seen 10/2019).

## 2020-11-15 NOTE — Progress Notes (Signed)
Chief Complaint  Patient presents with   Medicare Wellness    Fasting (has orange juice, small glass) AWV. Daughter mentions that she thinks patient may be taking 7-8 Tylenol (at least) daily.     Alicia [name] is a 87 y.o. female who presents for annual wellness visit and follow-up on chronic medical conditions.  She is accompanied by her daughter, Clarene Critchley.   She previously saw neurology for dementia. She declined getting MRI, but was willing to start Namenda (MMSE had declined to 17).  She took it for a few days, and it made her dizzy, so she stopped it and prefers not to take anything.  Daughter has encouraged her to treat her dementia.  She no-showed her f/u neurology visit in September, with no follow-up planned. Patient no longer drives and lives at [facility]. She continues to manage her own medications (using pill box).  Daughter reports she is doing very well with her blood pressure and seizure medications (frequently checks the box to make sure that she has taken them). Doesn't notice significant change in memory, just mild/gradual.  Mostly repetitive questions. Her daughter is concerned about her Tylenol intake (frequently requesting her to buy more from the store). She takes Tylenol daily ($RemoveBefore'325mg'zlhCZksMaXMKa$ )--taking 7-9/day (as calculated by daughter, pt denies taking that many), takes for headaches, and takes tylenol PM at bedtime.  She  takes alka selzer cold plus once daily. She describes "light headaches" across her forehead, which she has for most of the day.  Has one now, hasn't eaten yet today (but had orange juice).   Hypertension and diastolic dysfunction (grade 1 per echo 11/2015). She is under the care of Dr. Stanford Breed.  Last year her potassium was elevated, so low dose of lisinopril was stopped.  BP's were then noted to be running higher, and amlodipine dose was increased to $RemoveBefo'10mg'sfLcMybtqfl$ .  This was back in 12/2019.  She hasn't had any follow-up with Dr. Stanford Breed since that time, though  potassium recheck was improved.   She doesn't have any scheduled appointment with Dr. Stanford Breed, due now (last visit was 10/2019). Lab Results  Component Value Date   K 5.1 12/31/2019   BP Readings from Last 3 Encounters:  11/18/19 (!) 144/84  10/24/19 (!) 150/84  09/11/19 (!) 151/76   BP's haven't been checke recently (checks for a short time when asked, and hasn't recently; puts the monitor away, doesn't like clutter, per daughter). She denies dizziness, chest pain, palpitations, edema, shortness of breath, edema. Frequent "light headaches"   Osteoporosis: She drinks  2-3 glasses of milk daily.  She has declined any treatment for osteoporosis in the past, refusing to take any more medications. She continues to have no interest in discussing treatment of osteoporosis. Still takes Vitamin D. She enjoys walking outside, doesn't go to the gym at her residence. No weight-bearing exercise.   Seizure disorder: she hasn't had any seizures since being changed to just the Keppra alone (lamictal stopped). Last seizure was probably in 1998. She prefers to only take Mylan generic for Keppra (got ringing in her ears with other meds in the past).     Immunization History  Administered Date(s) Administered   Fluad Quad(high Dose 65+) 12/28/2018, 01/17/2020   Influenza Split 03/01/2012   Influenza, High Dose Seasonal PF 01/19/2014, 01/19/2015, 12/30/2015, 12/28/2016, 01/11/2018   Influenza-Unspecified 12/24/2012, 12/28/2016   PFIZER(Purple Top)SARS-COV-2 Vaccination 05/15/2019, 06/05/2019, 03/02/2020   Pneumococcal Conjugate-13 08/13/2014   Pneumococcal Polysaccharide-23 04/26/1999, 09/01/2006, 09/14/2016   Td 09/01/2006  Tdap 07/25/2011   Zoster, Live 09/10/2015    Got flu shot [facility], and she states she signed up to get her COVID booster soon. Last Pap smear: 07/2014, normal, with no high risk HPV present Last mammogram:  12/2013 (had many excuses over the years--doesn't like the  "traffic" to get to [address] for appointment, "I don't think I need it"), no longer wants to get mammograms. Last colonoscopy: Per Dr. Naaman Plummer first note at [facility], said 2 years prior (so would have been approx 2006); negative Cologard 08/2015. Referred for repeat Cologuard in 09/2018, but not done, declined further screening.  Last DEXA: 03/2010 (shows osteoporosis, but pt refuses treatment)--I can't see scanned report in Epic, but do see T-2.7 at hip from 2002 DEXA. Not repeated as she declines any treatment Ophtho: hasn't gone regularly since her cataract surgery Dentist: 2x/year.  She has had extractions, has temporary partial denture currently, had lots of fillings Exercise:  walks most days (3 laps around the building, outside weather permitting, for maybe 30 minutes).   Lipids: Lab Results  Component Value Date   CHOL 212 (H) 09/14/2016   HDL 95 09/14/2016   LDLCALC 98 09/14/2016   TRIG 97 09/14/2016   CHOLHDL 2.2 09/14/2016    Vitamin D level 07/2011: 59.  Currently isn't sure if she has been taking her Vitamin D   Other doctors caring for patient include: Cardiologist: Dr. Stanford Breed Dentist:  Dr. Johney Maine ophtho--Dr. Rebeca Allegra at [place] for glasses, Dr. Herbert Deaner for cataracts Neuro: Dr. Leta Baptist (no longer sees neuro)     Depression screen: negative Fall screen: negative Functional status screen:  Notable for urinary leakage (wears pads constantly, unchanged).  Memory issues per HPI.  Wears reading glasses. No longer drives. Daughter manages finances Mini-cog screen: Score of 3/5. She had 3/3 recall.  Clock drawing--missing one hand; numbers in correct positions   See full screen in Epic.     End of Life Discussion:  Patient has a living will and medical power of attorney   PMH, PSH, SH and FH were reviewed     Outpatient Encounter Medications as of 11/16/2020  Medication Sig Note   acetaminophen (TYLENOL) 325 MG tablet Take 325 mg by mouth every 6 (six) hours  as needed. Reported on 09/09/2015    amLODipine (NORVASC) 10 MG tablet Take 10 mg by mouth daily.    Chlorphen-Phenyleph-ASA (ALKA-SELTZER PLUS COLD PO) Take 1 tablet by mouth daily.     diphenhydramine-acetaminophen (TYLENOL PM) 25-500 MG TABS Take 1 tablet by mouth at bedtime as needed. Takes most nights    [DISCONTINUED] levETIRAcetam (KEPPRA) 500 MG tablet TAKE 1 TABLET BY MOUTH TWICE A DAY *NDC 23536144315*    [DISCONTINUED] lisinopril (ZESTRIL) 10 MG tablet Take 10 mg by mouth daily.    cholecalciferol (VITAMIN D) 1000 UNITS tablet Take 1,000 Units by mouth daily. (Patient not taking: Reported on 11/16/2020) 11/16/2020: Doesn't think she has been taking this   levETIRAcetam (KEPPRA) 500 MG tablet TAKE 1 TABLET BY MOUTH TWICE A DAY *NDC [id number]*    [DISCONTINUED] amLODipine (NORVASC) 10 MG tablet Take 1 tablet (10 mg total) by mouth daily.    [DISCONTINUED] memantine (NAMENDA) 5 MG tablet Take 1 tablet (5 mg total) by mouth 2 (two) times daily.    No facility-administered encounter medications on file as of 11/16/2020.   Allergies  Allergen Reactions   Sulfa Antibiotics Rash     ROS: The patient denies anorexia, fever, weight changes, seizures (none for  years, compliant with meds), vision changes, ear pain, sore throat, breast concerns, chest pain, palpitations, syncope, dyspnea on exertion, swelling, nausea, vomiting, diarrhea, constipation, abdominal pain, melena, hematochezia, vaginal bleeding, discharge, odor or itch, genital lesions, numbness, tingling, weakness, tremor, suspicious skin lesions, depression, abnormal bleeding/bruising, or enlarged lymph nodes.   Tylenol PM is needed for sleep every night. +urinary incontinence (chronic), wears pads.  Denies dysuria; hematuria Some allergies, with PND and slight wet cough--uses alka selzer plus once daily. It helps, but she has frequent frontal headaches for which she takes tylenol throughout the day. Some leg pain at night, behind her  knees.  Theragesic helps. Infrequent occ constipation Memory issues per HPI     PHYSICAL EXAM:   BP 120/70   Pulse 88   Ht 5' (1.524 m)   Wt 109 lb 12.8 oz (49.8 kg)   BMI 21.44 kg/m   Wt Readings from Last 3 Encounters:  11/16/20 109 lb 12.8 oz (49.8 kg)  11/18/19 110 lb 3.2 oz (50 kg)  10/24/19 108 lb 9.6 oz (49.3 kg)   General Appearance:    Alert, cooperative, no distress, appears stated age or slightly younger.    Head:    Normocephalic, without obvious abnormality, atraumatic   Eyes:    PERRL, conjunctiva/corneas clear, EOM's intact, fundi benign     Ears:    Normal TM's and external ear canals     Nose:    Nasal mucosa with mild edema, some crusting R nares. Sinuses are nontender  Throat:    Not examined (wearing mask due to COVID-19)  Neck:    Supple, no lymphadenopathy; thyroid: no enlargement/ tenderness/nodules; no carotid bruit or JVD     Back:    Spine nontender, no curvature, ROM normal, no CVA tenderness  Lungs:    Clear to auscultation bilaterally without wheezes, rales or ronchi; respirations unlabored     Chest Wall:    No tenderness or deformity     Heart:    Regular rate and rhythm, S1 and S2 normal, no rub or gallop; no murmur appreciable today  Breast Exam:    No tenderness, masses, or nipple discharge or inversion. No axillary lymphadenopathy     Abdomen:    Soft, non-tender, nondistended, normoactive bowel sounds, no masses, no hepatosplenomegaly     Genitalia:    Pt declined exam  Rectal:    Pt declined exam  Extremities:    No clubbing, cyanosis or edema     Pulses:    2+ and symmetric all extremities.   Skin:    Skin color, texture, turgor normal, no rashes or lesions. Small purpura on upper forearms    Lymph    nodes:    Cervical, supraclavicular, and axillary nodes normal     Neurologic:    Normal strength, sensation and gait; reflexes 2+ and symmetric throughout                 Psych:   Normal mood, affect, hygiene and grooming     ASSESSMENT/PLAN:    Medicare annual wellness visit, subsequent  Essential hypertension - Well controlled on current medication regimen from cardiologist. Due for f/u (meds will last until Sept, advised to schedule) - Plan: Comprehensive metabolic panel  Osteoporosis without current pathological fracture, unspecified osteoporosis type - Declines treatment, understands risks.  Discussed Ca intake, to ensure taking D vitamin daily. Weight-bearing exercise encouraged  Senile purpura (HCC)  Medication monitoring encounter - Plan: CBC with Differential/Platelet, Comprehensive metabolic panel  Seizure disorder ([place]) - compliant with meds with no further seizures. cont Keppra - Plan: levETIRAcetam (KEPPRA) 500 MG tablet  Frontal headache - suspect sinus HA. To stop alka selzer plus, start claritin. Limit Tylenol, risks of overuse reviewed.   Due to f/u with Dr. Stanford Breed, no appt scheduled. Tetanus booster will be due 07/2021, to get from pharmacy Shingrix recommended, from pharmacy   C-met, cbc (had some orange juice this morning)  Copy of labs sent to Dr. Stanford Breed     Risks of untreated osteoporosis reviewed, declines treatment or f/u DEXA Risks/benefits of mammograms reviewed. She no longer wants mammograms, understands the risk of finding cancer later (only when palpable), which could have a worse prognosis than if found earlier on mammo. She understands and declines. Recommended at least 30 minutes of aerobic activity at least 5 days/week  and weight-bearing exercise 2x/week; proper sunscreen use reviewed; healthy diet, including goals of calcium and vitamin D intake and alcohol recommendations (less than or equal to 1 drink/day) reviewed; regular seatbelt use.  Immunization recommendations discussed--continue yearly high dose flu shots. Shingrix recommended, to get from pharmacy. Tdap will be due in 2023. Colon cancer screening-- declines further screening (reasonable given age), advised  to let us know if any change in bowel habits, abdominal pain or other concerns.     F/u 1 year, sooner prn.     Medicare Attestation I have personally reviewed: The patient's medical and social history Their use of alcohol, tobacco or illicit drugs Their current medications and supplements The patient's functional ability including ADLs,fall risks, home safety risks, cognitive, and hearing and visual impairment Diet and physical activities Evidence for depression or mood disorders   The patient's weight, height, BMI have been recorded in the chart.  I have made referrals, counseling, and provided education to the patient based on review of the above and I have provided the patient with a written personalized care plan for preventive services.

## 2020-11-16 ENCOUNTER — Encounter: Payer: Self-pay | Admitting: Family Medicine

## 2020-11-16 ENCOUNTER — Other Ambulatory Visit: Payer: Self-pay

## 2020-11-16 ENCOUNTER — Ambulatory Visit (INDEPENDENT_AMBULATORY_CARE_PROVIDER_SITE_OTHER): Payer: Medicare Other | Admitting: Family Medicine

## 2020-11-16 VITALS — BP 120/70 | HR 88 | Ht 60.0 in | Wt 109.8 lb

## 2020-11-16 DIAGNOSIS — M810 Age-related osteoporosis without current pathological fracture: Secondary | ICD-10-CM

## 2020-11-16 DIAGNOSIS — R519 Headache, unspecified: Secondary | ICD-10-CM

## 2020-11-16 DIAGNOSIS — G40909 Epilepsy, unspecified, not intractable, without status epilepticus: Secondary | ICD-10-CM

## 2020-11-16 DIAGNOSIS — Z5181 Encounter for therapeutic drug level monitoring: Secondary | ICD-10-CM | POA: Diagnosis not present

## 2020-11-16 DIAGNOSIS — Z0000 Encounter for general adult medical examination without abnormal findings: Secondary | ICD-10-CM

## 2020-11-16 DIAGNOSIS — I10 Essential (primary) hypertension: Secondary | ICD-10-CM | POA: Diagnosis not present

## 2020-11-16 DIAGNOSIS — D692 Other nonthrombocytopenic purpura: Secondary | ICD-10-CM | POA: Diagnosis not present

## 2020-11-16 MED ORDER — LEVETIRACETAM 500 MG PO TABS: ORAL_TABLET | ORAL | 3 refills | Status: DC

## 2020-11-17 ENCOUNTER — Encounter: Payer: Self-pay | Admitting: Family Medicine

## 2020-11-17 LAB — COMPREHENSIVE METABOLIC PANEL
ALT: 7 IU/L (ref 0–32)
AST: 17 IU/L (ref 0–40)
Albumin/Globulin Ratio: 1.8 (ref 1.2–2.2)
Albumin: 4.6 g/dL (ref 3.6–4.6)
Alkaline Phosphatase: 33 IU/L — ABNORMAL LOW (ref 44–121)
BUN/Creatinine Ratio: 12 (ref 12–28)
BUN: 11 mg/dL (ref 8–27)
Bilirubin Total: 0.3 mg/dL (ref 0.0–1.2)
CO2: 26 mmol/L (ref 20–29)
Calcium: 9.5 mg/dL (ref 8.7–10.3)
Chloride: 86 mmol/L — ABNORMAL LOW (ref 96–106)
Creatinine, Ser: 0.89 mg/dL (ref 0.57–1.00)
Globulin, Total: 2.6 g/dL (ref 1.5–4.5)
Glucose: 105 mg/dL — ABNORMAL HIGH (ref 65–99)
Potassium: 4.9 mmol/L (ref 3.5–5.2)
Sodium: 128 mmol/L — ABNORMAL LOW (ref 134–144)
Total Protein: 7.2 g/dL (ref 6.0–8.5)
eGFR: 63 mL/min/{1.73_m2} (ref >59)

## 2020-11-17 LAB — CBC WITH DIFFERENTIAL/PLATELET
Basophils Absolute: 0 10*3/uL (ref 0.0–0.2)
Basos: 1 %
EOS (ABSOLUTE): 0.1 10*3/uL (ref 0.0–0.4)
Eos: 1 %
Hematocrit: 37.2 % (ref 34.0–46.6)
Hemoglobin: 12.2 g/dL (ref 11.1–15.9)
Immature Grans (Abs): 0 10*3/uL (ref 0.0–0.1)
Immature Granulocytes: 1 %
Lymphocytes Absolute: 1.9 10*3/uL (ref 0.7–3.1)
Lymphs: 25 %
MCH: 28.2 pg (ref 26.6–33.0)
MCHC: 32.8 g/dL (ref 31.5–35.7)
MCV: 86 fL (ref 79–97)
Monocytes Absolute: 0.7 10*3/uL (ref 0.1–0.9)
Monocytes: 9 %
Neutrophils Absolute: 4.8 10*3/uL (ref 1.4–7.0)
Neutrophils: 63 %
Platelets: 273 10*3/uL (ref 150–450)
RBC: 4.32 x10E6/uL (ref 3.77–5.28)
RDW: 13.3 % (ref 11.7–15.4)
WBC: 7.5 10*3/uL (ref 3.4–10.8)

## 2020-12-14 ENCOUNTER — Ambulatory Visit (INDEPENDENT_AMBULATORY_CARE_PROVIDER_SITE_OTHER): Payer: Medicare Other | Admitting: Family Medicine

## 2020-12-14 ENCOUNTER — Other Ambulatory Visit: Payer: Self-pay

## 2020-12-14 ENCOUNTER — Encounter: Payer: Self-pay | Admitting: Family Medicine

## 2020-12-14 VITALS — BP 122/60 | HR 68 | Temp 97.7°F | Ht 60.0 in | Wt 104.4 lb

## 2020-12-14 DIAGNOSIS — R531 Weakness: Secondary | ICD-10-CM | POA: Diagnosis not present

## 2020-12-14 DIAGNOSIS — R410 Disorientation, unspecified: Secondary | ICD-10-CM | POA: Diagnosis not present

## 2020-12-14 DIAGNOSIS — Z5181 Encounter for therapeutic drug level monitoring: Secondary | ICD-10-CM

## 2020-12-14 DIAGNOSIS — R350 Frequency of micturition: Secondary | ICD-10-CM | POA: Diagnosis not present

## 2020-12-14 DIAGNOSIS — R634 Abnormal weight loss: Secondary | ICD-10-CM

## 2020-12-14 DIAGNOSIS — G40909 Epilepsy, unspecified, not intractable, without status epilepticus: Secondary | ICD-10-CM

## 2020-12-14 LAB — POCT URINALYSIS DIP (PROADVANTAGE DEVICE)
Bilirubin, UA: NEGATIVE
Glucose, UA: NEGATIVE mg/dL
Ketones, POC UA: NEGATIVE mg/dL
Leukocytes, UA: NEGATIVE
Nitrite, UA: NEGATIVE
Protein Ur, POC: NEGATIVE mg/dL
Specific Gravity, Urine: 1.025
Urobilinogen, Ur: NEGATIVE
pH, UA: 6 (ref 5.0–8.0)

## 2020-12-14 LAB — CBC WITH DIFFERENTIAL/PLATELET
Basophils Absolute: 0 10*3/uL (ref 0.0–0.2)
Basos: 1 %
EOS (ABSOLUTE): 0.1 10*3/uL (ref 0.0–0.4)
Eos: 1 %
Hematocrit: 37.6 % (ref 34.0–46.6)
Hemoglobin: 12.6 g/dL (ref 11.1–15.9)
Lymphocytes Absolute: 1.8 10*3/uL (ref 0.7–3.1)
Lymphs: 21 %
MCH: 28 pg (ref 26.6–33.0)
MCHC: 33.5 g/dL (ref 31.5–35.7)
MCV: 84 fL (ref 79–97)
Monocytes Absolute: 0.7 10*3/uL (ref 0.1–0.9)
Monocytes: 9 %
Neutrophils Absolute: 5.8 10*3/uL (ref 1.4–7.0)
Neutrophils: 68 %
Platelets: 283 10*3/uL (ref 150–450)
RBC: 4.5 x10E6/uL (ref 3.77–5.28)
RDW: 14.7 % (ref 11.7–15.4)
WBC: 8.5 10*3/uL (ref 3.4–10.8)

## 2020-12-14 LAB — COMPREHENSIVE METABOLIC PANEL
ALT: 11 IU/L (ref 0–32)
AST: 16 IU/L (ref 0–40)
Albumin/Globulin Ratio: 2 (ref 1.2–2.2)
Albumin: 4.3 g/dL (ref 3.6–4.6)
Alkaline Phosphatase: 33 IU/L — ABNORMAL LOW (ref 44–121)
BUN/Creatinine Ratio: 14 (ref 12–28)
BUN: 14 mg/dL (ref 8–27)
Bilirubin Total: 0.2 mg/dL (ref 0.0–1.2)
CO2: 33 mmol/L — ABNORMAL HIGH (ref 20–29)
Calcium: 9.2 mg/dL (ref 8.7–10.3)
Chloride: 86 mmol/L — ABNORMAL LOW (ref 96–106)
Creatinine, Ser: 0.98 mg/dL (ref 0.57–1.00)
Globulin, Total: 2.1 g/dL (ref 1.5–4.5)
Glucose: 139 mg/dL — ABNORMAL HIGH (ref 65–99)
Potassium: 4 mmol/L (ref 3.5–5.2)
Sodium: 127 mmol/L — ABNORMAL LOW (ref 134–144)
Total Protein: 6.4 g/dL (ref 6.0–8.5)
eGFR: 57 mL/min/{1.73_m2} — ABNORMAL LOW (ref >59)

## 2020-12-14 NOTE — Progress Notes (Signed)
Chief Complaint  Patient presents with   Fatigue    Cannot get out bed, very tired-is eating. Covid test Th and Sat negative. EMS came yesterday and checked all vitals and said they were all normal-they are worried about dehydration. Something is just not right. Daughter had to dress her to get her out of bed. Has bruise on her face and right arm, not sure what she did.    Depression    "I'm weak, don't have any energy"  Patient is accompanied by her daughter Helene Kelp.  Symptoms started Thursday (4 days ago). She has visited her daily, and first noted a bruise on her L cheek on Saturday.  Denies any fall just reported bumping into something.  Denies any syncope or change in headaches, denies dizziness.  Her daughters have noted confusion, some disorientation--that she couldn't remember some of her grandchildren's names.  She has also noted some urinary frequency today.  Denies any pain with urination, blood in the urine.  She notes some "weak bladder ", some  leakage, wears pads, unchanged.  Family is worried about dehydration. Normally she drinks a lot of water, always has a bottle with her when going out.  But since not feeling well, hasn't been drinking much.  She was still in bed today at 1pm when her daughter when to pick her up for this visit. They do report that she has been eating.  Mild HA currently, gets frequently, no changed from usual. She takes some tylenol, and continues to use alka selzer cold plus daily, despite our previous conversation about her headaches.  She denies fever, chills.  EMS came yesterday, and vitals were normal. She has had COVID tests x2, both negative.  She is not aware of having any seizures, and reports compliance with her medications.   PMH, PSH, SH reviewed  Outpatient Encounter Medications as of 12/14/2020  Medication Sig Note   amLODipine (NORVASC) 10 MG tablet Take 10 mg by mouth daily.    Chlorphen-Phenyleph-ASA (ALKA-SELTZER PLUS COLD PO) Take  1 tablet by mouth daily.    levETIRAcetam (KEPPRA) 500 MG tablet TAKE 1 TABLET BY MOUTH TWICE A DAY *NDC [id number]* 12/14/2020: Breaks tablets in half, and gets a total of 2 pills throughout the day (1/2 tablet QID)   acetaminophen (TYLENOL) 325 MG tablet Take 325 mg by mouth every 6 (six) hours as needed. Reported on 09/09/2015 (Patient not taking: Reported on 12/14/2020)    cholecalciferol (VITAMIN D) 1000 UNITS tablet Take 1,000 Units by mouth daily. (Patient not taking: No sig reported) 11/16/2020: Doesn't think she has been taking this   diphenhydramine-acetaminophen (TYLENOL PM) 25-500 MG TABS Take 1 tablet by mouth at bedtime as needed. Takes most nights (Patient not taking: Reported on 12/14/2020)    No facility-administered encounter medications on file as of 12/14/2020.   Allergies  Allergen Reactions   Sulfa Antibiotics Rash   ROS:  no fever, chills, URI symptoms, chest pain, shortness of breath, nausea, vomiting, bowel changes.  +fatigue, generalized weakness, urinary frequency, some confusion per HPI.  No bleeding, rash.  +bruise on face, denies others   PHYSICAL EXAM:  BP 122/60   Pulse 68   Temp 97.7 F (36.5 C) (Tympanic)   Ht 5' (1.524 m)   Wt 104 lb 6.4 oz (47.4 kg)   BMI 20.39 kg/m   Wt Readings from Last 3 Encounters:  12/14/20 104 lb 6.4 oz (47.4 kg)  11/16/20 109 lb 12.8 oz (49.8 kg)  11/18/19 110 lb 3.2  oz (50 kg)   Alert, pleasant female--looks a little "empty"/glassy-eyed. She is alert and walks normal down the hall. She is a little confused, unable to give very good history.  HEENT: conjunctiva and sclera are clear, EOMI yellow-green ecchymosis L cheek, mildly tender. No other swelling or bruising on head Neck: no c-spine tenderness, no lymphadenopathy or thyromegaly Heart: regular rate and rhythm Lungs: clear bilaterally Abdomen: soft, nontender Extremities: no edema Psych: normal mood, affect hygiene and grooming  Urine dip: SG 1.025, trace blood,  otherwise negative   Patient needed to drink 2 large glasses of water prior to being able to give a urine sample.   ASSESSMENT/PLAN:  Urinary frequency - no e/o UTI, appears to be dehydrated. May have some OAB - Plan: POCT Urinalysis DIP (Proadvantage Device)  Weakness - suspect related to dehydration.  Wt is down, urine is concentrated. will check labs to eval other reasons - Plan: CBC with Differential/Platelet, Comprehensive metabolic panel  Confusion - Plan: Levetiracetam level, CBC with Differential/Platelet, Comprehensive metabolic panel, TSH  Medication monitoring encounter - Plan: Levetiracetam level  Seizure disorder (HCC) - Plan: Levetiracetam level  Weight loss - Plan: TSH   Addendum: CBC and c-met not significantly different from prior (chronic low Na, Cl) Also checking TSH and keppra level Encouraged hydration Will f/u in 1-2 weeks, sooner if worse.

## 2020-12-15 ENCOUNTER — Encounter: Payer: Self-pay | Admitting: Family Medicine

## 2020-12-16 ENCOUNTER — Encounter: Payer: Self-pay | Admitting: Family Medicine

## 2020-12-16 DIAGNOSIS — R410 Disorientation, unspecified: Secondary | ICD-10-CM

## 2020-12-16 DIAGNOSIS — R531 Weakness: Secondary | ICD-10-CM

## 2020-12-16 DIAGNOSIS — R634 Abnormal weight loss: Secondary | ICD-10-CM

## 2020-12-16 LAB — COMPREHENSIVE METABOLIC PANEL

## 2020-12-16 LAB — CBC WITH DIFFERENTIAL/PLATELET

## 2020-12-16 LAB — LEVETIRACETAM LEVEL: Levetiracetam Lvl: 37.7 ug/mL (ref 10.0–40.0)

## 2020-12-16 LAB — TSH

## 2020-12-17 ENCOUNTER — Encounter: Payer: Self-pay | Admitting: Family Medicine

## 2020-12-17 ENCOUNTER — Ambulatory Visit (INDEPENDENT_AMBULATORY_CARE_PROVIDER_SITE_OTHER): Payer: Medicare Other | Admitting: Family Medicine

## 2020-12-17 ENCOUNTER — Other Ambulatory Visit: Payer: Self-pay

## 2020-12-17 VITALS — BP 128/70 | HR 80 | Temp 98.0°F | Ht 60.0 in | Wt 106.2 lb

## 2020-12-17 DIAGNOSIS — R5383 Other fatigue: Secondary | ICD-10-CM | POA: Diagnosis not present

## 2020-12-17 DIAGNOSIS — R531 Weakness: Secondary | ICD-10-CM

## 2020-12-17 DIAGNOSIS — E871 Hypo-osmolality and hyponatremia: Secondary | ICD-10-CM | POA: Diagnosis not present

## 2020-12-17 LAB — POCT URINALYSIS DIP (PROADVANTAGE DEVICE)
Bilirubin, UA: NEGATIVE
Glucose, UA: NEGATIVE mg/dL
Ketones, POC UA: NEGATIVE mg/dL
Leukocytes, UA: NEGATIVE
Nitrite, UA: NEGATIVE
Protein Ur, POC: NEGATIVE mg/dL
Specific Gravity, Urine: 1.025
Urobilinogen, Ur: NEGATIVE
pH, UA: 6 (ref 5.0–8.0)

## 2020-12-17 NOTE — Patient Instructions (Signed)
Please drink plenty of fluids (including juice, gatorade, milk, not all just water), such that your urine is very pale yellow or clear.  The darker the color yellow, the  more you need to drink.  Dehydration, Adult Dehydration is a condition in which there is not enough water or other fluids in the body. This happens when a person loses more fluids than he or she takes in. Important organs, such as the kidneys, brain, and heart, cannot function without a proper amount of fluids. Any loss of fluids from the body can lead todehydration. Dehydration can be mild, moderate, or severe. It should be treated right awayto prevent it from becoming severe. What are the causes? Dehydration may be caused by: Conditions that cause loss of water or other fluids, such as diarrhea, vomiting, or sweating or urinating a lot. Not drinking enough fluids, especially when you are ill or doing activities that require a lot of energy. Other illnesses and conditions, such as fever or infection. Certain medicines, such as medicines that remove excess fluid from the body (diuretics). Lack of safe drinking water. Not being able to get enough water and food. What increases the risk? The following factors may make you more likely to develop this condition: Having a long-term (chronic) illness that has not been treated properly, such as diabetes, heart disease, or kidney disease. Being 48 years of age or older. Having a disability. Living in a place that is high in altitude, where thinner, drier air causes more fluid loss. Doing exercises that put stress on your body for a long time (endurance sports). What are the signs or symptoms? Symptoms of dehydration depend on how severe it is. Mild or moderate dehydration Thirst. Dry lips or dry mouth. Dizziness or light-headedness, especially when standing up from a seated position. Muscle cramps. Dark urine. Urine may be the color of tea. Less urine or tears produced than  usual. Headache. Severe dehydration Changes in skin. Your skin may be cold and clammy, blotchy, or pale. Your skin also may not return to normal after being lightly pinched and released. Little or no tears, urine, or sweat. Changes in vital signs, such as rapid breathing and low blood pressure. Your pulse may be weak or may be faster than 100 beats a minute when you are sitting still. Other changes, such as: Feeling very thirsty. Sunken eyes. Cold hands and feet. Confusion. Being very tired (lethargic) or having trouble waking from sleep. Short-term weight loss. Loss of consciousness. How is this diagnosed? This condition is diagnosed based on your symptoms and a physical exam. You mayhave blood and urine tests to help confirm the diagnosis. How is this treated? Treatment for this condition depends on how severe it is. Treatment should be started right away. Do not wait until dehydration becomes severe. Severe dehydration is an emergency and needs to be treated in a hospital. Mild or moderate dehydration can be treated at home. You may be asked to: Drink more fluids. Drink an oral rehydration solution (ORS). This drink helps restore proper amounts of fluids and salts and minerals in the blood (electrolytes). Severe dehydration can be treated: With IV fluids. By correcting abnormal levels of electrolytes. This is often done by giving electrolytes through a tube that is passed through your nose and into your stomach (nasogastric tube, or NG tube). By treating the underlying cause of dehydration. Follow these instructions at home: Oral rehydration solution If told by your health care provider, drink an ORS: Make an ORS by  following instructions on the package. Start by drinking small amounts, about  cup (120 mL) every 5-10 minutes. Slowly increase how much you drink until you have taken the amount recommended by your health care provider. Eating and drinking        Drink enough  clear fluid to keep your urine pale yellow. If you were told to drink an ORS, finish the ORS first and then start slowly drinking other clear fluids. Drink fluids such as: Water. Do not drink only water. Doing that can lead to hyponatremia, which is having too little salt (sodium) in the body. Water from ice chips you suck on. Fruit juice that you have added water to (diluted fruit juice). Low-calorie sports drinks. Eat foods that contain a healthy balance of electrolytes, such as bananas, oranges, potatoes, tomatoes, and spinach. Do not drink alcohol. Avoid the following: Drinks that contain a lot of sugar. These include high-calorie sports drinks, fruit juice that is not diluted, and soda. Caffeine. Foods that are greasy or contain a lot of fat or sugar. General instructions Take over-the-counter and prescription medicines only as told by your health care provider. Do not take sodium tablets. Doing that can lead to having too much sodium in the body (hypernatremia). Return to your normal activities as told by your health care provider. Ask your health care provider what activities are safe for you. Keep all follow-up visits as told by your health care provider. This is important. Contact a health care provider if: You have muscle cramps, pain, or discomfort, such as: Pain in your abdomen and the pain gets worse or stays in one area (localizes). Stiff neck. You have a rash. You are more irritable than usual. You are sleepier or have a harder time waking than usual. You feel weak or dizzy. You feel very thirsty. Get help right away if you have: Any symptoms of severe dehydration. Symptoms of vomiting, such as: You cannot eat or drink without vomiting. Vomiting gets worse or does not go away. Vomit includes blood or green matter (bile). Symptoms that get worse with treatment. A fever. A severe headache. Problems with urination or bowel movements, such as: Diarrhea that gets worse or  does not go away. Blood in your stool (feces). This may cause stool to look black and tarry. Not urinating, or urinating only a small amount of very dark urine, within 6-8 hours. Trouble breathing. These symptoms may represent a serious problem that is an emergency. Do not wait to see if the symptoms will go away. Get medical help right away. Call your local emergency services (911 in the [place] Do not drive yourself to the hospital. Summary Dehydration is a condition in which there is not enough water or other fluids in the body. This happens when a person loses more fluids than he or she takes in. Treatment for this condition depends on how severe it is. Treatment should be started right away. Do not wait until dehydration becomes severe. Drink enough clear fluid to keep your urine pale yellow. If you were told to drink an oral rehydration solution (ORS), finish the ORS first and then start slowly drinking other clear fluids. Take over-the-counter and prescription medicines only as told by your health care provider. Get help right away if you have any symptoms of severe dehydration. This information is not intended to replace advice given to you by your health care provider. Make sure you discuss any questions you have with your healthcare provider. Document Revised: 11/22/2018 Document  Reviewed: 11/22/2018 Elsevier Patient Education  2022 [employer].

## 2020-12-17 NOTE — Progress Notes (Signed)
Chief Complaint  Patient presents with   Fatigue    Still not feeling better, fatigued. Has been drinking plenty of fluids.    Patient presents accompanied by her daughter Helene Kelp, and other daughter Ivin Booty is on speakerphone.  Patient states that she is feeling better--currently hungry (close to lunchtime) Reports eating meals, drinking more water.  She feels a little tired, thinks related to being hungry. Denies any complaints.  Family reports she has been drinking Gatorade (48 ounce bottles daily) 2 days ago, when Helene Kelp went to bring her gatorade, she was dressed, seemed better. Yesterday was back in her robe all day. This scared them, that she could have ups and downs so quickly, and they admit they panicked. "It was unsettling that she got bad again". They state that she is not as confused as at her last visit, but a little more than usual  No n/v/d. Slight headache, daily as per her usual.  She denies dizziness (except needing food now). No urinary symptoms. Wears pads per normal.  They are trying to figure out how to get "extra eyes on her", to check on her frequently--ensuring she is eating/drinking, not letting dirty dishes ile up, getting dressed, etc.  They need to talk to facility to see what is available.   They realize she doesn't need any skilled services. They are concerned about her meds--we have discussed her OTC use of meds in the past (could she be taking excessive tylenol or alka selzer cold meds--pt states no, only once/day, sometimes twice for tylenol). They feel she will be more confused if someone takes the medications away from her to have more control.  PMH, PSH, SH reviewed  BP 128/70   Pulse 80   Temp 98 F (36.7 C) (Tympanic)   Ht 5' (1.524 m)   Wt 106 lb 3.2 oz (48.2 kg)   BMI 20.74 kg/m   Wt Readings from Last 3 Encounters:  12/17/20 106 lb 3.2 oz (48.2 kg)  12/14/20 104 lb 6.4 oz (47.4 kg)  11/16/20 109 lb 12.8 oz (49.8 kg)   Patient is alert,  in good spirits. She appears much better than at last visit. She is speaking normally, answering questions appropriately, doesn't seem confused (though family doesn't agree with some of the answers to my questions). She walked normal down the hall, and is moving quickly/normally. No slowed mentation as at last visit.   Urine today:  SG 1.025, some blood (chronic), neg leuks/nit   ASSESSMENT/PLAN:  Fatigue, unspecified type - intermittent; discussed how this can be variable, and factors contributing--poor sleep, OTC meds. Would worry more with progressive decline - Plan: TSH, POCT Urinalysis DIP (Proadvantage Device)  Hyponatremia - has been chronic.  recheck today. avoid excessive water, may cont gatorate and discussed other fluids - Plan: Basic metabolic panel, POCT Urinalysis DIP (Proadvantage Device)  Weakness - Plan: TSH, POCT Urinalysis DIP (Proadvantage Device)  B-met, TSH Urine--still not well hydrated. Reviewed with family.  Discussed options for med management (they think will make her more confused not to have her meds with her), caregiver, someone who can count meds to verify how much she takes of her OTC meds, folks to check in on her. Daughter reassured.  Discussed that claritin is preferred over [place], that sedating antihistamine isn't recommended.  Pt unwilling to change.    All questions answered.

## 2020-12-18 LAB — BASIC METABOLIC PANEL
BUN/Creatinine Ratio: 14 (ref 12–28)
BUN: 9 mg/dL (ref 8–27)
CO2: 25 mmol/L (ref 20–29)
Calcium: 9.5 mg/dL (ref 8.7–10.3)
Chloride: 90 mmol/L — ABNORMAL LOW (ref 96–106)
Creatinine, Ser: 0.64 mg/dL (ref 0.57–1.00)
Glucose: 113 mg/dL — ABNORMAL HIGH (ref 65–99)
Potassium: 4.8 mmol/L (ref 3.5–5.2)
Sodium: 133 mmol/L — ABNORMAL LOW (ref 134–144)
eGFR: 87 mL/min/{1.73_m2} (ref >59)

## 2020-12-18 LAB — TSH: TSH: 1.13 u[IU]/mL (ref 0.450–4.500)

## 2020-12-30 ENCOUNTER — Other Ambulatory Visit: Payer: Self-pay | Admitting: Cardiology

## 2020-12-30 DIAGNOSIS — I10 Essential (primary) hypertension: Secondary | ICD-10-CM

## 2021-01-14 ENCOUNTER — Ambulatory Visit: Payer: Medicare Other | Admitting: Physician Assistant

## 2021-01-29 ENCOUNTER — Other Ambulatory Visit: Payer: Self-pay | Admitting: Cardiology

## 2021-01-29 DIAGNOSIS — I10 Essential (primary) hypertension: Secondary | ICD-10-CM

## 2021-02-22 ENCOUNTER — Ambulatory Visit: Payer: Medicare Other | Admitting: Physician Assistant

## 2021-04-06 NOTE — Progress Notes (Signed)
Cardiology Office Note:    Date:  04/07/2021   ID:  Alicia [name], DOB May 30, 1933, MRN [id number]  PCP:  [name], MD  Cardiologist:  Olga Millers, MD   Referring MD: [name], MD   Chief Complaint  Patient presents with   Follow-up    Hypertension     History of Present Illness:    Alicia [name] is a 87 y.o. female with a hx of hypertension and mild diastolic Dysfunction.  Echocardiogram for systolic murmur did not show significant aortic stenosis.  She was last seen by Dr. Jens Som on 11/18/2019.  Blood pressure was mildly elevated on that day.  He has since added amlodipine to her regimen.  She presents today for scheduled follow-up.  She is here with Rosey Bath, her daughter.  Due to forgetfulness and dehydration, when I have someone checking in on her twice daily.  It was recently discovered that she was in fact not taking 10 mg amlodipine.  Blood pressure today is 150/80.  I will restart only 5 mg amlodipine.  They will recheck her blood pressure in the afternoons when the aide comes.  It was noted that she is taking copious amounts of Tylenol because she forgets when she takes it during the day.  I did review her CMP from August.  She walks daily and reports no dyspnea or chest pain.  Overall from a cardiovascular standpoint she is doing quite well.   Past Medical History:  Diagnosis Date   Eye exam abnormal 11/21/14   Dr.Hecker-glaucoma suspect, cataracts, hemorrage   GERD (gastroesophageal reflux disease)    Hx of migraines    Hyperlipidemia    Macular degeneration    Osteoporosis    DEXA 03/2010 T-3.2 R hip; declines meds   PUD (peptic ulcer disease) 1980   Restless leg syndrome    Seizure disorder (HCC) 1997   evaluated by Keondria Siever in past   Tinnitus 05/2009   DR BATES--related to change in med/generic.  Resolved   Vitamin D deficiency     Past Surgical History:  Procedure Laterality Date   CATARACT EXTRACTION Bilateral 11/2014, 01/2015   Dr. Elmer Picker    TONSILLECTOMY     TUBAL LIGATION      Current Medications: Current Meds  Medication Sig   acetaminophen (TYLENOL) 325 MG tablet Take 325 mg by mouth every 6 (six) hours as needed. Reported on 09/09/2015   Chlorphen-Phenyleph-ASA (ALKA-SELTZER PLUS COLD PO) Take 1 tablet by mouth daily.   cholecalciferol (VITAMIN D) 1000 UNITS tablet Take 1,000 Units by mouth daily.   diphenhydramine-acetaminophen (TYLENOL PM) 25-500 MG TABS Take 1 tablet by mouth at bedtime as needed. Takes most nights   levETIRAcetam (KEPPRA) 500 MG tablet TAKE 1 TABLET BY MOUTH TWICE A DAY *NDC [id number]*   [DISCONTINUED] amLODipine (NORVASC) 10 MG tablet Take 10 mg by mouth daily.     Allergies:   Sulfa antibiotics   Social History   Socioeconomic History   Marital status: Widowed    Spouse name: Not on file   Number of children: 2   Years of education: Not on file   Highest education level: High school graduate  Occupational History   Occupation: retired (from [employer] education [employer])  Tobacco Use   Smoking status: Never   Smokeless tobacco: Never  Vaping Use   Vaping Use: Never used  Substance and Sexual Activity   Alcohol use: No   Drug use: No   Sexual activity: Not Currently  Other Topics Concern  Not on file  Social History Narrative   Burgess Amor, moved into [place]. Has pull cords in her rooms if she falls (no longer wears bracelet).   Widowed 2009.  1 daughter in [place] (has 3 kids), 1 daughter in [place] (2 children). 5 grandchildren total.  1 Great-grandchild (in [place])   Caffeine- coffee, 1 daily      Updated 10/2020   Social Determinants of Health   Financial Resource Strain: Not on file  Food Insecurity: Not on file  Transportation Needs: Not on file  Physical Activity: Not on file  Stress: Not on file  Social Connections: Not on file     Family History: The patient's family history includes Alzheimer's disease in her sister; Dementia in her father; Heart disease in her brother and  mother; Seizures (age of onset: 9) in her daughter; Thyroid disease in her sister. There is no history of Diabetes or Cancer.  ROS:   Please see the history of present illness.     All other systems reviewed and are negative.  EKGs/Labs/Other Studies Reviewed:    The following studies were reviewed today:  Echo 2017: Study Conclusions   - Left ventricle: The cavity size was normal. There was mild focal    basal hypertrophy of the septum. Systolic function was normal.    The estimated ejection fraction was in the range of 60% to 65%.    Wall motion was normal; there were no regional wall motion    abnormalities. Doppler parameters are consistent with abnormal    left ventricular relaxation (grade 1 diastolic dysfunction).    Doppler parameters are consistent with indeterminate ventricular    filling pressure.  - Aortic valve: Transvalvular velocity was within the normal range.    There was no stenosis. There was no regurgitation.  - Mitral valve: Transvalvular velocity was within the normal range.    There was no evidence for stenosis. There was trivial    regurgitation.  - Right ventricle: The cavity size was normal. Wall thickness was    normal. Systolic function was normal.  - Atrial septum: No defect or patent foramen ovale was identified    by color flow Doppler.  - Tricuspid valve: There was trivial regurgitation.  - Pulmonary arteries: Systolic pressure was within the normal    range. PA peak pressure: 23 mm Hg (S).    EKG:  EKG is ordered today.  The ekg ordered today demonstrates sinus rhythm R 71, septal Q waves  Recent Labs: 12/14/2020: ALT CANCELED; Hemoglobin CANCELED; Platelets CANCELED 12/17/2020: BUN 9; Creatinine, Ser 0.64; Potassium 4.8; Sodium 133; TSH 1.130  Recent Lipid Panel    Component Value Date/Time   CHOL 212 (H) 09/14/2016 0909   TRIG 97 09/14/2016 0909   HDL 95 09/14/2016 0909   CHOLHDL 2.2 09/14/2016 0909   VLDL 19 09/14/2016 0909   LDLCALC  98 09/14/2016 0909    Physical Exam:    VS:  BP (!) 150/80 (BP Location: Left Arm, Patient Position: Sitting, Cuff Size: Normal)    Pulse 71    Ht 5' (1.524 m)    Wt 109 lb (49.4 kg)    BMI 21.29 kg/m     Wt Readings from Last 3 Encounters:  04/07/21 109 lb (49.4 kg)  12/17/20 106 lb 3.2 oz (48.2 kg)  12/14/20 104 lb 6.4 oz (47.4 kg)     GEN: elderly female in NAD, mild confusion, asks same question repeatedly HEENT: Normal NECK: No JVD; No carotid  bruits LYMPHATICS: No lymphadenopathy CARDIAC: RRR, no murmurs, rubs, gallops RESPIRATORY:  Clear to auscultation without rales, wheezing or rhonchi  ABDOMEN: Soft, non-tender, non-distended MUSCULOSKELETAL:  No edema; No deformity  SKIN: Warm and dry NEUROLOGIC:  Alert and oriented x 3, mildly confused PSYCHIATRIC:  Normal affect   ASSESSMENT:    1. Essential hypertension   2. Chronic diastolic heart failure (HCC)    PLAN:    In order of problems listed above:  Hypertension ACEI was D/C'edd and amlodipine was started and increased to 10 mg daily. Today, her daughter Rosey Bath states it was just discovered she is not taking amlodipine at all. Will restart 5 mg daily.  They will keep a blood pressure log, taking blood pressure in the afternoons.  I discussed that given her age and dementia I would not be aggressive in blood pressure control.  If she is in the 130s I think that that is acceptable.   Mild diastolic dysfunction No signs of hypervolemia.   Follow up in 1 year.    Medication Adjustments/Labs and Tests Ordered: Current medicines are reviewed at length with the patient today.  Concerns regarding medicines are outlined above.  Orders Placed This Encounter  Procedures   EKG 12-Lead    Meds ordered this encounter  Medications   amLODipine (NORVASC) 5 MG tablet    Sig: Take 1 tablet (5 mg total) by mouth daily.    Dispense:  90 tablet    Refill:  3    Dose change new Rx     Signed, Marcelino Duster, [place]   04/07/2021 4:40 PM    [facility]

## 2021-04-07 ENCOUNTER — Encounter: Payer: Self-pay | Admitting: Physician Assistant

## 2021-04-07 ENCOUNTER — Other Ambulatory Visit: Payer: Self-pay

## 2021-04-07 ENCOUNTER — Ambulatory Visit (INDEPENDENT_AMBULATORY_CARE_PROVIDER_SITE_OTHER): Payer: Medicare Other | Admitting: Physician Assistant

## 2021-04-07 VITALS — BP 150/80 | HR 71 | Ht 60.0 in | Wt 109.0 lb

## 2021-04-07 DIAGNOSIS — I10 Essential (primary) hypertension: Secondary | ICD-10-CM

## 2021-04-07 DIAGNOSIS — I5032 Chronic diastolic (congestive) heart failure: Secondary | ICD-10-CM | POA: Diagnosis not present

## 2021-04-07 MED ORDER — AMLODIPINE BESYLATE 5 MG PO TABS: 5.0000 mg | ORAL_TABLET | Freq: Every day | ORAL | 3 refills | Status: DC

## 2021-04-07 NOTE — Patient Instructions (Addendum)
Medication Instructions:  DECREASE Amlodipine to 5 mg daily  *If you need a refill on your cardiac medications before your next appointment, please call your pharmacy*  Lab Work: NONE ordered at this time of appointment   If you have labs (blood work) drawn today and your tests are completely normal, you will receive your results only by: MyChart Message (if you have MyChart) OR A paper copy in the mail If you have any lab test that is abnormal or we need to change your treatment, we will call you to review the results.  Testing/Procedures: NONE ordered at this time of appointment   Follow-Up: At [facility], you and your health needs are our priority.  As part of our continuing mission to provide you with exceptional heart care, we have created designated Provider Care Teams.  These Care Teams include your primary Cardiologist (physician) and Advanced Practice Providers (APPs -  Physician Assistants and Nurse Practitioners) who all work together to provide you with the care you need, when you need it.  Your next appointment:   1 year(s)  The format for your next appointment:   In Person  Provider:   Olga Millers, MD    Other Instructions Monitor blood pressure at home. Take blood pressure in the afternoon/evenings

## 2021-04-28 ENCOUNTER — Telehealth: Payer: Self-pay | Admitting: *Deleted

## 2021-04-28 NOTE — Telephone Encounter (Signed)
The following is a list of blood pressures patient was asked to provide,  12/20-139/78 12/21-143/93 12/22-143/84 12/23-149/90 12/26-148/70 12/27-130/73 12/28-140/72 12/29-157/81 12/30-171/89  She was started on amlodipine 5 mg once daily at last office visit. Will forward for dr Jens Som review

## 2021-04-29 MED ORDER — AMLODIPINE BESYLATE 10 MG PO TABS: 10.0000 mg | ORAL_TABLET | Freq: Every day | ORAL | 3 refills | Status: DC

## 2021-04-29 NOTE — Telephone Encounter (Signed)
Left detailed message for daughter teresa of dr Ludwig Clarks recommendations. New script sent to the pharmacy. She is to call with questions.

## 2021-05-28 ENCOUNTER — Encounter: Payer: Self-pay | Admitting: Cardiology

## 2021-05-28 DIAGNOSIS — I10 Essential (primary) hypertension: Secondary | ICD-10-CM

## 2021-05-28 MED ORDER — CHLORTHALIDONE 25 MG PO TABS: 12.5000 mg | ORAL_TABLET | Freq: Every day | ORAL | 3 refills | Status: DC

## 2021-06-10 ENCOUNTER — Encounter: Payer: Self-pay | Admitting: Cardiology

## 2021-07-02 ENCOUNTER — Encounter: Payer: Self-pay | Admitting: Family Medicine

## 2021-07-04 NOTE — Telephone Encounter (Signed)
This will need to be a 30 min visit (they scheduled a 15 min visit themselves). Can you see if the physical after this appt can be moved down 15 mins to accommodate the time I know it will take to discuss her medication overuse, with her family present. ? ?Thanks ?

## 2021-07-08 NOTE — Telephone Encounter (Signed)
Yes please.  Otherwise they refer to the fact that they answered questions and I don't know what they are talking about.  It is a quick glance to make sure there are no surprises ?

## 2021-07-11 NOTE — Progress Notes (Signed)
Chief Complaint  ?Patient presents with  ? Consult  ?  Here for consult [id number] overuse.   ? ? ?Patient presents for evaluation of her liver, accompanied by her daughter Helene Kelp.  Her daughters have been monitoring her Tylenol use, and have noted excessive Tylenol intake. They report taking an average of 30/day. ? ?When she was last seen by me in August, she reported using Tylenol only up to twice/day, and taking Alka selzer cold meds just once daily.  They felt like she was taking it more often, but they were afraid she would be more confused if meds were taken away and handled by someone else.  They felt like she would go out and buy more on her own if she didn't have any at the house. ? ?They have since installed Nest cameras, and have been shocked to find that she is taking over 30 tylenol per day.  She will take doses within 15-60 minutes, occasionally 2 hours between doses.  They have been buying bottles of #100; they took her a bottle on Friday, and they are already finished.  Patient states "somebody else must be taking them". ? ?She states "it is just tylenol", that she takes for headaches.  The headaches are not severe.  They are "tension" headaches per patient, frontal in nature, across forehead. ? ?Helene Kelp and her sister have been talking, and found where to get placebo pills.  They are being delivered tomorrow, and plan to switch them out. ? ?Briefly tried to discuss her cognition.  She previously saw neuro, declined MRI, but was willing to take Namenda (MMSE was 17).  Med was stopped after a few days, made her dizzy. She had no interest in restarting medication or seeing neuro in follow-up.  Daughter still states she won't go back or discuss. ? ? ?PMH, PSH, SH reviewed ? ?Outpatient Encounter Medications as of 07/12/2021  ?Medication Sig Note  ? acetaminophen (TYLENOL) 325 MG tablet Take 325 mg by mouth every 6 (six) hours as needed. Reported on 09/09/2015 07/12/2021: 30 per day(at least)  ? amLODipine  (NORVASC) 10 MG tablet Take 1 tablet (10 mg total) by mouth daily.   ? Chlorphen-Phenyleph-ASA (ALKA-SELTZER PLUS COLD PO) Take 1 tablet by mouth daily. 07/12/2021: 1-2 times daily  ? chlorthalidone (HYGROTON) 25 MG tablet Take 0.5 tablets (12.5 mg total) by mouth daily.   ? diphenhydramine-acetaminophen (TYLENOL PM) 25-500 MG TABS Take 1 tablet by mouth at bedtime as needed. Takes most nights 07/12/2021: 1-2 per night  ? levETIRAcetam (KEPPRA) 500 MG tablet TAKE 1 TABLET BY MOUTH TWICE A DAY *NDC [id number]*   ? cholecalciferol (VITAMIN D) 1000 UNITS tablet Take 1,000 Units by mouth daily. (Patient not taking: Reported on 07/12/2021)   ? ?No facility-administered encounter medications on file as of 07/12/2021.  ? ? ?ROS: no fever, chills, URI symptoms, chest pain, shortness of breath, nausea, vomiting, bowel changes. Denies yellowing of skin or eyes.  ?No bleeding, bruising, rash. ?Daily headaches, per pt. ? ? ? ?PHYSICAL EXAM: ? ?BP 132/70   Pulse 70   Ht 5' (1.524 m)   Wt 112 lb 12.8 oz (51.2 kg)   BMI 22.03 kg/m?  ? ?Wt Readings from Last 3 Encounters:  ?07/12/21 112 lb 12.8 oz (51.2 kg)  ?04/07/21 109 lb (49.4 kg)  ?12/17/20 106 lb 3.2 oz (48.2 kg)  ? ?Elderly female in no distress. She is alert.   ?HEENT: conjunctiva and sclera are clear, anicteric. EOMI ?Neck: no c-spine tenderness, no lymphadenopathy  or thyromegaly, no bruit ?Heart: regular rate and rhythm ?Lungs: clear bilaterally ?Abdomen: soft, nontender, no organomegaly or mass ?Extremities: no edema ?Skin: normal turgor, color, no rashes. ?Neuro: alert, normal gait ?Psych: normal mood, affect hygiene and grooming. She has normal eye contact, speech. ?Daughter is correcting a lot of what patient is saying (minimizing what she takes). ?Daughter is talking to me openly about switching over to placebo pills, in front of patient.  It doesn't seem to phase her, she didn't seem to understand what we were talking about, just sitting there somewhat blankly in  the room. ? ?  ? ?ASSESSMENT/PLAN: ? ?Overuse of medication - Discussed risks of liver dz with overuse of med.  HA seem mild, shouldn't require such quantities. Assess for damage. Trial of placebo per dtrs - Plan: CBC with Differential/Platelet, Comprehensive metabolic panel, Acetaminophen level ? ?Medication monitoring encounter - Plan: CBC with Differential/Platelet, Comprehensive metabolic panel, Acetaminophen level ? ?Moderate dementia, unspecified dementia type, unspecified whether behavioral, psychotic, or mood disturbance or anxiety - pt previously unwilling to take medication for this. May need to get others involved to help with her situation (SW) ? ?C-met, CBC, acetaminophen level. ? ? ?

## 2021-07-12 ENCOUNTER — Encounter: Payer: Self-pay | Admitting: Family Medicine

## 2021-07-12 ENCOUNTER — Other Ambulatory Visit: Payer: Self-pay

## 2021-07-12 ENCOUNTER — Ambulatory Visit (INDEPENDENT_AMBULATORY_CARE_PROVIDER_SITE_OTHER): Payer: Medicare Other | Admitting: Family Medicine

## 2021-07-12 VITALS — BP 132/70 | HR 70 | Ht 60.0 in | Wt 112.8 lb

## 2021-07-12 DIAGNOSIS — Z5181 Encounter for therapeutic drug level monitoring: Secondary | ICD-10-CM

## 2021-07-12 DIAGNOSIS — F03B0 Unspecified dementia, moderate, without behavioral disturbance, psychotic disturbance, mood disturbance, and anxiety: Secondary | ICD-10-CM | POA: Diagnosis not present

## 2021-07-12 DIAGNOSIS — D519 Vitamin B12 deficiency anemia, unspecified: Secondary | ICD-10-CM | POA: Diagnosis not present

## 2021-07-12 DIAGNOSIS — D509 Iron deficiency anemia, unspecified: Secondary | ICD-10-CM

## 2021-07-12 DIAGNOSIS — Z9114 Patient's other noncompliance with medication regimen: Secondary | ICD-10-CM

## 2021-07-12 DIAGNOSIS — T391X1A Poisoning by 4-Aminophenol derivatives, accidental (unintentional), initial encounter: Secondary | ICD-10-CM | POA: Diagnosis not present

## 2021-07-12 DIAGNOSIS — Z91148 Patient's other noncompliance with medication regimen for other reason: Secondary | ICD-10-CM

## 2021-07-12 NOTE — Patient Instructions (Addendum)
The quantity of tylenol taken daily (>30) is very concerning, and high risk for causing significant bodily harm, liver damage. ?We are checking an acetaminophen level, as well as blood counts, and chem panel (including liver tests) ? ?It will be interesting to see if the quantity of medication or any behavior changes when the tylenol is replaced by placebo.  ?if ineffective, will she go out and buy it herself? ? ?We will need to get social workers involved to help maintain her safety if this is the case.  We will need to eliminate this harmful behavior (I hope that the placebo at least temporarily helps!). ? ?If there is any evidence of damage to her liver, we will make sure that we follow up on this.  It will be interesting to see the test results.   ?I would recommend more urgent evaluation (ie ER) if you fine that she is having mental status changes--very lethargic, confused, slurred speech, and bleeding, bruising, abdominal pain, vomiting, blood in bowels. ? ?

## 2021-07-14 ENCOUNTER — Encounter: Payer: Self-pay | Admitting: Family Medicine

## 2021-07-14 LAB — COMPREHENSIVE METABOLIC PANEL
ALT: 8 IU/L (ref 0–32)
AST: 20 IU/L (ref 0–40)
Albumin/Globulin Ratio: 1.7 (ref 1.2–2.2)
Albumin: 4.3 g/dL (ref 3.6–4.6)
Alkaline Phosphatase: 29 IU/L — ABNORMAL LOW (ref 44–121)
BUN/Creatinine Ratio: 18 (ref 12–28)
BUN: 11 mg/dL (ref 8–27)
Bilirubin Total: <0.2 mg/dL (ref 0.0–1.2)
CO2: 27 mmol/L (ref 20–29)
Calcium: 9.2 mg/dL (ref 8.7–10.3)
Chloride: 89 mmol/L — ABNORMAL LOW (ref 96–106)
Creatinine, Ser: 0.6 mg/dL (ref 0.57–1.00)
Globulin, Total: 2.6 g/dL (ref 1.5–4.5)
Glucose: 102 mg/dL — ABNORMAL HIGH (ref 70–99)
Potassium: 5 mmol/L (ref 3.5–5.2)
Sodium: 129 mmol/L — ABNORMAL LOW (ref 134–144)
Total Protein: 6.9 g/dL (ref 6.0–8.5)
eGFR: 88 mL/min/{1.73_m2} (ref >59)

## 2021-07-14 LAB — CBC WITH DIFFERENTIAL/PLATELET
Basophils Absolute: 0 10*3/uL (ref 0.0–0.2)
Basos: 1 %
EOS (ABSOLUTE): 0 10*3/uL (ref 0.0–0.4)
Eos: 0 %
Hematocrit: 32.6 % — ABNORMAL LOW (ref 34.0–46.6)
Hemoglobin: 10.5 g/dL — ABNORMAL LOW (ref 11.1–15.9)
Immature Grans (Abs): 0 10*3/uL (ref 0.0–0.1)
Immature Granulocytes: 0 %
Lymphocytes Absolute: 1.4 10*3/uL (ref 0.7–3.1)
Lymphs: 24 %
MCH: 26.7 pg (ref 26.6–33.0)
MCHC: 32.2 g/dL (ref 31.5–35.7)
MCV: 83 fL (ref 79–97)
Monocytes Absolute: 0.6 10*3/uL (ref 0.1–0.9)
Monocytes: 10 %
Neutrophils Absolute: 3.7 10*3/uL (ref 1.4–7.0)
Neutrophils: 65 %
Platelets: 243 10*3/uL (ref 150–450)
RBC: 3.93 x10E6/uL (ref 3.77–5.28)
RDW: 15.7 % — ABNORMAL HIGH (ref 11.7–15.4)
WBC: 5.8 10*3/uL (ref 3.4–10.8)

## 2021-07-14 LAB — ACETAMINOPHEN LEVEL: Acetaminophen (Tylenol), S: 21 ug/mL (ref 10–30)

## 2021-07-14 LAB — IRON AND TIBC
Iron Saturation: 9 % — CL (ref 15–55)
Iron: 31 ug/dL (ref 27–139)
Total Iron Binding Capacity: 352 ug/dL (ref 250–450)
UIBC: 321 ug/dL (ref 118–369)

## 2021-07-14 LAB — VITAMIN B12: Vitamin B-12: 547 pg/mL (ref 232–1245)

## 2021-07-14 LAB — SPECIMEN STATUS REPORT

## 2021-07-14 LAB — FERRITIN: Ferritin: 13 ng/mL — ABNORMAL LOW (ref 15–150)

## 2021-07-14 NOTE — Addendum Note (Signed)
Addended by: [name] on: 07/14/2021 08:35 AM ? ? Modules accepted: Orders ? ?

## 2021-07-23 ENCOUNTER — Encounter: Payer: Self-pay | Admitting: Nurse Practitioner

## 2021-08-12 ENCOUNTER — Ambulatory Visit: Payer: Medicare Other | Admitting: Nurse Practitioner

## 2021-08-26 ENCOUNTER — Other Ambulatory Visit: Payer: Self-pay | Admitting: Family Medicine

## 2021-08-26 DIAGNOSIS — G40909 Epilepsy, unspecified, not intractable, without status epilepticus: Secondary | ICD-10-CM

## 2021-09-01 ENCOUNTER — Encounter: Payer: Self-pay | Admitting: Family Medicine

## 2021-09-01 NOTE — Telephone Encounter (Signed)
Please check with pharmacy to see if they can get an override to have the prescription filled just a few days early (she has enough until 5/13 I think, due for refill on the 16th), vs call in the short supply that they will have to pay out of pocket for. ? ?Thanks ?

## 2021-11-15 ENCOUNTER — Other Ambulatory Visit: Payer: Self-pay | Admitting: Family Medicine

## 2021-11-15 DIAGNOSIS — G40909 Epilepsy, unspecified, not intractable, without status epilepticus: Secondary | ICD-10-CM

## 2021-11-16 NOTE — Telephone Encounter (Signed)
Looks like she cancelled her visit next week, and didn't r/s until September for AWV. She was last seen and diagnosed with new onset iron deficiency anemia in April, was referred to GI, and cancelled the visit and was never seen by GI. I'm okay with her getting her seizure med refilled, but she needs to come sooner (within the 1-2 weeks) for a med check. I'm very concerned that she hasn't had any further evaluation or follow-up on her new onset iron deficiency anemia.

## 2021-11-16 NOTE — Telephone Encounter (Signed)
[facility] is requesting to fill pt keppra. Please advise KH ?

## 2021-11-17 NOTE — Telephone Encounter (Signed)
Left message for daughter, Shireen Quan to please call me back to get med check appt scheduled in the next 1-2 weeks.

## 2021-11-18 NOTE — Telephone Encounter (Signed)
Spoke with her daughter that is out of town. She is going to call her sister and call tomorrow and get something scheduled for the next week or two for a med check. They only pushed the AWV out further due to the fact tha they are moving her to a memory care facility and needed some more time. Is this okay to fill?

## 2021-11-22 ENCOUNTER — Ambulatory Visit: Payer: Medicare Other | Admitting: Family Medicine

## 2021-11-28 NOTE — Progress Notes (Unsigned)
Chief Complaint  Patient presents with   Medication Refill    [place]- pt will be going to a new facility and has and FL2 form   Patient was asked to schedule a med check when it was noted that she had cancelled 2 appts for AWV Chart was reviewed -- She was last seen in April, accompanied by her daughter Helene Kelp, with concern about excessive Tylenol intake (had cameras in the home, reporting average of 30/day). Patient states she takes Tylenol for headaches--describes them as "tension headaches", frontal (across forehead), and that the headaches aren't severe. They (her daughters) had ordered placebo pills, hadn't received them yet, was planning to swap out the Tylenol. She also reported taking Alka Selzer Cold Plus daily. Based on the reports of excessive tylenol intake, labs were done in April. Acetaminophen level was surprisingly in the normal range. She was found to have a new anemia, with Hg 10.5, low iron saturation, and low ferritin.  My concern was gastritis (vs ulcer or esophagitis) from aspirin from [employer] meds she takes daily. She was advised to stop the [employer] medication, to start taking Prilosec OTC once daily. She was referred to GI.  Appt was made for 4/20, but pt cancelled it, didn't reschedule. At this point, is due for recheck.   Ivin Booty Reports 30 pills/d Given placebo. She was prev taking alka selzer daily (multiplex/day)--now every other day, not as often, not having to buy her more. They never started prilosec OTC. Not giving vitamin D currently.  Other daughter wasn't aware of the GI appt. More confusion with her meds--tylenol was in pill box in place of seizure meds when daughter went Needs supervision of meds  Looking at [facility] and [facility]    Dementia: She previously saw neuro, declined MRI, but was willing to take Namenda (MMSE was 17).  Med was stopped after a few days, made her dizzy. She has no interest in restarting  medication or seeing neuro in follow-up.      09/11/2019    9:28 AM 03/13/2019    8:23 AM  MMSE - Mini Mental State Exam  Orientation to time 2 2  Orientation to Place 2 4  Registration 3 3  Attention/ Calculation 1 1  Recall 2 3  Language- name 2 objects 2 2  Language- repeat 0 0  Language- follow 3 step command 3 3  Language- read & follow direction 1 1  Write a sentence 1 1  Copy design 0 0  Copy design-comments 5 animals   Total score 17 20   Hypertension and diastolic dysfunction (grade 1 per echo 11/2015). She is under the care of Dr. Stanford Breed.  Low dose of lisinopril was stopped when K+ was elevated.  BP's were then noted to be running higher, and amlodipine dose was increased to $RemoveBefo'10mg'gYuCOhLzxnF$ .  This was back in 12/2019.  She last saw cardiologist in 03/2021.  It was noted at that point that she hadn't been taking her amlodipine. BP was up at 150/80. Amlodipine was restarted at $RemoveBefo'5mg'kfKdCoTnZOX$  dose, but ultimately increased back to $Remov'10mg'aVpjTb$  in 04/2021 when BP's remained above goal.  BP's have been running  BP Readings from Last 3 Encounters:  11/29/21 110/60  07/12/21 132/70  04/07/21 (!) 150/80    Seizure disorder: she hasn't had any seizures since being changed to just the Keppra alone (lamictal stopped). Last seizure was probably in 1998. She prefers to only take [employer] generic for Keppra (got ringing in her ears  with other meds in the past).    PHYSICAL EXAM:  BP 110/60   Pulse 81   Wt 111 lb 6.4 oz (50.5 kg)   SpO2 97%   BMI 21.76 kg/m   Wt Readings from Last 3 Encounters:  11/29/21 111 lb 6.4 oz (50.5 kg)  07/12/21 112 lb 12.8 oz (51.2 kg)  04/07/21 109 lb (49.4 kg)      ASSESSMENT/PLAN:   Cbc, c-met,  iron and TIBC, ferritin, Levetiracetam level  F/u as scheduled for AWV 9/13

## 2021-11-29 ENCOUNTER — Ambulatory Visit (INDEPENDENT_AMBULATORY_CARE_PROVIDER_SITE_OTHER): Payer: Medicare Other | Admitting: Family Medicine

## 2021-11-29 ENCOUNTER — Encounter: Payer: Self-pay | Admitting: Family Medicine

## 2021-11-29 VITALS — BP 110/60 | HR 81 | Wt 111.4 lb

## 2021-11-29 DIAGNOSIS — D509 Iron deficiency anemia, unspecified: Secondary | ICD-10-CM | POA: Diagnosis not present

## 2021-11-29 DIAGNOSIS — F03B0 Unspecified dementia, moderate, without behavioral disturbance, psychotic disturbance, mood disturbance, and anxiety: Secondary | ICD-10-CM

## 2021-11-29 DIAGNOSIS — I10 Essential (primary) hypertension: Secondary | ICD-10-CM | POA: Diagnosis not present

## 2021-11-29 DIAGNOSIS — G40909 Epilepsy, unspecified, not intractable, without status epilepticus: Secondary | ICD-10-CM

## 2021-11-29 DIAGNOSIS — E871 Hypo-osmolality and hyponatremia: Secondary | ICD-10-CM | POA: Diagnosis not present

## 2021-11-29 DIAGNOSIS — Z5181 Encounter for therapeutic drug level monitoring: Secondary | ICD-10-CM | POA: Diagnosis not present

## 2021-11-29 NOTE — Patient Instructions (Addendum)
Please stop taking Alka Selzer cold plus. I'd prefer you to take chlortrimeton instead--this is the allergy medication that is IN the alka selzer, but without the aspirin that may be hurting your stomach, and without the decongestant that can raise blood pressure.  If the anemia persists, she will need to see GI doctor for further evaluation. I had wanted her to take prilosec OTC to help heal any inflammation/gastritis.  At this point, rather than getting the medication, let's wait on the labs, and I may send in a prescription strength.  In the Fall, be sure to get high dose flu shot, the updated COVID booster, and the RSV vaccine when it is available.  I will fill out the FL-2 and contact you when it is completed.

## 2021-11-30 LAB — CBC WITH DIFFERENTIAL/PLATELET
Basophils Absolute: 0 10*3/uL (ref 0.0–0.2)
Basos: 1 %
EOS (ABSOLUTE): 0.1 10*3/uL (ref 0.0–0.4)
Eos: 2 %
Hematocrit: 35.7 % (ref 34.0–46.6)
Hemoglobin: 12 g/dL (ref 11.1–15.9)
Immature Grans (Abs): 0 10*3/uL (ref 0.0–0.1)
Immature Granulocytes: 0 %
Lymphocytes Absolute: 1.4 10*3/uL (ref 0.7–3.1)
Lymphs: 21 %
MCH: 26.5 pg — ABNORMAL LOW (ref 26.6–33.0)
MCHC: 33.6 g/dL (ref 31.5–35.7)
MCV: 79 fL (ref 79–97)
Monocytes Absolute: 0.5 10*3/uL (ref 0.1–0.9)
Monocytes: 8 %
Neutrophils Absolute: 4.7 10*3/uL (ref 1.4–7.0)
Neutrophils: 68 %
Platelets: 258 10*3/uL (ref 150–450)
RBC: 4.52 x10E6/uL (ref 3.77–5.28)
RDW: 15.7 % — ABNORMAL HIGH (ref 11.7–15.4)
WBC: 6.8 10*3/uL (ref 3.4–10.8)

## 2021-11-30 LAB — IRON,TIBC AND FERRITIN PANEL
Ferritin: 13 ng/mL — ABNORMAL LOW (ref 15–150)
Iron Saturation: 10 % — ABNORMAL LOW (ref 15–55)
Iron: 34 ug/dL (ref 27–139)
Total Iron Binding Capacity: 336 ug/dL (ref 250–450)
UIBC: 302 ug/dL (ref 118–369)

## 2021-11-30 LAB — COMPREHENSIVE METABOLIC PANEL
ALT: 10 IU/L (ref 0–32)
AST: 17 IU/L (ref 0–40)
Albumin/Globulin Ratio: 1.6 (ref 1.2–2.2)
Albumin: 4.4 g/dL (ref 3.7–4.7)
Alkaline Phosphatase: 40 IU/L — ABNORMAL LOW (ref 44–121)
BUN/Creatinine Ratio: 13 (ref 12–28)
BUN: 11 mg/dL (ref 8–27)
Bilirubin Total: <0.2 mg/dL (ref 0.0–1.2)
CO2: 26 mmol/L (ref 20–29)
Calcium: 9.6 mg/dL (ref 8.7–10.3)
Chloride: 87 mmol/L — ABNORMAL LOW (ref 96–106)
Creatinine, Ser: 0.82 mg/dL (ref 0.57–1.00)
Globulin, Total: 2.7 g/dL (ref 1.5–4.5)
Glucose: 100 mg/dL — ABNORMAL HIGH (ref 70–99)
Potassium: 4.1 mmol/L (ref 3.5–5.2)
Sodium: 128 mmol/L — ABNORMAL LOW (ref 134–144)
Total Protein: 7.1 g/dL (ref 6.0–8.5)
eGFR: 70 mL/min/{1.73_m2} (ref >59)

## 2021-11-30 LAB — LEVETIRACETAM LEVEL: Levetiracetam Lvl: 27.5 ug/mL (ref 10.0–40.0)

## 2021-12-02 ENCOUNTER — Telehealth: Payer: Self-pay | Admitting: Family Medicine

## 2021-12-02 ENCOUNTER — Encounter: Payer: Self-pay | Admitting: Family Medicine

## 2021-12-02 NOTE — Telephone Encounter (Signed)
Please enter order for Quantiferon Gold. This can take quite a few days to get back. She can schedule this NV whenever is convenient

## 2021-12-02 NOTE — Telephone Encounter (Signed)
Pt daughter sharon called and is wanting pt to come in on august the 28th to have a tb blood test states she needs to have this done before moving in the a assisted living home Is possible She can be reached at [phone]

## 2021-12-03 ENCOUNTER — Other Ambulatory Visit: Payer: Self-pay

## 2021-12-03 DIAGNOSIS — Z111 Encounter for screening for respiratory tuberculosis: Secondary | ICD-10-CM

## 2021-12-03 NOTE — Telephone Encounter (Signed)
It appears that you didn't put it in as a future order. Unless she is coming to have this drawn today, it needs to be entered as a future order, and lab visit should be scheduled.

## 2021-12-06 ENCOUNTER — Other Ambulatory Visit: Payer: Self-pay | Admitting: *Deleted

## 2021-12-06 ENCOUNTER — Telehealth: Payer: Self-pay | Admitting: Family Medicine

## 2021-12-06 DIAGNOSIS — Z111 Encounter for screening for respiratory tuberculosis: Secondary | ICD-10-CM

## 2021-12-06 NOTE — Telephone Encounter (Signed)
Left message that FL2 form is ready to be picked up

## 2021-12-06 NOTE — Telephone Encounter (Signed)
Zenon Mayo must not be here.  Please enter future order

## 2021-12-06 NOTE — Telephone Encounter (Signed)
Called patient's daughter to schedule and patient is now on a waiting list for [facility] (memory care) and probably will not need this for 6-8 weeks. She said she will call back once they find out when she will need and they will schedule at that point. I did place future order.

## 2021-12-08 ENCOUNTER — Ambulatory Visit: Payer: Medicare Other | Admitting: Family Medicine

## 2021-12-29 ENCOUNTER — Encounter: Payer: Self-pay | Admitting: Internal Medicine

## 2022-01-04 NOTE — Progress Notes (Deleted)
No chief complaint on file.  Alicia [name] is a 88 y.o. female who presents for annual wellness visit.  She is accompanied by her daughter, Aggie Cosier.   She recently had follow-up on chronic problems.  FL-2 was filled out, with plans to move patient to a place with more assistance with medication, and memory care. (Currently living at [facility]). UPDATE  She was diagnosed with iron deficiency anemia when labs were checked due to concern of her taking excessive tylenol (in March 2023).  She never saw GI as recommended (was referred to GI, and asked dto take Prilosec daily until seen). Recheck of labs at visit last month showed improvement in Hg.  Still had some low iron stores, and daily iron was recommended to take for a couple of months.  She was told to avoid aspirin (getting in her alka selzer cold tablets), and was advised to take pepcid or prilosec on the days she takes alka selzer (once daily).  Lab Results  Component Value Date   WBC 6.8 11/29/2021   HGB 12.0 11/29/2021   HCT 35.7 11/29/2021   MCV 79 11/29/2021   PLT 258 11/29/2021   Lab Results  Component Value Date   IRON 34 11/29/2021   TIBC 336 11/29/2021   FERRITIN 13 (L) 11/29/2021    Not discussed at her recent visit was her diagnosis of Osteoporosis.  She has declined treatments for osteoporosis in the past, refusing to take any additional medications. She takes Vitamin D daily. She gets no weight-bearing exercise. She does  not take calcium supplements  UPDATE CA INTAKE  Last DEXA was over 10 years ago, T-2.7 at hip from 2002 DEXA.     Immunization History  Administered Date(s) Administered   Fluad Quad(high Dose 65+) 12/28/2018, 01/17/2020   Influenza Split 03/01/2012   Influenza, High Dose Seasonal PF 01/19/2014, 01/19/2015, 12/30/2015, 12/28/2016, 01/11/2018   Influenza-Unspecified 12/24/2012, 12/28/2016   PFIZER(Purple Top)SARS-COV-2 Vaccination 05/15/2019, 06/05/2019, 03/02/2020   Pneumococcal  Conjugate-13 08/13/2014   Pneumococcal Polysaccharide-23 04/26/1999, 09/01/2006, 09/14/2016   Td 09/01/2006   Tdap 07/25/2011   Zoster, Live 09/10/2015   Last Pap smear: 07/2014, normal, with no high risk HPV present Last mammogram:  12/2013, no longer wants to get mammograms. Last colonoscopy: Per Dr. Milinda Pointer first note at [facility], said 2 years prior (so would have been approx 2006); negative Cologard 08/2015. Referred for repeat Cologuard in 09/2018, but not done, declined further screening.  Last DEXA: 03/2010 (result not available; T-2.7 at hip from 2002 DEXA). Not repeated as she declines any treatment Ophtho: hasn't gone regularly since her cataract surgery Dentist: 2x/year.  She has had extractions, has temporary partial denture currently, had lots of fillings Exercise:  walks most days (3 laps around the building, outside weather permitting, for maybe 30 minutes).   Lipids: Lab Results  Component Value Date   CHOL 212 (H) 09/14/2016   HDL 95 09/14/2016   LDLCALC 98 09/14/2016   TRIG 97 09/14/2016   CHOLHDL 2.2 09/14/2016    Vitamin D level 07/2011: 59.    Patient Care Team: [name], MD as PCP - General (Family Medicine) Jens Som Madolyn Frieze, MD as PCP - Cardiology (Cardiology) Dentist:  Dr. Michaell Cowing ophtho--Dr. Frederico Hamman at [facility] for glasses, Dr. Elmer Picker for cataracts Neuro: Dr. Marjory Lies (no longer sees neuro)    Depression Screening: Flowsheet Row Office Visit from 11/16/2020 in [facility]  PHQ-2 Total Score 0         Falls screen:  11/16/2020   10:12 AM 10/24/2019   10:25 AM 03/20/2019    9:51 AM 03/13/2019    8:17 AM 10/17/2018    9:52 AM  Fall Risk   Falls in the past year? 0 0 0 0 0  Comment   Emmi Telephone Survey: data to providers prior to load    Number falls in past yr: 0      Injury with Fall? 0      Risk for fall due to : No Fall Risks      Follow up Falls evaluation completed         Functional Status Survey:           Mini-cog screen: Score of 3/5. She had 3/3 recall.  Clock drawing--missing one hand; numbers in correct positions   End of Life Discussion:  Patient has a living will and medical power of attorney, scanned    PMH, PSH, SH and FH were reviewed         ROS: The patient denies anorexia, fever, weight changes, seizures (none for years, compliant with meds), vision changes, ear pain, sore throat, breast concerns, chest pain, palpitations, syncope, dyspnea on exertion, swelling, nausea, vomiting, diarrhea, constipation, abdominal pain, melena, hematochezia, vaginal bleeding, discharge, odor or itch, genital lesions, numbness, tingling, weakness, tremor, suspicious skin lesions, depression, abnormal bleeding/bruising, or enlarged lymph nodes.    Tylenol PM is needed for sleep every night. +urinary incontinence (chronic), wears pads.  Denies dysuria; hematuria Some allergies, with PND and slight wet cough--uses alka selzer plus daily. It helps, but she has frequent frontal headaches for which she takes tylenol throughout the day. Some leg pain at night, behind her knees.  Theragesic helps. Infrequent occ constipation Memory issues, unchanged.     PHYSICAL EXAM:   There were no vitals taken for this visit.  Wt Readings from Last 3 Encounters:  11/29/21 111 lb 6.4 oz (50.5 kg)  07/12/21 112 lb 12.8 oz (51.2 kg)  04/07/21 109 lb (49.4 kg)   General Appearance:    Alert, cooperative, no distress, appears stated age  Head:    Normocephalic, without obvious abnormality, atraumatic   Eyes:    PERRL, conjunctiva/corneas clear, EOM's intact, fundi benign     Ears:    Normal TM's and external ear canals     Nose:    Nasal mucosa with mild edema, no no purulence. Sinuses are nontender  Throat:    Normal, no lesions  Neck:    Supple, no lymphadenopathy; thyroid: no enlargement/ tenderness/nodules; no carotid bruit or JVD     Back:    Spine nontender, no curvature, ROM normal, no CVA tenderness   Lungs:    Clear to auscultation bilaterally without wheezes, rales or ronchi; respirations unlabored     Chest Wall:    No tenderness or deformity     Heart:    Regular rate and rhythm, S1 and S2 normal, no rub or gallop; no murmur appreciable today  Breast Exam:    No tenderness, masses, or nipple discharge or inversion. No axillary lymphadenopathy     Abdomen:    Soft, non-tender, nondistended, normoactive bowel sounds, no masses, no hepatosplenomegaly     Genitalia:    Pt declined exam  Rectal:    Pt declined exam  Extremities:    No clubbing, cyanosis or edema     Pulses:    2+ and symmetric all extremities.   Skin:    Skin color, texture, turgor normal, no rashes  or lesions. Small purpura on upper forearms    Lymph    nodes:    Cervical, supraclavicular, and axillary nodes normal     Neurologic:    Normal strength, sensation and gait; reflexes 2+ and symmetric throughout                 Psych:   Normal mood, affect, hygiene and grooming   ***UPDATE--purpura on upper forearms Breast exam?   ASSESSMENT/PLAN:   Need flu shot and COVID booster (likely got from [place]) from last year. High dose flu shot today?? Has she gotten TdaP or Shingrix?   Put in ticket--the documents attached to the 2011 DEXA and mammogram are NOT her DEXA and mammogram (just financial policy) No need to chase down the 2011 [employer] DEXA since pt doesn't want treatment,   Risks of untreated osteoporosis reviewed, declines treatment or f/u DEXA Risks/benefits of mammograms reviewed. She no longer wants mammograms, understands the risk of finding cancer later (only when palpable), which could have a worse prognosis than if found earlier on mammo. She understands and declines.  Recommended at least 30 minutes of aerobic activity at least 5 days/week  and weight-bearing exercise 2x/week; proper sunscreen use reviewed; healthy diet, including goals of calcium and vitamin D intake and alcohol recommendations  (less than or equal to 1 drink/day) reviewed; regular seatbelt use.  Immunization recommendations discussed--continue yearly high dose flu shots. RSV vaccine recommended, to get from the pharmacy. TdaP is due, to get from the pharmacy. Shingrix recommended, to get from pharmacy. Colon cancer screening-- declines further screening (reasonable given age), advised to let us know if any change in bowel habits, abdominal pain or other concerns.     F/u 1 year, sooner prn.     Medicare Attestation I have personally reviewed: The patient's medical and social history Their use of alcohol, tobacco or illicit drugs Their current medications and supplements The patient's functional ability including ADLs,fall risks, home safety risks, cognitive, and hearing and visual impairment Diet and physical activities Evidence for depression or mood disorders   The patient's weight, height, BMI have been recorded in the chart.  I have made referrals, counseling, and provided education to the patient based on review of the above and I have provided the patient with a written personalized care plan for preventive services.

## 2022-01-05 ENCOUNTER — Telehealth: Payer: Self-pay | Admitting: *Deleted

## 2022-01-05 ENCOUNTER — Ambulatory Visit: Payer: Medicare Other | Admitting: Family Medicine

## 2022-01-05 DIAGNOSIS — D692 Other nonthrombocytopenic purpura: Secondary | ICD-10-CM

## 2022-01-05 DIAGNOSIS — Z0000 Encounter for general adult medical examination without abnormal findings: Secondary | ICD-10-CM

## 2022-01-05 DIAGNOSIS — D509 Iron deficiency anemia, unspecified: Secondary | ICD-10-CM

## 2022-01-05 DIAGNOSIS — M810 Age-related osteoporosis without current pathological fracture: Secondary | ICD-10-CM

## 2022-01-05 NOTE — Telephone Encounter (Signed)
Dr. Lynelle Doctor would like a same day cancellation letter please, thanks.

## 2022-01-11 ENCOUNTER — Encounter: Payer: Self-pay | Admitting: Family Medicine

## 2022-01-11 NOTE — Telephone Encounter (Signed)
Letter sent.

## 2022-01-30 ENCOUNTER — Encounter: Payer: Self-pay | Admitting: Family Medicine

## 2022-01-31 NOTE — Telephone Encounter (Signed)
She has orders in the system from August to get quantiferon--doesn't look like she came to get that. Needs lab visit. She also cancelled 12/2021 AWV (I think there was a transportation issue, if I recall correctly), hasn't been rescheduled, and needs to be.  FL-2 completed and in red folder

## 2022-02-01 ENCOUNTER — Encounter: Payer: Self-pay | Admitting: Internal Medicine

## 2022-02-02 ENCOUNTER — Other Ambulatory Visit: Payer: Medicare Other

## 2022-02-02 DIAGNOSIS — Z111 Encounter for screening for respiratory tuberculosis: Secondary | ICD-10-CM | POA: Diagnosis not present

## 2022-02-05 LAB — QUANTIFERON-TB GOLD PLUS
QuantiFERON Mitogen Value: >10 IU/mL
QuantiFERON Nil Value: 0.03 IU/mL
QuantiFERON TB1 Ag Value: 0.04 IU/mL
QuantiFERON TB2 Ag Value: 0.05 IU/mL
QuantiFERON-TB Gold Plus: NEGATIVE

## 2022-02-07 ENCOUNTER — Telehealth: Payer: Self-pay | Admitting: Family Medicine

## 2022-02-07 NOTE — Telephone Encounter (Signed)
Copy of Quantiferon results were faxed to [facility] at [facility]. Fax number given was [phone]

## 2022-02-08 ENCOUNTER — Other Ambulatory Visit: Payer: Self-pay | Admitting: *Deleted

## 2022-02-10 ENCOUNTER — Encounter: Payer: Self-pay | Admitting: Family Medicine

## 2022-02-21 DIAGNOSIS — G40909 Epilepsy, unspecified, not intractable, without status epilepticus: Secondary | ICD-10-CM | POA: Diagnosis not present

## 2022-02-21 DIAGNOSIS — E559 Vitamin D deficiency, unspecified: Secondary | ICD-10-CM | POA: Diagnosis not present

## 2022-02-21 DIAGNOSIS — F0390 Unspecified dementia without behavioral disturbance: Secondary | ICD-10-CM | POA: Diagnosis not present

## 2022-02-21 DIAGNOSIS — I10 Essential (primary) hypertension: Secondary | ICD-10-CM | POA: Diagnosis not present

## 2022-02-21 DIAGNOSIS — J309 Allergic rhinitis, unspecified: Secondary | ICD-10-CM | POA: Diagnosis not present

## 2022-02-22 DIAGNOSIS — F330 Major depressive disorder, recurrent, mild: Secondary | ICD-10-CM | POA: Diagnosis not present

## 2022-02-22 DIAGNOSIS — F0390 Unspecified dementia without behavioral disturbance: Secondary | ICD-10-CM | POA: Diagnosis not present

## 2022-02-22 DIAGNOSIS — F419 Anxiety disorder, unspecified: Secondary | ICD-10-CM | POA: Diagnosis not present

## 2022-03-08 DIAGNOSIS — F419 Anxiety disorder, unspecified: Secondary | ICD-10-CM | POA: Diagnosis not present

## 2022-03-08 DIAGNOSIS — F330 Major depressive disorder, recurrent, mild: Secondary | ICD-10-CM | POA: Diagnosis not present

## 2022-03-08 DIAGNOSIS — F0390 Unspecified dementia without behavioral disturbance: Secondary | ICD-10-CM | POA: Diagnosis not present

## 2022-03-09 ENCOUNTER — Telehealth: Payer: Self-pay | Admitting: Family Medicine

## 2022-03-09 NOTE — Telephone Encounter (Signed)
Datafied records request forwarded to [facility].

## 2022-03-10 ENCOUNTER — Encounter: Payer: Self-pay | Admitting: Family Medicine

## 2022-03-21 DIAGNOSIS — E559 Vitamin D deficiency, unspecified: Secondary | ICD-10-CM | POA: Diagnosis not present

## 2022-03-21 DIAGNOSIS — F0390 Unspecified dementia without behavioral disturbance: Secondary | ICD-10-CM | POA: Diagnosis not present

## 2022-03-21 DIAGNOSIS — G40909 Epilepsy, unspecified, not intractable, without status epilepticus: Secondary | ICD-10-CM | POA: Diagnosis not present

## 2022-03-21 DIAGNOSIS — J309 Allergic rhinitis, unspecified: Secondary | ICD-10-CM | POA: Diagnosis not present

## 2022-03-21 DIAGNOSIS — I10 Essential (primary) hypertension: Secondary | ICD-10-CM | POA: Diagnosis not present

## 2022-03-22 ENCOUNTER — Encounter: Payer: Self-pay | Admitting: Family Medicine

## 2022-03-22 DIAGNOSIS — F330 Major depressive disorder, recurrent, mild: Secondary | ICD-10-CM | POA: Diagnosis not present

## 2022-03-22 DIAGNOSIS — G40909 Epilepsy, unspecified, not intractable, without status epilepticus: Secondary | ICD-10-CM | POA: Diagnosis not present

## 2022-03-22 DIAGNOSIS — M62541 Muscle wasting and atrophy, not elsewhere classified, right hand: Secondary | ICD-10-CM | POA: Diagnosis not present

## 2022-03-22 DIAGNOSIS — R2689 Other abnormalities of gait and mobility: Secondary | ICD-10-CM | POA: Diagnosis not present

## 2022-03-22 DIAGNOSIS — R488 Other symbolic dysfunctions: Secondary | ICD-10-CM | POA: Diagnosis not present

## 2022-03-22 DIAGNOSIS — M62542 Muscle wasting and atrophy, not elsewhere classified, left hand: Secondary | ICD-10-CM | POA: Diagnosis not present

## 2022-03-22 DIAGNOSIS — M62551 Muscle wasting and atrophy, not elsewhere classified, right thigh: Secondary | ICD-10-CM | POA: Diagnosis not present

## 2022-03-22 DIAGNOSIS — M62512 Muscle wasting and atrophy, not elsewhere classified, left shoulder: Secondary | ICD-10-CM | POA: Diagnosis not present

## 2022-03-22 DIAGNOSIS — F0390 Unspecified dementia without behavioral disturbance: Secondary | ICD-10-CM | POA: Diagnosis not present

## 2022-03-22 DIAGNOSIS — M62552 Muscle wasting and atrophy, not elsewhere classified, left thigh: Secondary | ICD-10-CM | POA: Diagnosis not present

## 2022-03-22 DIAGNOSIS — F419 Anxiety disorder, unspecified: Secondary | ICD-10-CM | POA: Diagnosis not present

## 2022-03-22 DIAGNOSIS — M62511 Muscle wasting and atrophy, not elsewhere classified, right shoulder: Secondary | ICD-10-CM | POA: Diagnosis not present

## 2022-03-23 DIAGNOSIS — M62551 Muscle wasting and atrophy, not elsewhere classified, right thigh: Secondary | ICD-10-CM | POA: Diagnosis not present

## 2022-03-23 DIAGNOSIS — R2689 Other abnormalities of gait and mobility: Secondary | ICD-10-CM | POA: Diagnosis not present

## 2022-03-23 DIAGNOSIS — M62511 Muscle wasting and atrophy, not elsewhere classified, right shoulder: Secondary | ICD-10-CM | POA: Diagnosis not present

## 2022-03-23 DIAGNOSIS — M62552 Muscle wasting and atrophy, not elsewhere classified, left thigh: Secondary | ICD-10-CM | POA: Diagnosis not present

## 2022-03-23 DIAGNOSIS — M62512 Muscle wasting and atrophy, not elsewhere classified, left shoulder: Secondary | ICD-10-CM | POA: Diagnosis not present

## 2022-03-23 DIAGNOSIS — R488 Other symbolic dysfunctions: Secondary | ICD-10-CM | POA: Diagnosis not present

## 2022-03-24 DIAGNOSIS — M79675 Pain in left toe(s): Secondary | ICD-10-CM | POA: Diagnosis not present

## 2022-03-24 DIAGNOSIS — M79674 Pain in right toe(s): Secondary | ICD-10-CM | POA: Diagnosis not present

## 2022-03-24 DIAGNOSIS — B351 Tinea unguium: Secondary | ICD-10-CM | POA: Diagnosis not present

## 2022-03-25 DIAGNOSIS — R2689 Other abnormalities of gait and mobility: Secondary | ICD-10-CM | POA: Diagnosis not present

## 2022-03-25 DIAGNOSIS — M62512 Muscle wasting and atrophy, not elsewhere classified, left shoulder: Secondary | ICD-10-CM | POA: Diagnosis not present

## 2022-03-25 DIAGNOSIS — M62542 Muscle wasting and atrophy, not elsewhere classified, left hand: Secondary | ICD-10-CM | POA: Diagnosis not present

## 2022-03-25 DIAGNOSIS — M62551 Muscle wasting and atrophy, not elsewhere classified, right thigh: Secondary | ICD-10-CM | POA: Diagnosis not present

## 2022-03-25 DIAGNOSIS — R488 Other symbolic dysfunctions: Secondary | ICD-10-CM | POA: Diagnosis not present

## 2022-03-25 DIAGNOSIS — M62541 Muscle wasting and atrophy, not elsewhere classified, right hand: Secondary | ICD-10-CM | POA: Diagnosis not present

## 2022-03-25 DIAGNOSIS — M62511 Muscle wasting and atrophy, not elsewhere classified, right shoulder: Secondary | ICD-10-CM | POA: Diagnosis not present

## 2022-03-25 DIAGNOSIS — M62552 Muscle wasting and atrophy, not elsewhere classified, left thigh: Secondary | ICD-10-CM | POA: Diagnosis not present

## 2022-03-28 DIAGNOSIS — M62512 Muscle wasting and atrophy, not elsewhere classified, left shoulder: Secondary | ICD-10-CM | POA: Diagnosis not present

## 2022-03-28 DIAGNOSIS — M62511 Muscle wasting and atrophy, not elsewhere classified, right shoulder: Secondary | ICD-10-CM | POA: Diagnosis not present

## 2022-03-28 DIAGNOSIS — R2689 Other abnormalities of gait and mobility: Secondary | ICD-10-CM | POA: Diagnosis not present

## 2022-03-28 DIAGNOSIS — M62551 Muscle wasting and atrophy, not elsewhere classified, right thigh: Secondary | ICD-10-CM | POA: Diagnosis not present

## 2022-03-28 DIAGNOSIS — R488 Other symbolic dysfunctions: Secondary | ICD-10-CM | POA: Diagnosis not present

## 2022-03-28 DIAGNOSIS — M62552 Muscle wasting and atrophy, not elsewhere classified, left thigh: Secondary | ICD-10-CM | POA: Diagnosis not present

## 2022-03-29 DIAGNOSIS — M62512 Muscle wasting and atrophy, not elsewhere classified, left shoulder: Secondary | ICD-10-CM | POA: Diagnosis not present

## 2022-03-29 DIAGNOSIS — M62511 Muscle wasting and atrophy, not elsewhere classified, right shoulder: Secondary | ICD-10-CM | POA: Diagnosis not present

## 2022-03-29 DIAGNOSIS — M62552 Muscle wasting and atrophy, not elsewhere classified, left thigh: Secondary | ICD-10-CM | POA: Diagnosis not present

## 2022-03-29 DIAGNOSIS — R488 Other symbolic dysfunctions: Secondary | ICD-10-CM | POA: Diagnosis not present

## 2022-03-29 DIAGNOSIS — R2689 Other abnormalities of gait and mobility: Secondary | ICD-10-CM | POA: Diagnosis not present

## 2022-03-29 DIAGNOSIS — M62551 Muscle wasting and atrophy, not elsewhere classified, right thigh: Secondary | ICD-10-CM | POA: Diagnosis not present

## 2022-03-29 NOTE — Progress Notes (Unsigned)
No chief complaint on file.  Patient presents for repeat cognitive testing. This is needed to try and get her long-term care insurance to cover things. Apparently the prior MMSE was too long ago (and not indicating severe dementia). She moved to [facility] in October.     09/11/2019    9:28 AM 03/13/2019    8:23 AM  MMSE - Mini Mental State Exam  Orientation to time 2 2  Orientation to Place 2 4  Registration 3 3  Attention/ Calculation 1 1  Recall 2 3  Language- name 2 objects 2 2  Language- repeat 0 0  Language- follow 3 step command 3 3  Language- read & follow direction 1 1  Write a sentence 1 1  Copy design 0 0  Copy design-comments 5 animals   Total score 17 20    PMH, PSH, SH reviewed   ROS:   PHYSICAL EXAM:  There were no vitals taken for this visit.  Wt Readings from Last 3 Encounters:  11/29/21 111 lb 6.4 oz (50.5 kg)  07/12/21 112 lb 12.8 oz (51.2 kg)  04/07/21 109 lb (49.4 kg)      ASSESSMENT/PLAN:  MMSE  Did she get flu or COVID booster?  Offer/decline Also can have Prevnar-20 if not taking the other 2 (can have with flu shot) Needs to get Tdap from pharmacy RSV rec from pharmacy Shingrix rec from pharmacy  See if she has had any of these?  We have previously discussed.

## 2022-03-30 ENCOUNTER — Encounter: Payer: Self-pay | Admitting: Family Medicine

## 2022-03-30 ENCOUNTER — Ambulatory Visit (INDEPENDENT_AMBULATORY_CARE_PROVIDER_SITE_OTHER): Payer: Medicare Other | Admitting: Family Medicine

## 2022-03-30 VITALS — BP 110/60 | HR 68 | Ht 60.0 in | Wt 113.8 lb

## 2022-03-30 DIAGNOSIS — I10 Essential (primary) hypertension: Secondary | ICD-10-CM | POA: Diagnosis not present

## 2022-03-30 DIAGNOSIS — F0390 Unspecified dementia without behavioral disturbance: Secondary | ICD-10-CM | POA: Diagnosis not present

## 2022-03-30 DIAGNOSIS — Z7185 Encounter for immunization safety counseling: Secondary | ICD-10-CM

## 2022-03-30 DIAGNOSIS — G40909 Epilepsy, unspecified, not intractable, without status epilepticus: Secondary | ICD-10-CM

## 2022-03-30 NOTE — Patient Instructions (Addendum)
If possible, verify that you have had your flu and COVID booster. RSV vaccine is recommended (this is usually gotten from the pharmacy, covered by Medicare Part D). Tetanus booster (TdaP) is also due, as it has been 10 years since your last one.  Shingles vaccine (shingrix) is a series of 2 vaccines given 2 months apart.  This also is covered by Medicare part D and comes from the pharmacy.  At some point you may also want to get the new Prevnar-20 (newer pneumonia vaccine).   Immunization History  Administered Date(s) Administered   Fluad Quad(high Dose 65+) 12/28/2018, 01/17/2020   Influenza Split 03/01/2012   Influenza, High Dose Seasonal PF 01/19/2014, 01/19/2015, 12/30/2015, 12/28/2016, 01/11/2018   Influenza-Unspecified 12/24/2012, 12/28/2016   PFIZER(Purple Top)SARS-COV-2 Vaccination 05/15/2019, 06/05/2019, 03/02/2020   Pneumococcal Conjugate-13 08/13/2014   Pneumococcal Polysaccharide-23 04/26/1999, 09/01/2006, 09/14/2016   Td 09/01/2006   Tdap 07/25/2011   Zoster, Live 09/10/2015   There has been some low levels of iron stores (?related to prior alka selzer use). This should be rechecked with routine labs in March.     03/30/2022    3:37 PM 09/11/2019    9:28 AM 03/13/2019    8:23 AM  MMSE - Mini Mental State Exam  Orientation to time 1 2 2   Orientation to Place 1 2 4   Registration 3 3 3   Attention/ Calculation 0 1 1  Recall 0 2 3  Language- name 2 objects 2 2 2   Language- repeat 0 0 0  Language- follow 3 step command 3 3 3   Language- read & follow direction 1 1 1   Write a sentence 0 1 1  Copy design 0 0 0  Copy design-comments  5 animals   Total score 11 17 20

## 2022-03-31 ENCOUNTER — Encounter: Payer: Self-pay | Admitting: Family Medicine

## 2022-03-31 DIAGNOSIS — M62512 Muscle wasting and atrophy, not elsewhere classified, left shoulder: Secondary | ICD-10-CM | POA: Diagnosis not present

## 2022-03-31 DIAGNOSIS — M62551 Muscle wasting and atrophy, not elsewhere classified, right thigh: Secondary | ICD-10-CM | POA: Diagnosis not present

## 2022-03-31 DIAGNOSIS — G40909 Epilepsy, unspecified, not intractable, without status epilepticus: Secondary | ICD-10-CM | POA: Diagnosis not present

## 2022-03-31 DIAGNOSIS — M62511 Muscle wasting and atrophy, not elsewhere classified, right shoulder: Secondary | ICD-10-CM | POA: Diagnosis not present

## 2022-03-31 DIAGNOSIS — E559 Vitamin D deficiency, unspecified: Secondary | ICD-10-CM | POA: Diagnosis not present

## 2022-03-31 DIAGNOSIS — R488 Other symbolic dysfunctions: Secondary | ICD-10-CM | POA: Diagnosis not present

## 2022-03-31 DIAGNOSIS — R2689 Other abnormalities of gait and mobility: Secondary | ICD-10-CM | POA: Diagnosis not present

## 2022-03-31 DIAGNOSIS — I10 Essential (primary) hypertension: Secondary | ICD-10-CM | POA: Diagnosis not present

## 2022-03-31 DIAGNOSIS — M62552 Muscle wasting and atrophy, not elsewhere classified, left thigh: Secondary | ICD-10-CM | POA: Diagnosis not present

## 2022-04-01 DIAGNOSIS — M62551 Muscle wasting and atrophy, not elsewhere classified, right thigh: Secondary | ICD-10-CM | POA: Diagnosis not present

## 2022-04-01 DIAGNOSIS — R488 Other symbolic dysfunctions: Secondary | ICD-10-CM | POA: Diagnosis not present

## 2022-04-01 DIAGNOSIS — M62512 Muscle wasting and atrophy, not elsewhere classified, left shoulder: Secondary | ICD-10-CM | POA: Diagnosis not present

## 2022-04-01 DIAGNOSIS — M62552 Muscle wasting and atrophy, not elsewhere classified, left thigh: Secondary | ICD-10-CM | POA: Diagnosis not present

## 2022-04-01 DIAGNOSIS — R2689 Other abnormalities of gait and mobility: Secondary | ICD-10-CM | POA: Diagnosis not present

## 2022-04-01 DIAGNOSIS — M62511 Muscle wasting and atrophy, not elsewhere classified, right shoulder: Secondary | ICD-10-CM | POA: Diagnosis not present

## 2022-04-04 DIAGNOSIS — M62512 Muscle wasting and atrophy, not elsewhere classified, left shoulder: Secondary | ICD-10-CM | POA: Diagnosis not present

## 2022-04-04 DIAGNOSIS — M62552 Muscle wasting and atrophy, not elsewhere classified, left thigh: Secondary | ICD-10-CM | POA: Diagnosis not present

## 2022-04-04 DIAGNOSIS — R2689 Other abnormalities of gait and mobility: Secondary | ICD-10-CM | POA: Diagnosis not present

## 2022-04-04 DIAGNOSIS — R488 Other symbolic dysfunctions: Secondary | ICD-10-CM | POA: Diagnosis not present

## 2022-04-04 DIAGNOSIS — M62551 Muscle wasting and atrophy, not elsewhere classified, right thigh: Secondary | ICD-10-CM | POA: Diagnosis not present

## 2022-04-04 DIAGNOSIS — M62511 Muscle wasting and atrophy, not elsewhere classified, right shoulder: Secondary | ICD-10-CM | POA: Diagnosis not present

## 2022-04-05 DIAGNOSIS — R2689 Other abnormalities of gait and mobility: Secondary | ICD-10-CM | POA: Diagnosis not present

## 2022-04-05 DIAGNOSIS — M62551 Muscle wasting and atrophy, not elsewhere classified, right thigh: Secondary | ICD-10-CM | POA: Diagnosis not present

## 2022-04-05 DIAGNOSIS — M62511 Muscle wasting and atrophy, not elsewhere classified, right shoulder: Secondary | ICD-10-CM | POA: Diagnosis not present

## 2022-04-05 DIAGNOSIS — M62552 Muscle wasting and atrophy, not elsewhere classified, left thigh: Secondary | ICD-10-CM | POA: Diagnosis not present

## 2022-04-05 DIAGNOSIS — M62512 Muscle wasting and atrophy, not elsewhere classified, left shoulder: Secondary | ICD-10-CM | POA: Diagnosis not present

## 2022-04-05 DIAGNOSIS — R488 Other symbolic dysfunctions: Secondary | ICD-10-CM | POA: Diagnosis not present

## 2022-04-07 ENCOUNTER — Telehealth: Payer: Self-pay | Admitting: Family Medicine

## 2022-04-07 DIAGNOSIS — M62552 Muscle wasting and atrophy, not elsewhere classified, left thigh: Secondary | ICD-10-CM | POA: Diagnosis not present

## 2022-04-07 DIAGNOSIS — M62551 Muscle wasting and atrophy, not elsewhere classified, right thigh: Secondary | ICD-10-CM | POA: Diagnosis not present

## 2022-04-07 DIAGNOSIS — R2689 Other abnormalities of gait and mobility: Secondary | ICD-10-CM | POA: Diagnosis not present

## 2022-04-07 DIAGNOSIS — M62512 Muscle wasting and atrophy, not elsewhere classified, left shoulder: Secondary | ICD-10-CM | POA: Diagnosis not present

## 2022-04-07 DIAGNOSIS — M62511 Muscle wasting and atrophy, not elsewhere classified, right shoulder: Secondary | ICD-10-CM | POA: Diagnosis not present

## 2022-04-07 DIAGNOSIS — R488 Other symbolic dysfunctions: Secondary | ICD-10-CM | POA: Diagnosis not present

## 2022-04-07 NOTE — Telephone Encounter (Signed)
Datafied records request [facility]ed to [facility]

## 2022-04-09 DIAGNOSIS — M62552 Muscle wasting and atrophy, not elsewhere classified, left thigh: Secondary | ICD-10-CM | POA: Diagnosis not present

## 2022-04-09 DIAGNOSIS — R488 Other symbolic dysfunctions: Secondary | ICD-10-CM | POA: Diagnosis not present

## 2022-04-09 DIAGNOSIS — R2689 Other abnormalities of gait and mobility: Secondary | ICD-10-CM | POA: Diagnosis not present

## 2022-04-09 DIAGNOSIS — M62551 Muscle wasting and atrophy, not elsewhere classified, right thigh: Secondary | ICD-10-CM | POA: Diagnosis not present

## 2022-04-09 DIAGNOSIS — M62512 Muscle wasting and atrophy, not elsewhere classified, left shoulder: Secondary | ICD-10-CM | POA: Diagnosis not present

## 2022-04-09 DIAGNOSIS — M62511 Muscle wasting and atrophy, not elsewhere classified, right shoulder: Secondary | ICD-10-CM | POA: Diagnosis not present

## 2022-04-11 DIAGNOSIS — E559 Vitamin D deficiency, unspecified: Secondary | ICD-10-CM | POA: Diagnosis not present

## 2022-04-11 DIAGNOSIS — M62551 Muscle wasting and atrophy, not elsewhere classified, right thigh: Secondary | ICD-10-CM | POA: Diagnosis not present

## 2022-04-11 DIAGNOSIS — M62512 Muscle wasting and atrophy, not elsewhere classified, left shoulder: Secondary | ICD-10-CM | POA: Diagnosis not present

## 2022-04-11 DIAGNOSIS — F0390 Unspecified dementia without behavioral disturbance: Secondary | ICD-10-CM | POA: Diagnosis not present

## 2022-04-11 DIAGNOSIS — E785 Hyperlipidemia, unspecified: Secondary | ICD-10-CM | POA: Diagnosis not present

## 2022-04-11 DIAGNOSIS — M62511 Muscle wasting and atrophy, not elsewhere classified, right shoulder: Secondary | ICD-10-CM | POA: Diagnosis not present

## 2022-04-11 DIAGNOSIS — I10 Essential (primary) hypertension: Secondary | ICD-10-CM | POA: Diagnosis not present

## 2022-04-11 DIAGNOSIS — M62552 Muscle wasting and atrophy, not elsewhere classified, left thigh: Secondary | ICD-10-CM | POA: Diagnosis not present

## 2022-04-11 DIAGNOSIS — G40909 Epilepsy, unspecified, not intractable, without status epilepticus: Secondary | ICD-10-CM | POA: Diagnosis not present

## 2022-04-11 DIAGNOSIS — R2689 Other abnormalities of gait and mobility: Secondary | ICD-10-CM | POA: Diagnosis not present

## 2022-04-11 DIAGNOSIS — J309 Allergic rhinitis, unspecified: Secondary | ICD-10-CM | POA: Diagnosis not present

## 2022-04-11 DIAGNOSIS — R488 Other symbolic dysfunctions: Secondary | ICD-10-CM | POA: Diagnosis not present

## 2022-04-12 DIAGNOSIS — M62512 Muscle wasting and atrophy, not elsewhere classified, left shoulder: Secondary | ICD-10-CM | POA: Diagnosis not present

## 2022-04-12 DIAGNOSIS — M62511 Muscle wasting and atrophy, not elsewhere classified, right shoulder: Secondary | ICD-10-CM | POA: Diagnosis not present

## 2022-04-12 DIAGNOSIS — M62551 Muscle wasting and atrophy, not elsewhere classified, right thigh: Secondary | ICD-10-CM | POA: Diagnosis not present

## 2022-04-12 DIAGNOSIS — R488 Other symbolic dysfunctions: Secondary | ICD-10-CM | POA: Diagnosis not present

## 2022-04-12 DIAGNOSIS — R2689 Other abnormalities of gait and mobility: Secondary | ICD-10-CM | POA: Diagnosis not present

## 2022-04-12 DIAGNOSIS — M62552 Muscle wasting and atrophy, not elsewhere classified, left thigh: Secondary | ICD-10-CM | POA: Diagnosis not present

## 2022-04-13 DIAGNOSIS — M62552 Muscle wasting and atrophy, not elsewhere classified, left thigh: Secondary | ICD-10-CM | POA: Diagnosis not present

## 2022-04-13 DIAGNOSIS — R488 Other symbolic dysfunctions: Secondary | ICD-10-CM | POA: Diagnosis not present

## 2022-04-13 DIAGNOSIS — M62512 Muscle wasting and atrophy, not elsewhere classified, left shoulder: Secondary | ICD-10-CM | POA: Diagnosis not present

## 2022-04-13 DIAGNOSIS — M62511 Muscle wasting and atrophy, not elsewhere classified, right shoulder: Secondary | ICD-10-CM | POA: Diagnosis not present

## 2022-04-13 DIAGNOSIS — R2689 Other abnormalities of gait and mobility: Secondary | ICD-10-CM | POA: Diagnosis not present

## 2022-04-13 DIAGNOSIS — M62551 Muscle wasting and atrophy, not elsewhere classified, right thigh: Secondary | ICD-10-CM | POA: Diagnosis not present

## 2022-04-14 DIAGNOSIS — M62511 Muscle wasting and atrophy, not elsewhere classified, right shoulder: Secondary | ICD-10-CM | POA: Diagnosis not present

## 2022-04-14 DIAGNOSIS — M62552 Muscle wasting and atrophy, not elsewhere classified, left thigh: Secondary | ICD-10-CM | POA: Diagnosis not present

## 2022-04-14 DIAGNOSIS — R488 Other symbolic dysfunctions: Secondary | ICD-10-CM | POA: Diagnosis not present

## 2022-04-14 DIAGNOSIS — M62512 Muscle wasting and atrophy, not elsewhere classified, left shoulder: Secondary | ICD-10-CM | POA: Diagnosis not present

## 2022-04-14 DIAGNOSIS — M62551 Muscle wasting and atrophy, not elsewhere classified, right thigh: Secondary | ICD-10-CM | POA: Diagnosis not present

## 2022-04-14 DIAGNOSIS — R2689 Other abnormalities of gait and mobility: Secondary | ICD-10-CM | POA: Diagnosis not present

## 2022-04-19 DIAGNOSIS — M62511 Muscle wasting and atrophy, not elsewhere classified, right shoulder: Secondary | ICD-10-CM | POA: Diagnosis not present

## 2022-04-19 DIAGNOSIS — M62551 Muscle wasting and atrophy, not elsewhere classified, right thigh: Secondary | ICD-10-CM | POA: Diagnosis not present

## 2022-04-19 DIAGNOSIS — R2689 Other abnormalities of gait and mobility: Secondary | ICD-10-CM | POA: Diagnosis not present

## 2022-04-19 DIAGNOSIS — R488 Other symbolic dysfunctions: Secondary | ICD-10-CM | POA: Diagnosis not present

## 2022-04-19 DIAGNOSIS — M62512 Muscle wasting and atrophy, not elsewhere classified, left shoulder: Secondary | ICD-10-CM | POA: Diagnosis not present

## 2022-04-19 DIAGNOSIS — M62552 Muscle wasting and atrophy, not elsewhere classified, left thigh: Secondary | ICD-10-CM | POA: Diagnosis not present

## 2022-04-20 DIAGNOSIS — M62512 Muscle wasting and atrophy, not elsewhere classified, left shoulder: Secondary | ICD-10-CM | POA: Diagnosis not present

## 2022-04-20 DIAGNOSIS — M62551 Muscle wasting and atrophy, not elsewhere classified, right thigh: Secondary | ICD-10-CM | POA: Diagnosis not present

## 2022-04-20 DIAGNOSIS — R2689 Other abnormalities of gait and mobility: Secondary | ICD-10-CM | POA: Diagnosis not present

## 2022-04-20 DIAGNOSIS — R488 Other symbolic dysfunctions: Secondary | ICD-10-CM | POA: Diagnosis not present

## 2022-04-20 DIAGNOSIS — M62511 Muscle wasting and atrophy, not elsewhere classified, right shoulder: Secondary | ICD-10-CM | POA: Diagnosis not present

## 2022-04-20 DIAGNOSIS — M62552 Muscle wasting and atrophy, not elsewhere classified, left thigh: Secondary | ICD-10-CM | POA: Diagnosis not present

## 2022-04-21 DIAGNOSIS — M62551 Muscle wasting and atrophy, not elsewhere classified, right thigh: Secondary | ICD-10-CM | POA: Diagnosis not present

## 2022-04-21 DIAGNOSIS — M62552 Muscle wasting and atrophy, not elsewhere classified, left thigh: Secondary | ICD-10-CM | POA: Diagnosis not present

## 2022-04-21 DIAGNOSIS — M62511 Muscle wasting and atrophy, not elsewhere classified, right shoulder: Secondary | ICD-10-CM | POA: Diagnosis not present

## 2022-04-21 DIAGNOSIS — M62512 Muscle wasting and atrophy, not elsewhere classified, left shoulder: Secondary | ICD-10-CM | POA: Diagnosis not present

## 2022-04-21 DIAGNOSIS — R2689 Other abnormalities of gait and mobility: Secondary | ICD-10-CM | POA: Diagnosis not present

## 2022-04-21 DIAGNOSIS — R488 Other symbolic dysfunctions: Secondary | ICD-10-CM | POA: Diagnosis not present

## 2022-04-22 DIAGNOSIS — F0390 Unspecified dementia without behavioral disturbance: Secondary | ICD-10-CM | POA: Diagnosis not present

## 2022-04-22 DIAGNOSIS — F419 Anxiety disorder, unspecified: Secondary | ICD-10-CM | POA: Diagnosis not present

## 2022-04-22 DIAGNOSIS — F330 Major depressive disorder, recurrent, mild: Secondary | ICD-10-CM | POA: Diagnosis not present

## 2022-04-26 DIAGNOSIS — M62511 Muscle wasting and atrophy, not elsewhere classified, right shoulder: Secondary | ICD-10-CM | POA: Diagnosis not present

## 2022-04-26 DIAGNOSIS — M62551 Muscle wasting and atrophy, not elsewhere classified, right thigh: Secondary | ICD-10-CM | POA: Diagnosis not present

## 2022-04-26 DIAGNOSIS — R2689 Other abnormalities of gait and mobility: Secondary | ICD-10-CM | POA: Diagnosis not present

## 2022-04-26 DIAGNOSIS — M62512 Muscle wasting and atrophy, not elsewhere classified, left shoulder: Secondary | ICD-10-CM | POA: Diagnosis not present

## 2022-04-26 DIAGNOSIS — M62541 Muscle wasting and atrophy, not elsewhere classified, right hand: Secondary | ICD-10-CM | POA: Diagnosis not present

## 2022-04-26 DIAGNOSIS — M62542 Muscle wasting and atrophy, not elsewhere classified, left hand: Secondary | ICD-10-CM | POA: Diagnosis not present

## 2022-04-26 DIAGNOSIS — M62552 Muscle wasting and atrophy, not elsewhere classified, left thigh: Secondary | ICD-10-CM | POA: Diagnosis not present

## 2022-04-26 DIAGNOSIS — R488 Other symbolic dysfunctions: Secondary | ICD-10-CM | POA: Diagnosis not present

## 2022-04-27 DIAGNOSIS — M62511 Muscle wasting and atrophy, not elsewhere classified, right shoulder: Secondary | ICD-10-CM | POA: Diagnosis not present

## 2022-04-27 DIAGNOSIS — R488 Other symbolic dysfunctions: Secondary | ICD-10-CM | POA: Diagnosis not present

## 2022-04-27 DIAGNOSIS — M62551 Muscle wasting and atrophy, not elsewhere classified, right thigh: Secondary | ICD-10-CM | POA: Diagnosis not present

## 2022-04-27 DIAGNOSIS — M62512 Muscle wasting and atrophy, not elsewhere classified, left shoulder: Secondary | ICD-10-CM | POA: Diagnosis not present

## 2022-04-27 DIAGNOSIS — R2689 Other abnormalities of gait and mobility: Secondary | ICD-10-CM | POA: Diagnosis not present

## 2022-04-27 DIAGNOSIS — M62552 Muscle wasting and atrophy, not elsewhere classified, left thigh: Secondary | ICD-10-CM | POA: Diagnosis not present

## 2022-04-28 DIAGNOSIS — M62551 Muscle wasting and atrophy, not elsewhere classified, right thigh: Secondary | ICD-10-CM | POA: Diagnosis not present

## 2022-04-28 DIAGNOSIS — R488 Other symbolic dysfunctions: Secondary | ICD-10-CM | POA: Diagnosis not present

## 2022-04-28 DIAGNOSIS — M62512 Muscle wasting and atrophy, not elsewhere classified, left shoulder: Secondary | ICD-10-CM | POA: Diagnosis not present

## 2022-04-28 DIAGNOSIS — M62552 Muscle wasting and atrophy, not elsewhere classified, left thigh: Secondary | ICD-10-CM | POA: Diagnosis not present

## 2022-04-28 DIAGNOSIS — R2689 Other abnormalities of gait and mobility: Secondary | ICD-10-CM | POA: Diagnosis not present

## 2022-04-28 DIAGNOSIS — M62511 Muscle wasting and atrophy, not elsewhere classified, right shoulder: Secondary | ICD-10-CM | POA: Diagnosis not present

## 2022-04-29 DIAGNOSIS — M62551 Muscle wasting and atrophy, not elsewhere classified, right thigh: Secondary | ICD-10-CM | POA: Diagnosis not present

## 2022-04-29 DIAGNOSIS — M62511 Muscle wasting and atrophy, not elsewhere classified, right shoulder: Secondary | ICD-10-CM | POA: Diagnosis not present

## 2022-04-29 DIAGNOSIS — M62552 Muscle wasting and atrophy, not elsewhere classified, left thigh: Secondary | ICD-10-CM | POA: Diagnosis not present

## 2022-04-29 DIAGNOSIS — R488 Other symbolic dysfunctions: Secondary | ICD-10-CM | POA: Diagnosis not present

## 2022-04-29 DIAGNOSIS — M62512 Muscle wasting and atrophy, not elsewhere classified, left shoulder: Secondary | ICD-10-CM | POA: Diagnosis not present

## 2022-04-29 DIAGNOSIS — R2689 Other abnormalities of gait and mobility: Secondary | ICD-10-CM | POA: Diagnosis not present

## 2022-05-02 DIAGNOSIS — M62551 Muscle wasting and atrophy, not elsewhere classified, right thigh: Secondary | ICD-10-CM | POA: Diagnosis not present

## 2022-05-02 DIAGNOSIS — M62552 Muscle wasting and atrophy, not elsewhere classified, left thigh: Secondary | ICD-10-CM | POA: Diagnosis not present

## 2022-05-02 DIAGNOSIS — M62511 Muscle wasting and atrophy, not elsewhere classified, right shoulder: Secondary | ICD-10-CM | POA: Diagnosis not present

## 2022-05-02 DIAGNOSIS — R488 Other symbolic dysfunctions: Secondary | ICD-10-CM | POA: Diagnosis not present

## 2022-05-02 DIAGNOSIS — M62512 Muscle wasting and atrophy, not elsewhere classified, left shoulder: Secondary | ICD-10-CM | POA: Diagnosis not present

## 2022-05-02 DIAGNOSIS — R2689 Other abnormalities of gait and mobility: Secondary | ICD-10-CM | POA: Diagnosis not present

## 2022-05-03 DIAGNOSIS — R488 Other symbolic dysfunctions: Secondary | ICD-10-CM | POA: Diagnosis not present

## 2022-05-03 DIAGNOSIS — M62552 Muscle wasting and atrophy, not elsewhere classified, left thigh: Secondary | ICD-10-CM | POA: Diagnosis not present

## 2022-05-03 DIAGNOSIS — M62551 Muscle wasting and atrophy, not elsewhere classified, right thigh: Secondary | ICD-10-CM | POA: Diagnosis not present

## 2022-05-03 DIAGNOSIS — M62511 Muscle wasting and atrophy, not elsewhere classified, right shoulder: Secondary | ICD-10-CM | POA: Diagnosis not present

## 2022-05-03 DIAGNOSIS — M62512 Muscle wasting and atrophy, not elsewhere classified, left shoulder: Secondary | ICD-10-CM | POA: Diagnosis not present

## 2022-05-03 DIAGNOSIS — R2689 Other abnormalities of gait and mobility: Secondary | ICD-10-CM | POA: Diagnosis not present

## 2022-05-04 DIAGNOSIS — M62512 Muscle wasting and atrophy, not elsewhere classified, left shoulder: Secondary | ICD-10-CM | POA: Diagnosis not present

## 2022-05-04 DIAGNOSIS — M62551 Muscle wasting and atrophy, not elsewhere classified, right thigh: Secondary | ICD-10-CM | POA: Diagnosis not present

## 2022-05-04 DIAGNOSIS — M62552 Muscle wasting and atrophy, not elsewhere classified, left thigh: Secondary | ICD-10-CM | POA: Diagnosis not present

## 2022-05-04 DIAGNOSIS — M62511 Muscle wasting and atrophy, not elsewhere classified, right shoulder: Secondary | ICD-10-CM | POA: Diagnosis not present

## 2022-05-04 DIAGNOSIS — R488 Other symbolic dysfunctions: Secondary | ICD-10-CM | POA: Diagnosis not present

## 2022-05-04 DIAGNOSIS — R2689 Other abnormalities of gait and mobility: Secondary | ICD-10-CM | POA: Diagnosis not present

## 2022-05-05 DIAGNOSIS — R488 Other symbolic dysfunctions: Secondary | ICD-10-CM | POA: Diagnosis not present

## 2022-05-05 DIAGNOSIS — M62511 Muscle wasting and atrophy, not elsewhere classified, right shoulder: Secondary | ICD-10-CM | POA: Diagnosis not present

## 2022-05-05 DIAGNOSIS — M62512 Muscle wasting and atrophy, not elsewhere classified, left shoulder: Secondary | ICD-10-CM | POA: Diagnosis not present

## 2022-05-05 DIAGNOSIS — R2689 Other abnormalities of gait and mobility: Secondary | ICD-10-CM | POA: Diagnosis not present

## 2022-05-05 DIAGNOSIS — M62552 Muscle wasting and atrophy, not elsewhere classified, left thigh: Secondary | ICD-10-CM | POA: Diagnosis not present

## 2022-05-05 DIAGNOSIS — M62551 Muscle wasting and atrophy, not elsewhere classified, right thigh: Secondary | ICD-10-CM | POA: Diagnosis not present

## 2022-05-06 DIAGNOSIS — M62551 Muscle wasting and atrophy, not elsewhere classified, right thigh: Secondary | ICD-10-CM | POA: Diagnosis not present

## 2022-05-06 DIAGNOSIS — R488 Other symbolic dysfunctions: Secondary | ICD-10-CM | POA: Diagnosis not present

## 2022-05-06 DIAGNOSIS — M62552 Muscle wasting and atrophy, not elsewhere classified, left thigh: Secondary | ICD-10-CM | POA: Diagnosis not present

## 2022-05-06 DIAGNOSIS — R2689 Other abnormalities of gait and mobility: Secondary | ICD-10-CM | POA: Diagnosis not present

## 2022-05-06 DIAGNOSIS — M62511 Muscle wasting and atrophy, not elsewhere classified, right shoulder: Secondary | ICD-10-CM | POA: Diagnosis not present

## 2022-05-06 DIAGNOSIS — M62512 Muscle wasting and atrophy, not elsewhere classified, left shoulder: Secondary | ICD-10-CM | POA: Diagnosis not present

## 2022-05-08 DIAGNOSIS — M62511 Muscle wasting and atrophy, not elsewhere classified, right shoulder: Secondary | ICD-10-CM | POA: Diagnosis not present

## 2022-05-08 DIAGNOSIS — R488 Other symbolic dysfunctions: Secondary | ICD-10-CM | POA: Diagnosis not present

## 2022-05-08 DIAGNOSIS — R2689 Other abnormalities of gait and mobility: Secondary | ICD-10-CM | POA: Diagnosis not present

## 2022-05-08 DIAGNOSIS — M62551 Muscle wasting and atrophy, not elsewhere classified, right thigh: Secondary | ICD-10-CM | POA: Diagnosis not present

## 2022-05-08 DIAGNOSIS — M62552 Muscle wasting and atrophy, not elsewhere classified, left thigh: Secondary | ICD-10-CM | POA: Diagnosis not present

## 2022-05-08 DIAGNOSIS — M62512 Muscle wasting and atrophy, not elsewhere classified, left shoulder: Secondary | ICD-10-CM | POA: Diagnosis not present

## 2022-05-09 DIAGNOSIS — M62511 Muscle wasting and atrophy, not elsewhere classified, right shoulder: Secondary | ICD-10-CM | POA: Diagnosis not present

## 2022-05-09 DIAGNOSIS — M62512 Muscle wasting and atrophy, not elsewhere classified, left shoulder: Secondary | ICD-10-CM | POA: Diagnosis not present

## 2022-05-09 DIAGNOSIS — M62552 Muscle wasting and atrophy, not elsewhere classified, left thigh: Secondary | ICD-10-CM | POA: Diagnosis not present

## 2022-05-09 DIAGNOSIS — R2689 Other abnormalities of gait and mobility: Secondary | ICD-10-CM | POA: Diagnosis not present

## 2022-05-09 DIAGNOSIS — M62551 Muscle wasting and atrophy, not elsewhere classified, right thigh: Secondary | ICD-10-CM | POA: Diagnosis not present

## 2022-05-09 DIAGNOSIS — R488 Other symbolic dysfunctions: Secondary | ICD-10-CM | POA: Diagnosis not present

## 2022-05-10 DIAGNOSIS — R2689 Other abnormalities of gait and mobility: Secondary | ICD-10-CM | POA: Diagnosis not present

## 2022-05-10 DIAGNOSIS — M62511 Muscle wasting and atrophy, not elsewhere classified, right shoulder: Secondary | ICD-10-CM | POA: Diagnosis not present

## 2022-05-10 DIAGNOSIS — R488 Other symbolic dysfunctions: Secondary | ICD-10-CM | POA: Diagnosis not present

## 2022-05-10 DIAGNOSIS — M62551 Muscle wasting and atrophy, not elsewhere classified, right thigh: Secondary | ICD-10-CM | POA: Diagnosis not present

## 2022-05-10 DIAGNOSIS — M62512 Muscle wasting and atrophy, not elsewhere classified, left shoulder: Secondary | ICD-10-CM | POA: Diagnosis not present

## 2022-05-10 DIAGNOSIS — M62552 Muscle wasting and atrophy, not elsewhere classified, left thigh: Secondary | ICD-10-CM | POA: Diagnosis not present

## 2022-05-12 DIAGNOSIS — R2689 Other abnormalities of gait and mobility: Secondary | ICD-10-CM | POA: Diagnosis not present

## 2022-05-12 DIAGNOSIS — M62552 Muscle wasting and atrophy, not elsewhere classified, left thigh: Secondary | ICD-10-CM | POA: Diagnosis not present

## 2022-05-12 DIAGNOSIS — M62512 Muscle wasting and atrophy, not elsewhere classified, left shoulder: Secondary | ICD-10-CM | POA: Diagnosis not present

## 2022-05-12 DIAGNOSIS — R488 Other symbolic dysfunctions: Secondary | ICD-10-CM | POA: Diagnosis not present

## 2022-05-12 DIAGNOSIS — M62551 Muscle wasting and atrophy, not elsewhere classified, right thigh: Secondary | ICD-10-CM | POA: Diagnosis not present

## 2022-05-12 DIAGNOSIS — M62511 Muscle wasting and atrophy, not elsewhere classified, right shoulder: Secondary | ICD-10-CM | POA: Diagnosis not present

## 2022-05-16 DIAGNOSIS — M62512 Muscle wasting and atrophy, not elsewhere classified, left shoulder: Secondary | ICD-10-CM | POA: Diagnosis not present

## 2022-05-16 DIAGNOSIS — I10 Essential (primary) hypertension: Secondary | ICD-10-CM | POA: Diagnosis not present

## 2022-05-16 DIAGNOSIS — R488 Other symbolic dysfunctions: Secondary | ICD-10-CM | POA: Diagnosis not present

## 2022-05-16 DIAGNOSIS — M62552 Muscle wasting and atrophy, not elsewhere classified, left thigh: Secondary | ICD-10-CM | POA: Diagnosis not present

## 2022-05-16 DIAGNOSIS — F339 Major depressive disorder, recurrent, unspecified: Secondary | ICD-10-CM | POA: Diagnosis not present

## 2022-05-16 DIAGNOSIS — M62551 Muscle wasting and atrophy, not elsewhere classified, right thigh: Secondary | ICD-10-CM | POA: Diagnosis not present

## 2022-05-16 DIAGNOSIS — G40909 Epilepsy, unspecified, not intractable, without status epilepticus: Secondary | ICD-10-CM | POA: Diagnosis not present

## 2022-05-16 DIAGNOSIS — R2689 Other abnormalities of gait and mobility: Secondary | ICD-10-CM | POA: Diagnosis not present

## 2022-05-16 DIAGNOSIS — E785 Hyperlipidemia, unspecified: Secondary | ICD-10-CM | POA: Diagnosis not present

## 2022-05-16 DIAGNOSIS — J309 Allergic rhinitis, unspecified: Secondary | ICD-10-CM | POA: Diagnosis not present

## 2022-05-16 DIAGNOSIS — E559 Vitamin D deficiency, unspecified: Secondary | ICD-10-CM | POA: Diagnosis not present

## 2022-05-16 DIAGNOSIS — F0390 Unspecified dementia without behavioral disturbance: Secondary | ICD-10-CM | POA: Diagnosis not present

## 2022-05-16 DIAGNOSIS — F329 Major depressive disorder, single episode, unspecified: Secondary | ICD-10-CM | POA: Diagnosis not present

## 2022-05-16 DIAGNOSIS — M62511 Muscle wasting and atrophy, not elsewhere classified, right shoulder: Secondary | ICD-10-CM | POA: Diagnosis not present

## 2022-05-17 DIAGNOSIS — F330 Major depressive disorder, recurrent, mild: Secondary | ICD-10-CM | POA: Diagnosis not present

## 2022-05-17 DIAGNOSIS — M62512 Muscle wasting and atrophy, not elsewhere classified, left shoulder: Secondary | ICD-10-CM | POA: Diagnosis not present

## 2022-05-17 DIAGNOSIS — R488 Other symbolic dysfunctions: Secondary | ICD-10-CM | POA: Diagnosis not present

## 2022-05-17 DIAGNOSIS — M62551 Muscle wasting and atrophy, not elsewhere classified, right thigh: Secondary | ICD-10-CM | POA: Diagnosis not present

## 2022-05-17 DIAGNOSIS — M62552 Muscle wasting and atrophy, not elsewhere classified, left thigh: Secondary | ICD-10-CM | POA: Diagnosis not present

## 2022-05-17 DIAGNOSIS — F419 Anxiety disorder, unspecified: Secondary | ICD-10-CM | POA: Diagnosis not present

## 2022-05-17 DIAGNOSIS — R2689 Other abnormalities of gait and mobility: Secondary | ICD-10-CM | POA: Diagnosis not present

## 2022-05-17 DIAGNOSIS — G40909 Epilepsy, unspecified, not intractable, without status epilepticus: Secondary | ICD-10-CM | POA: Diagnosis not present

## 2022-05-17 DIAGNOSIS — M62511 Muscle wasting and atrophy, not elsewhere classified, right shoulder: Secondary | ICD-10-CM | POA: Diagnosis not present

## 2022-05-17 DIAGNOSIS — F0390 Unspecified dementia without behavioral disturbance: Secondary | ICD-10-CM | POA: Diagnosis not present

## 2022-05-18 DIAGNOSIS — M62551 Muscle wasting and atrophy, not elsewhere classified, right thigh: Secondary | ICD-10-CM | POA: Diagnosis not present

## 2022-05-18 DIAGNOSIS — R2689 Other abnormalities of gait and mobility: Secondary | ICD-10-CM | POA: Diagnosis not present

## 2022-05-18 DIAGNOSIS — M62512 Muscle wasting and atrophy, not elsewhere classified, left shoulder: Secondary | ICD-10-CM | POA: Diagnosis not present

## 2022-05-18 DIAGNOSIS — R488 Other symbolic dysfunctions: Secondary | ICD-10-CM | POA: Diagnosis not present

## 2022-05-18 DIAGNOSIS — M62552 Muscle wasting and atrophy, not elsewhere classified, left thigh: Secondary | ICD-10-CM | POA: Diagnosis not present

## 2022-05-18 DIAGNOSIS — M62511 Muscle wasting and atrophy, not elsewhere classified, right shoulder: Secondary | ICD-10-CM | POA: Diagnosis not present

## 2022-05-20 DIAGNOSIS — M62552 Muscle wasting and atrophy, not elsewhere classified, left thigh: Secondary | ICD-10-CM | POA: Diagnosis not present

## 2022-05-20 DIAGNOSIS — R2689 Other abnormalities of gait and mobility: Secondary | ICD-10-CM | POA: Diagnosis not present

## 2022-05-20 DIAGNOSIS — M62511 Muscle wasting and atrophy, not elsewhere classified, right shoulder: Secondary | ICD-10-CM | POA: Diagnosis not present

## 2022-05-20 DIAGNOSIS — M62512 Muscle wasting and atrophy, not elsewhere classified, left shoulder: Secondary | ICD-10-CM | POA: Diagnosis not present

## 2022-05-20 DIAGNOSIS — R488 Other symbolic dysfunctions: Secondary | ICD-10-CM | POA: Diagnosis not present

## 2022-05-20 DIAGNOSIS — M62551 Muscle wasting and atrophy, not elsewhere classified, right thigh: Secondary | ICD-10-CM | POA: Diagnosis not present

## 2022-05-23 DIAGNOSIS — M62552 Muscle wasting and atrophy, not elsewhere classified, left thigh: Secondary | ICD-10-CM | POA: Diagnosis not present

## 2022-05-23 DIAGNOSIS — M62511 Muscle wasting and atrophy, not elsewhere classified, right shoulder: Secondary | ICD-10-CM | POA: Diagnosis not present

## 2022-05-23 DIAGNOSIS — M62551 Muscle wasting and atrophy, not elsewhere classified, right thigh: Secondary | ICD-10-CM | POA: Diagnosis not present

## 2022-05-23 DIAGNOSIS — R2689 Other abnormalities of gait and mobility: Secondary | ICD-10-CM | POA: Diagnosis not present

## 2022-05-23 DIAGNOSIS — M62512 Muscle wasting and atrophy, not elsewhere classified, left shoulder: Secondary | ICD-10-CM | POA: Diagnosis not present

## 2022-05-23 DIAGNOSIS — R488 Other symbolic dysfunctions: Secondary | ICD-10-CM | POA: Diagnosis not present

## 2022-05-24 ENCOUNTER — Encounter: Payer: Self-pay | Admitting: Family Medicine

## 2022-05-24 DIAGNOSIS — R2689 Other abnormalities of gait and mobility: Secondary | ICD-10-CM | POA: Diagnosis not present

## 2022-05-24 DIAGNOSIS — M62551 Muscle wasting and atrophy, not elsewhere classified, right thigh: Secondary | ICD-10-CM | POA: Diagnosis not present

## 2022-05-24 DIAGNOSIS — M62552 Muscle wasting and atrophy, not elsewhere classified, left thigh: Secondary | ICD-10-CM | POA: Diagnosis not present

## 2022-05-24 DIAGNOSIS — M62512 Muscle wasting and atrophy, not elsewhere classified, left shoulder: Secondary | ICD-10-CM | POA: Diagnosis not present

## 2022-05-24 DIAGNOSIS — R488 Other symbolic dysfunctions: Secondary | ICD-10-CM | POA: Diagnosis not present

## 2022-05-24 DIAGNOSIS — M62511 Muscle wasting and atrophy, not elsewhere classified, right shoulder: Secondary | ICD-10-CM | POA: Diagnosis not present

## 2022-05-25 DIAGNOSIS — R2689 Other abnormalities of gait and mobility: Secondary | ICD-10-CM | POA: Diagnosis not present

## 2022-05-25 DIAGNOSIS — R488 Other symbolic dysfunctions: Secondary | ICD-10-CM | POA: Diagnosis not present

## 2022-05-25 DIAGNOSIS — M62511 Muscle wasting and atrophy, not elsewhere classified, right shoulder: Secondary | ICD-10-CM | POA: Diagnosis not present

## 2022-05-25 DIAGNOSIS — M62552 Muscle wasting and atrophy, not elsewhere classified, left thigh: Secondary | ICD-10-CM | POA: Diagnosis not present

## 2022-05-25 DIAGNOSIS — M62551 Muscle wasting and atrophy, not elsewhere classified, right thigh: Secondary | ICD-10-CM | POA: Diagnosis not present

## 2022-05-25 DIAGNOSIS — M62512 Muscle wasting and atrophy, not elsewhere classified, left shoulder: Secondary | ICD-10-CM | POA: Diagnosis not present

## 2022-05-25 NOTE — Telephone Encounter (Signed)
Please send the following in a letter (with pt information at top, letterhead), to daughter so that she can provide to insurance company.  To Whom It May Concern,  I have been the primary care physician for Lova Urbieta since 2010.  There is family history of dementia in her father and her sister.  She had done quite well through 2019, with normal Mini-cog screen at her wellness visit in 09/2017.  By Fall of 2020 she started becoming more forgetful, repeating herself all the time.  She was evaluated by neurologist in 02/2019, had mini-mental status exam score of 20/30.  (MRI was recommended, as was medication, but patient refused). 6 months later, her MMSE declined to 17/30, and could only name 5 animals. At that point she was in independent living, no longer driving. Namenda was prescribed, but only taken for a short time due to side effects (dizziness).   In 2022, there was concern regarding her use of over-the-counter medications, and overuse of tylenol and alka selzer cold plus. By 06/2021, this behavior had gotten significantly worse, contributing to medical issues (new onset iron deficiency anemia related to aspirin use in the OTC cold medication). Family had to start giving her placebo pills so that she would not have issues related to excessive tylenol intake (family reported taking up to 30 pills daily). At this point she was moved into assisted living, where medications could be distributed by staff and monitored more closely.  She has continued to decline, with MMSE in 03/2022 dropping to 11.  Thankfully, the OTC medication overuse is no longer an issue, as she is in a safer environment now.  It was clear on my recent visit (03/2022) that her dementia has continued to worsen. She had many repeated actions (constantly applying hand lotions), repeated questions, confusion and some agitation.  She has had a fairly rapid progression of her dementia over a 3 year time period, with expected continued  decline with need of additional resources.     03/30/2022    3:37 PM 09/11/2019    9:28 AM 03/13/2019    8:23 AM  MMSE - Mini Mental State Exam  Orientation to time 1 2 2   Orientation to Place 1 2 4   Registration 3 3 3   Attention/ Calculation 0 1 1  Recall 0 2 3  Language- name 2 objects 2 2 2   Language- repeat 0 0 0  Language- follow 3 step command 3 3 3   Language- read & follow direction 1 1 1   Write a sentence 0 1 1  Copy design 0 0 0  Copy design-comments  5 animals   Total score 11 17 20     Please feel free to contact me with any additional questions or concerns.   Sincerely,  Rita Ohara, MD

## 2022-05-26 ENCOUNTER — Encounter: Payer: Self-pay | Admitting: *Deleted

## 2022-05-26 DIAGNOSIS — M62542 Muscle wasting and atrophy, not elsewhere classified, left hand: Secondary | ICD-10-CM | POA: Diagnosis not present

## 2022-05-26 DIAGNOSIS — M62552 Muscle wasting and atrophy, not elsewhere classified, left thigh: Secondary | ICD-10-CM | POA: Diagnosis not present

## 2022-05-26 DIAGNOSIS — M62511 Muscle wasting and atrophy, not elsewhere classified, right shoulder: Secondary | ICD-10-CM | POA: Diagnosis not present

## 2022-05-26 DIAGNOSIS — M62541 Muscle wasting and atrophy, not elsewhere classified, right hand: Secondary | ICD-10-CM | POA: Diagnosis not present

## 2022-05-26 DIAGNOSIS — R2689 Other abnormalities of gait and mobility: Secondary | ICD-10-CM | POA: Diagnosis not present

## 2022-05-26 DIAGNOSIS — R488 Other symbolic dysfunctions: Secondary | ICD-10-CM | POA: Diagnosis not present

## 2022-05-26 DIAGNOSIS — M62512 Muscle wasting and atrophy, not elsewhere classified, left shoulder: Secondary | ICD-10-CM | POA: Diagnosis not present

## 2022-05-26 DIAGNOSIS — M62551 Muscle wasting and atrophy, not elsewhere classified, right thigh: Secondary | ICD-10-CM | POA: Diagnosis not present

## 2022-05-30 DIAGNOSIS — M62511 Muscle wasting and atrophy, not elsewhere classified, right shoulder: Secondary | ICD-10-CM | POA: Diagnosis not present

## 2022-05-30 DIAGNOSIS — R488 Other symbolic dysfunctions: Secondary | ICD-10-CM | POA: Diagnosis not present

## 2022-05-30 DIAGNOSIS — M62512 Muscle wasting and atrophy, not elsewhere classified, left shoulder: Secondary | ICD-10-CM | POA: Diagnosis not present

## 2022-05-30 DIAGNOSIS — M62552 Muscle wasting and atrophy, not elsewhere classified, left thigh: Secondary | ICD-10-CM | POA: Diagnosis not present

## 2022-05-30 DIAGNOSIS — M62551 Muscle wasting and atrophy, not elsewhere classified, right thigh: Secondary | ICD-10-CM | POA: Diagnosis not present

## 2022-05-30 DIAGNOSIS — R2689 Other abnormalities of gait and mobility: Secondary | ICD-10-CM | POA: Diagnosis not present

## 2022-05-31 DIAGNOSIS — R488 Other symbolic dysfunctions: Secondary | ICD-10-CM | POA: Diagnosis not present

## 2022-05-31 DIAGNOSIS — M62552 Muscle wasting and atrophy, not elsewhere classified, left thigh: Secondary | ICD-10-CM | POA: Diagnosis not present

## 2022-05-31 DIAGNOSIS — M62512 Muscle wasting and atrophy, not elsewhere classified, left shoulder: Secondary | ICD-10-CM | POA: Diagnosis not present

## 2022-05-31 DIAGNOSIS — M62551 Muscle wasting and atrophy, not elsewhere classified, right thigh: Secondary | ICD-10-CM | POA: Diagnosis not present

## 2022-05-31 DIAGNOSIS — R2689 Other abnormalities of gait and mobility: Secondary | ICD-10-CM | POA: Diagnosis not present

## 2022-05-31 DIAGNOSIS — M62511 Muscle wasting and atrophy, not elsewhere classified, right shoulder: Secondary | ICD-10-CM | POA: Diagnosis not present

## 2022-06-02 DIAGNOSIS — M62511 Muscle wasting and atrophy, not elsewhere classified, right shoulder: Secondary | ICD-10-CM | POA: Diagnosis not present

## 2022-06-02 DIAGNOSIS — R2689 Other abnormalities of gait and mobility: Secondary | ICD-10-CM | POA: Diagnosis not present

## 2022-06-02 DIAGNOSIS — M62551 Muscle wasting and atrophy, not elsewhere classified, right thigh: Secondary | ICD-10-CM | POA: Diagnosis not present

## 2022-06-02 DIAGNOSIS — R488 Other symbolic dysfunctions: Secondary | ICD-10-CM | POA: Diagnosis not present

## 2022-06-02 DIAGNOSIS — M62552 Muscle wasting and atrophy, not elsewhere classified, left thigh: Secondary | ICD-10-CM | POA: Diagnosis not present

## 2022-06-02 DIAGNOSIS — M62512 Muscle wasting and atrophy, not elsewhere classified, left shoulder: Secondary | ICD-10-CM | POA: Diagnosis not present

## 2022-06-06 DIAGNOSIS — F0390 Unspecified dementia without behavioral disturbance: Secondary | ICD-10-CM | POA: Diagnosis not present

## 2022-06-06 DIAGNOSIS — R350 Frequency of micturition: Secondary | ICD-10-CM | POA: Diagnosis not present

## 2022-06-07 DIAGNOSIS — M62511 Muscle wasting and atrophy, not elsewhere classified, right shoulder: Secondary | ICD-10-CM | POA: Diagnosis not present

## 2022-06-07 DIAGNOSIS — M62552 Muscle wasting and atrophy, not elsewhere classified, left thigh: Secondary | ICD-10-CM | POA: Diagnosis not present

## 2022-06-07 DIAGNOSIS — M62512 Muscle wasting and atrophy, not elsewhere classified, left shoulder: Secondary | ICD-10-CM | POA: Diagnosis not present

## 2022-06-07 DIAGNOSIS — R2689 Other abnormalities of gait and mobility: Secondary | ICD-10-CM | POA: Diagnosis not present

## 2022-06-07 DIAGNOSIS — R488 Other symbolic dysfunctions: Secondary | ICD-10-CM | POA: Diagnosis not present

## 2022-06-07 DIAGNOSIS — M62551 Muscle wasting and atrophy, not elsewhere classified, right thigh: Secondary | ICD-10-CM | POA: Diagnosis not present

## 2022-06-08 DIAGNOSIS — M62512 Muscle wasting and atrophy, not elsewhere classified, left shoulder: Secondary | ICD-10-CM | POA: Diagnosis not present

## 2022-06-08 DIAGNOSIS — M62551 Muscle wasting and atrophy, not elsewhere classified, right thigh: Secondary | ICD-10-CM | POA: Diagnosis not present

## 2022-06-08 DIAGNOSIS — M62511 Muscle wasting and atrophy, not elsewhere classified, right shoulder: Secondary | ICD-10-CM | POA: Diagnosis not present

## 2022-06-08 DIAGNOSIS — R2689 Other abnormalities of gait and mobility: Secondary | ICD-10-CM | POA: Diagnosis not present

## 2022-06-08 DIAGNOSIS — M62552 Muscle wasting and atrophy, not elsewhere classified, left thigh: Secondary | ICD-10-CM | POA: Diagnosis not present

## 2022-06-08 DIAGNOSIS — R488 Other symbolic dysfunctions: Secondary | ICD-10-CM | POA: Diagnosis not present

## 2022-06-09 DIAGNOSIS — M62511 Muscle wasting and atrophy, not elsewhere classified, right shoulder: Secondary | ICD-10-CM | POA: Diagnosis not present

## 2022-06-09 DIAGNOSIS — M62551 Muscle wasting and atrophy, not elsewhere classified, right thigh: Secondary | ICD-10-CM | POA: Diagnosis not present

## 2022-06-09 DIAGNOSIS — M62512 Muscle wasting and atrophy, not elsewhere classified, left shoulder: Secondary | ICD-10-CM | POA: Diagnosis not present

## 2022-06-09 DIAGNOSIS — R2689 Other abnormalities of gait and mobility: Secondary | ICD-10-CM | POA: Diagnosis not present

## 2022-06-09 DIAGNOSIS — R488 Other symbolic dysfunctions: Secondary | ICD-10-CM | POA: Diagnosis not present

## 2022-06-09 DIAGNOSIS — M62552 Muscle wasting and atrophy, not elsewhere classified, left thigh: Secondary | ICD-10-CM | POA: Diagnosis not present

## 2022-06-14 DIAGNOSIS — R2689 Other abnormalities of gait and mobility: Secondary | ICD-10-CM | POA: Diagnosis not present

## 2022-06-14 DIAGNOSIS — M62512 Muscle wasting and atrophy, not elsewhere classified, left shoulder: Secondary | ICD-10-CM | POA: Diagnosis not present

## 2022-06-14 DIAGNOSIS — M62552 Muscle wasting and atrophy, not elsewhere classified, left thigh: Secondary | ICD-10-CM | POA: Diagnosis not present

## 2022-06-14 DIAGNOSIS — M62551 Muscle wasting and atrophy, not elsewhere classified, right thigh: Secondary | ICD-10-CM | POA: Diagnosis not present

## 2022-06-14 DIAGNOSIS — F0390 Unspecified dementia without behavioral disturbance: Secondary | ICD-10-CM | POA: Diagnosis not present

## 2022-06-14 DIAGNOSIS — F419 Anxiety disorder, unspecified: Secondary | ICD-10-CM | POA: Diagnosis not present

## 2022-06-14 DIAGNOSIS — R488 Other symbolic dysfunctions: Secondary | ICD-10-CM | POA: Diagnosis not present

## 2022-06-14 DIAGNOSIS — M62511 Muscle wasting and atrophy, not elsewhere classified, right shoulder: Secondary | ICD-10-CM | POA: Diagnosis not present

## 2022-06-14 DIAGNOSIS — F339 Major depressive disorder, recurrent, unspecified: Secondary | ICD-10-CM | POA: Diagnosis not present

## 2022-06-16 DIAGNOSIS — R488 Other symbolic dysfunctions: Secondary | ICD-10-CM | POA: Diagnosis not present

## 2022-06-16 DIAGNOSIS — M62511 Muscle wasting and atrophy, not elsewhere classified, right shoulder: Secondary | ICD-10-CM | POA: Diagnosis not present

## 2022-06-16 DIAGNOSIS — M62552 Muscle wasting and atrophy, not elsewhere classified, left thigh: Secondary | ICD-10-CM | POA: Diagnosis not present

## 2022-06-16 DIAGNOSIS — M62551 Muscle wasting and atrophy, not elsewhere classified, right thigh: Secondary | ICD-10-CM | POA: Diagnosis not present

## 2022-06-16 DIAGNOSIS — R2689 Other abnormalities of gait and mobility: Secondary | ICD-10-CM | POA: Diagnosis not present

## 2022-06-16 DIAGNOSIS — M62512 Muscle wasting and atrophy, not elsewhere classified, left shoulder: Secondary | ICD-10-CM | POA: Diagnosis not present

## 2022-06-17 DIAGNOSIS — M62512 Muscle wasting and atrophy, not elsewhere classified, left shoulder: Secondary | ICD-10-CM | POA: Diagnosis not present

## 2022-06-17 DIAGNOSIS — M62551 Muscle wasting and atrophy, not elsewhere classified, right thigh: Secondary | ICD-10-CM | POA: Diagnosis not present

## 2022-06-17 DIAGNOSIS — R488 Other symbolic dysfunctions: Secondary | ICD-10-CM | POA: Diagnosis not present

## 2022-06-17 DIAGNOSIS — M62511 Muscle wasting and atrophy, not elsewhere classified, right shoulder: Secondary | ICD-10-CM | POA: Diagnosis not present

## 2022-06-17 DIAGNOSIS — M62552 Muscle wasting and atrophy, not elsewhere classified, left thigh: Secondary | ICD-10-CM | POA: Diagnosis not present

## 2022-06-17 DIAGNOSIS — R2689 Other abnormalities of gait and mobility: Secondary | ICD-10-CM | POA: Diagnosis not present

## 2022-06-20 DIAGNOSIS — I10 Essential (primary) hypertension: Secondary | ICD-10-CM | POA: Diagnosis not present

## 2022-06-20 DIAGNOSIS — J309 Allergic rhinitis, unspecified: Secondary | ICD-10-CM | POA: Diagnosis not present

## 2022-06-20 DIAGNOSIS — F0280 Dementia in other diseases classified elsewhere without behavioral disturbance: Secondary | ICD-10-CM | POA: Diagnosis not present

## 2022-06-20 DIAGNOSIS — E559 Vitamin D deficiency, unspecified: Secondary | ICD-10-CM | POA: Diagnosis not present

## 2022-06-20 DIAGNOSIS — G4089 Other seizures: Secondary | ICD-10-CM | POA: Diagnosis not present

## 2022-06-20 DIAGNOSIS — E785 Hyperlipidemia, unspecified: Secondary | ICD-10-CM | POA: Diagnosis not present

## 2022-06-21 DIAGNOSIS — F419 Anxiety disorder, unspecified: Secondary | ICD-10-CM | POA: Diagnosis not present

## 2022-06-21 DIAGNOSIS — I10 Essential (primary) hypertension: Secondary | ICD-10-CM | POA: Diagnosis not present

## 2022-06-21 DIAGNOSIS — G4089 Other seizures: Secondary | ICD-10-CM | POA: Diagnosis not present

## 2022-06-21 DIAGNOSIS — G301 Alzheimer's disease with late onset: Secondary | ICD-10-CM | POA: Diagnosis not present

## 2022-06-21 DIAGNOSIS — F331 Major depressive disorder, recurrent, moderate: Secondary | ICD-10-CM | POA: Diagnosis not present

## 2022-06-21 DIAGNOSIS — F0280 Dementia in other diseases classified elsewhere without behavioral disturbance: Secondary | ICD-10-CM | POA: Diagnosis not present

## 2022-06-27 DIAGNOSIS — M62551 Muscle wasting and atrophy, not elsewhere classified, right thigh: Secondary | ICD-10-CM | POA: Diagnosis not present

## 2022-06-27 DIAGNOSIS — M62552 Muscle wasting and atrophy, not elsewhere classified, left thigh: Secondary | ICD-10-CM | POA: Diagnosis not present

## 2022-06-27 DIAGNOSIS — R488 Other symbolic dysfunctions: Secondary | ICD-10-CM | POA: Diagnosis not present

## 2022-06-27 DIAGNOSIS — R2689 Other abnormalities of gait and mobility: Secondary | ICD-10-CM | POA: Diagnosis not present

## 2022-06-29 DIAGNOSIS — M62552 Muscle wasting and atrophy, not elsewhere classified, left thigh: Secondary | ICD-10-CM | POA: Diagnosis not present

## 2022-06-29 DIAGNOSIS — R2689 Other abnormalities of gait and mobility: Secondary | ICD-10-CM | POA: Diagnosis not present

## 2022-06-29 DIAGNOSIS — M62551 Muscle wasting and atrophy, not elsewhere classified, right thigh: Secondary | ICD-10-CM | POA: Diagnosis not present

## 2022-06-29 DIAGNOSIS — R488 Other symbolic dysfunctions: Secondary | ICD-10-CM | POA: Diagnosis not present

## 2022-07-05 DIAGNOSIS — R488 Other symbolic dysfunctions: Secondary | ICD-10-CM | POA: Diagnosis not present

## 2022-07-05 DIAGNOSIS — M62551 Muscle wasting and atrophy, not elsewhere classified, right thigh: Secondary | ICD-10-CM | POA: Diagnosis not present

## 2022-07-05 DIAGNOSIS — M62552 Muscle wasting and atrophy, not elsewhere classified, left thigh: Secondary | ICD-10-CM | POA: Diagnosis not present

## 2022-07-05 DIAGNOSIS — R2689 Other abnormalities of gait and mobility: Secondary | ICD-10-CM | POA: Diagnosis not present

## 2022-07-06 DIAGNOSIS — R488 Other symbolic dysfunctions: Secondary | ICD-10-CM | POA: Diagnosis not present

## 2022-07-06 DIAGNOSIS — R2689 Other abnormalities of gait and mobility: Secondary | ICD-10-CM | POA: Diagnosis not present

## 2022-07-06 DIAGNOSIS — M62552 Muscle wasting and atrophy, not elsewhere classified, left thigh: Secondary | ICD-10-CM | POA: Diagnosis not present

## 2022-07-06 DIAGNOSIS — M62551 Muscle wasting and atrophy, not elsewhere classified, right thigh: Secondary | ICD-10-CM | POA: Diagnosis not present

## 2022-07-07 DIAGNOSIS — R2689 Other abnormalities of gait and mobility: Secondary | ICD-10-CM | POA: Diagnosis not present

## 2022-07-07 DIAGNOSIS — M62551 Muscle wasting and atrophy, not elsewhere classified, right thigh: Secondary | ICD-10-CM | POA: Diagnosis not present

## 2022-07-07 DIAGNOSIS — R488 Other symbolic dysfunctions: Secondary | ICD-10-CM | POA: Diagnosis not present

## 2022-07-07 DIAGNOSIS — M62552 Muscle wasting and atrophy, not elsewhere classified, left thigh: Secondary | ICD-10-CM | POA: Diagnosis not present

## 2022-07-11 DIAGNOSIS — F419 Anxiety disorder, unspecified: Secondary | ICD-10-CM | POA: Diagnosis not present

## 2022-07-11 DIAGNOSIS — I10 Essential (primary) hypertension: Secondary | ICD-10-CM | POA: Diagnosis not present

## 2022-07-11 DIAGNOSIS — G4089 Other seizures: Secondary | ICD-10-CM | POA: Diagnosis not present

## 2022-07-11 DIAGNOSIS — M62551 Muscle wasting and atrophy, not elsewhere classified, right thigh: Secondary | ICD-10-CM | POA: Diagnosis not present

## 2022-07-11 DIAGNOSIS — E785 Hyperlipidemia, unspecified: Secondary | ICD-10-CM | POA: Diagnosis not present

## 2022-07-11 DIAGNOSIS — E559 Vitamin D deficiency, unspecified: Secondary | ICD-10-CM | POA: Diagnosis not present

## 2022-07-11 DIAGNOSIS — M62552 Muscle wasting and atrophy, not elsewhere classified, left thigh: Secondary | ICD-10-CM | POA: Diagnosis not present

## 2022-07-11 DIAGNOSIS — F0280 Dementia in other diseases classified elsewhere without behavioral disturbance: Secondary | ICD-10-CM | POA: Diagnosis not present

## 2022-07-11 DIAGNOSIS — F331 Major depressive disorder, recurrent, moderate: Secondary | ICD-10-CM | POA: Diagnosis not present

## 2022-07-11 DIAGNOSIS — J309 Allergic rhinitis, unspecified: Secondary | ICD-10-CM | POA: Diagnosis not present

## 2022-07-11 DIAGNOSIS — R2689 Other abnormalities of gait and mobility: Secondary | ICD-10-CM | POA: Diagnosis not present

## 2022-07-11 DIAGNOSIS — R488 Other symbolic dysfunctions: Secondary | ICD-10-CM | POA: Diagnosis not present

## 2022-07-12 DIAGNOSIS — R488 Other symbolic dysfunctions: Secondary | ICD-10-CM | POA: Diagnosis not present

## 2022-07-12 DIAGNOSIS — M62552 Muscle wasting and atrophy, not elsewhere classified, left thigh: Secondary | ICD-10-CM | POA: Diagnosis not present

## 2022-07-12 DIAGNOSIS — R2689 Other abnormalities of gait and mobility: Secondary | ICD-10-CM | POA: Diagnosis not present

## 2022-07-12 DIAGNOSIS — F331 Major depressive disorder, recurrent, moderate: Secondary | ICD-10-CM | POA: Diagnosis not present

## 2022-07-12 DIAGNOSIS — F419 Anxiety disorder, unspecified: Secondary | ICD-10-CM | POA: Diagnosis not present

## 2022-07-12 DIAGNOSIS — M62551 Muscle wasting and atrophy, not elsewhere classified, right thigh: Secondary | ICD-10-CM | POA: Diagnosis not present

## 2022-07-12 DIAGNOSIS — F0390 Unspecified dementia without behavioral disturbance: Secondary | ICD-10-CM | POA: Diagnosis not present

## 2022-07-13 DIAGNOSIS — M62552 Muscle wasting and atrophy, not elsewhere classified, left thigh: Secondary | ICD-10-CM | POA: Diagnosis not present

## 2022-07-13 DIAGNOSIS — R2689 Other abnormalities of gait and mobility: Secondary | ICD-10-CM | POA: Diagnosis not present

## 2022-07-13 DIAGNOSIS — R488 Other symbolic dysfunctions: Secondary | ICD-10-CM | POA: Diagnosis not present

## 2022-07-13 DIAGNOSIS — M62551 Muscle wasting and atrophy, not elsewhere classified, right thigh: Secondary | ICD-10-CM | POA: Diagnosis not present

## 2022-07-15 DIAGNOSIS — M62552 Muscle wasting and atrophy, not elsewhere classified, left thigh: Secondary | ICD-10-CM | POA: Diagnosis not present

## 2022-07-15 DIAGNOSIS — R488 Other symbolic dysfunctions: Secondary | ICD-10-CM | POA: Diagnosis not present

## 2022-07-15 DIAGNOSIS — R2689 Other abnormalities of gait and mobility: Secondary | ICD-10-CM | POA: Diagnosis not present

## 2022-07-15 DIAGNOSIS — M62551 Muscle wasting and atrophy, not elsewhere classified, right thigh: Secondary | ICD-10-CM | POA: Diagnosis not present

## 2022-07-19 DIAGNOSIS — R2689 Other abnormalities of gait and mobility: Secondary | ICD-10-CM | POA: Diagnosis not present

## 2022-07-19 DIAGNOSIS — B351 Tinea unguium: Secondary | ICD-10-CM | POA: Diagnosis not present

## 2022-07-19 DIAGNOSIS — M79675 Pain in left toe(s): Secondary | ICD-10-CM | POA: Diagnosis not present

## 2022-07-19 DIAGNOSIS — M62552 Muscle wasting and atrophy, not elsewhere classified, left thigh: Secondary | ICD-10-CM | POA: Diagnosis not present

## 2022-07-19 DIAGNOSIS — M62551 Muscle wasting and atrophy, not elsewhere classified, right thigh: Secondary | ICD-10-CM | POA: Diagnosis not present

## 2022-07-19 DIAGNOSIS — M79674 Pain in right toe(s): Secondary | ICD-10-CM | POA: Diagnosis not present

## 2022-07-19 DIAGNOSIS — R488 Other symbolic dysfunctions: Secondary | ICD-10-CM | POA: Diagnosis not present

## 2022-07-20 DIAGNOSIS — M62551 Muscle wasting and atrophy, not elsewhere classified, right thigh: Secondary | ICD-10-CM | POA: Diagnosis not present

## 2022-07-20 DIAGNOSIS — R488 Other symbolic dysfunctions: Secondary | ICD-10-CM | POA: Diagnosis not present

## 2022-07-20 DIAGNOSIS — R2689 Other abnormalities of gait and mobility: Secondary | ICD-10-CM | POA: Diagnosis not present

## 2022-07-20 DIAGNOSIS — M62552 Muscle wasting and atrophy, not elsewhere classified, left thigh: Secondary | ICD-10-CM | POA: Diagnosis not present

## 2022-07-21 DIAGNOSIS — R488 Other symbolic dysfunctions: Secondary | ICD-10-CM | POA: Diagnosis not present

## 2022-07-21 DIAGNOSIS — M62552 Muscle wasting and atrophy, not elsewhere classified, left thigh: Secondary | ICD-10-CM | POA: Diagnosis not present

## 2022-07-21 DIAGNOSIS — M62551 Muscle wasting and atrophy, not elsewhere classified, right thigh: Secondary | ICD-10-CM | POA: Diagnosis not present

## 2022-07-21 DIAGNOSIS — R2689 Other abnormalities of gait and mobility: Secondary | ICD-10-CM | POA: Diagnosis not present

## 2022-07-22 DIAGNOSIS — R2689 Other abnormalities of gait and mobility: Secondary | ICD-10-CM | POA: Diagnosis not present

## 2022-07-22 DIAGNOSIS — M62552 Muscle wasting and atrophy, not elsewhere classified, left thigh: Secondary | ICD-10-CM | POA: Diagnosis not present

## 2022-07-22 DIAGNOSIS — R488 Other symbolic dysfunctions: Secondary | ICD-10-CM | POA: Diagnosis not present

## 2022-07-22 DIAGNOSIS — M62551 Muscle wasting and atrophy, not elsewhere classified, right thigh: Secondary | ICD-10-CM | POA: Diagnosis not present

## 2022-07-25 DIAGNOSIS — M62551 Muscle wasting and atrophy, not elsewhere classified, right thigh: Secondary | ICD-10-CM | POA: Diagnosis not present

## 2022-07-25 DIAGNOSIS — R488 Other symbolic dysfunctions: Secondary | ICD-10-CM | POA: Diagnosis not present

## 2022-07-25 DIAGNOSIS — R2689 Other abnormalities of gait and mobility: Secondary | ICD-10-CM | POA: Diagnosis not present

## 2022-07-25 DIAGNOSIS — M62552 Muscle wasting and atrophy, not elsewhere classified, left thigh: Secondary | ICD-10-CM | POA: Diagnosis not present

## 2022-07-26 DIAGNOSIS — M62551 Muscle wasting and atrophy, not elsewhere classified, right thigh: Secondary | ICD-10-CM | POA: Diagnosis not present

## 2022-07-26 DIAGNOSIS — M62552 Muscle wasting and atrophy, not elsewhere classified, left thigh: Secondary | ICD-10-CM | POA: Diagnosis not present

## 2022-07-26 DIAGNOSIS — R2689 Other abnormalities of gait and mobility: Secondary | ICD-10-CM | POA: Diagnosis not present

## 2022-07-26 DIAGNOSIS — R488 Other symbolic dysfunctions: Secondary | ICD-10-CM | POA: Diagnosis not present

## 2022-07-28 DIAGNOSIS — R2689 Other abnormalities of gait and mobility: Secondary | ICD-10-CM | POA: Diagnosis not present

## 2022-07-28 DIAGNOSIS — R488 Other symbolic dysfunctions: Secondary | ICD-10-CM | POA: Diagnosis not present

## 2022-07-28 DIAGNOSIS — M62551 Muscle wasting and atrophy, not elsewhere classified, right thigh: Secondary | ICD-10-CM | POA: Diagnosis not present

## 2022-07-28 DIAGNOSIS — M62552 Muscle wasting and atrophy, not elsewhere classified, left thigh: Secondary | ICD-10-CM | POA: Diagnosis not present

## 2022-07-30 DIAGNOSIS — M62552 Muscle wasting and atrophy, not elsewhere classified, left thigh: Secondary | ICD-10-CM | POA: Diagnosis not present

## 2022-07-30 DIAGNOSIS — R2689 Other abnormalities of gait and mobility: Secondary | ICD-10-CM | POA: Diagnosis not present

## 2022-07-30 DIAGNOSIS — R488 Other symbolic dysfunctions: Secondary | ICD-10-CM | POA: Diagnosis not present

## 2022-07-30 DIAGNOSIS — M62551 Muscle wasting and atrophy, not elsewhere classified, right thigh: Secondary | ICD-10-CM | POA: Diagnosis not present

## 2022-08-01 DIAGNOSIS — M62551 Muscle wasting and atrophy, not elsewhere classified, right thigh: Secondary | ICD-10-CM | POA: Diagnosis not present

## 2022-08-01 DIAGNOSIS — M62552 Muscle wasting and atrophy, not elsewhere classified, left thigh: Secondary | ICD-10-CM | POA: Diagnosis not present

## 2022-08-01 DIAGNOSIS — R2689 Other abnormalities of gait and mobility: Secondary | ICD-10-CM | POA: Diagnosis not present

## 2022-08-01 DIAGNOSIS — R488 Other symbolic dysfunctions: Secondary | ICD-10-CM | POA: Diagnosis not present

## 2022-08-02 DIAGNOSIS — R2689 Other abnormalities of gait and mobility: Secondary | ICD-10-CM | POA: Diagnosis not present

## 2022-08-02 DIAGNOSIS — M62552 Muscle wasting and atrophy, not elsewhere classified, left thigh: Secondary | ICD-10-CM | POA: Diagnosis not present

## 2022-08-02 DIAGNOSIS — M62551 Muscle wasting and atrophy, not elsewhere classified, right thigh: Secondary | ICD-10-CM | POA: Diagnosis not present

## 2022-08-02 DIAGNOSIS — R488 Other symbolic dysfunctions: Secondary | ICD-10-CM | POA: Diagnosis not present

## 2022-08-04 DIAGNOSIS — M62552 Muscle wasting and atrophy, not elsewhere classified, left thigh: Secondary | ICD-10-CM | POA: Diagnosis not present

## 2022-08-04 DIAGNOSIS — M62551 Muscle wasting and atrophy, not elsewhere classified, right thigh: Secondary | ICD-10-CM | POA: Diagnosis not present

## 2022-08-04 DIAGNOSIS — R2689 Other abnormalities of gait and mobility: Secondary | ICD-10-CM | POA: Diagnosis not present

## 2022-08-04 DIAGNOSIS — R488 Other symbolic dysfunctions: Secondary | ICD-10-CM | POA: Diagnosis not present

## 2022-08-08 DIAGNOSIS — J309 Allergic rhinitis, unspecified: Secondary | ICD-10-CM | POA: Diagnosis not present

## 2022-08-08 DIAGNOSIS — I10 Essential (primary) hypertension: Secondary | ICD-10-CM | POA: Diagnosis not present

## 2022-08-08 DIAGNOSIS — F0394 Unspecified dementia, unspecified severity, with anxiety: Secondary | ICD-10-CM | POA: Diagnosis not present

## 2022-08-08 DIAGNOSIS — M62551 Muscle wasting and atrophy, not elsewhere classified, right thigh: Secondary | ICD-10-CM | POA: Diagnosis not present

## 2022-08-08 DIAGNOSIS — M62552 Muscle wasting and atrophy, not elsewhere classified, left thigh: Secondary | ICD-10-CM | POA: Diagnosis not present

## 2022-08-08 DIAGNOSIS — R2689 Other abnormalities of gait and mobility: Secondary | ICD-10-CM | POA: Diagnosis not present

## 2022-08-08 DIAGNOSIS — E559 Vitamin D deficiency, unspecified: Secondary | ICD-10-CM | POA: Diagnosis not present

## 2022-08-08 DIAGNOSIS — R488 Other symbolic dysfunctions: Secondary | ICD-10-CM | POA: Diagnosis not present

## 2022-08-08 DIAGNOSIS — G4089 Other seizures: Secondary | ICD-10-CM | POA: Diagnosis not present

## 2022-08-09 DIAGNOSIS — F419 Anxiety disorder, unspecified: Secondary | ICD-10-CM | POA: Diagnosis not present

## 2022-08-09 DIAGNOSIS — F331 Major depressive disorder, recurrent, moderate: Secondary | ICD-10-CM | POA: Diagnosis not present

## 2022-08-09 DIAGNOSIS — F0390 Unspecified dementia without behavioral disturbance: Secondary | ICD-10-CM | POA: Diagnosis not present

## 2022-08-11 DIAGNOSIS — R2689 Other abnormalities of gait and mobility: Secondary | ICD-10-CM | POA: Diagnosis not present

## 2022-08-11 DIAGNOSIS — R488 Other symbolic dysfunctions: Secondary | ICD-10-CM | POA: Diagnosis not present

## 2022-08-11 DIAGNOSIS — M62552 Muscle wasting and atrophy, not elsewhere classified, left thigh: Secondary | ICD-10-CM | POA: Diagnosis not present

## 2022-08-11 DIAGNOSIS — M62551 Muscle wasting and atrophy, not elsewhere classified, right thigh: Secondary | ICD-10-CM | POA: Diagnosis not present

## 2022-08-15 DIAGNOSIS — R488 Other symbolic dysfunctions: Secondary | ICD-10-CM | POA: Diagnosis not present

## 2022-08-15 DIAGNOSIS — R2689 Other abnormalities of gait and mobility: Secondary | ICD-10-CM | POA: Diagnosis not present

## 2022-08-15 DIAGNOSIS — M62551 Muscle wasting and atrophy, not elsewhere classified, right thigh: Secondary | ICD-10-CM | POA: Diagnosis not present

## 2022-08-15 DIAGNOSIS — M62552 Muscle wasting and atrophy, not elsewhere classified, left thigh: Secondary | ICD-10-CM | POA: Diagnosis not present

## 2022-08-17 DIAGNOSIS — M62552 Muscle wasting and atrophy, not elsewhere classified, left thigh: Secondary | ICD-10-CM | POA: Diagnosis not present

## 2022-08-17 DIAGNOSIS — M62551 Muscle wasting and atrophy, not elsewhere classified, right thigh: Secondary | ICD-10-CM | POA: Diagnosis not present

## 2022-08-17 DIAGNOSIS — R488 Other symbolic dysfunctions: Secondary | ICD-10-CM | POA: Diagnosis not present

## 2022-08-17 DIAGNOSIS — R2689 Other abnormalities of gait and mobility: Secondary | ICD-10-CM | POA: Diagnosis not present

## 2022-08-18 DIAGNOSIS — M62551 Muscle wasting and atrophy, not elsewhere classified, right thigh: Secondary | ICD-10-CM | POA: Diagnosis not present

## 2022-08-18 DIAGNOSIS — M62552 Muscle wasting and atrophy, not elsewhere classified, left thigh: Secondary | ICD-10-CM | POA: Diagnosis not present

## 2022-08-18 DIAGNOSIS — R2689 Other abnormalities of gait and mobility: Secondary | ICD-10-CM | POA: Diagnosis not present

## 2022-08-18 DIAGNOSIS — R488 Other symbolic dysfunctions: Secondary | ICD-10-CM | POA: Diagnosis not present

## 2022-08-19 DIAGNOSIS — R2689 Other abnormalities of gait and mobility: Secondary | ICD-10-CM | POA: Diagnosis not present

## 2022-08-19 DIAGNOSIS — R488 Other symbolic dysfunctions: Secondary | ICD-10-CM | POA: Diagnosis not present

## 2022-08-19 DIAGNOSIS — M62551 Muscle wasting and atrophy, not elsewhere classified, right thigh: Secondary | ICD-10-CM | POA: Diagnosis not present

## 2022-08-19 DIAGNOSIS — M62552 Muscle wasting and atrophy, not elsewhere classified, left thigh: Secondary | ICD-10-CM | POA: Diagnosis not present

## 2022-08-24 DIAGNOSIS — R2689 Other abnormalities of gait and mobility: Secondary | ICD-10-CM | POA: Diagnosis not present

## 2022-08-24 DIAGNOSIS — M62552 Muscle wasting and atrophy, not elsewhere classified, left thigh: Secondary | ICD-10-CM | POA: Diagnosis not present

## 2022-08-24 DIAGNOSIS — M62551 Muscle wasting and atrophy, not elsewhere classified, right thigh: Secondary | ICD-10-CM | POA: Diagnosis not present

## 2022-08-27 DIAGNOSIS — M62551 Muscle wasting and atrophy, not elsewhere classified, right thigh: Secondary | ICD-10-CM | POA: Diagnosis not present

## 2022-08-27 DIAGNOSIS — R2689 Other abnormalities of gait and mobility: Secondary | ICD-10-CM | POA: Diagnosis not present

## 2022-08-27 DIAGNOSIS — M62552 Muscle wasting and atrophy, not elsewhere classified, left thigh: Secondary | ICD-10-CM | POA: Diagnosis not present

## 2022-08-29 DIAGNOSIS — J309 Allergic rhinitis, unspecified: Secondary | ICD-10-CM | POA: Diagnosis not present

## 2022-08-29 DIAGNOSIS — G4089 Other seizures: Secondary | ICD-10-CM | POA: Diagnosis not present

## 2022-08-29 DIAGNOSIS — F0394 Unspecified dementia, unspecified severity, with anxiety: Secondary | ICD-10-CM | POA: Diagnosis not present

## 2022-08-29 DIAGNOSIS — I10 Essential (primary) hypertension: Secondary | ICD-10-CM | POA: Diagnosis not present

## 2022-08-29 DIAGNOSIS — E559 Vitamin D deficiency, unspecified: Secondary | ICD-10-CM | POA: Diagnosis not present

## 2022-08-29 DIAGNOSIS — E785 Hyperlipidemia, unspecified: Secondary | ICD-10-CM | POA: Diagnosis not present

## 2022-08-31 DIAGNOSIS — M62551 Muscle wasting and atrophy, not elsewhere classified, right thigh: Secondary | ICD-10-CM | POA: Diagnosis not present

## 2022-08-31 DIAGNOSIS — R2689 Other abnormalities of gait and mobility: Secondary | ICD-10-CM | POA: Diagnosis not present

## 2022-08-31 DIAGNOSIS — M62552 Muscle wasting and atrophy, not elsewhere classified, left thigh: Secondary | ICD-10-CM | POA: Diagnosis not present

## 2022-09-06 DIAGNOSIS — F0390 Unspecified dementia without behavioral disturbance: Secondary | ICD-10-CM | POA: Diagnosis not present

## 2022-09-06 DIAGNOSIS — F331 Major depressive disorder, recurrent, moderate: Secondary | ICD-10-CM | POA: Diagnosis not present

## 2022-09-06 DIAGNOSIS — F411 Generalized anxiety disorder: Secondary | ICD-10-CM | POA: Diagnosis not present

## 2022-09-08 DIAGNOSIS — R2689 Other abnormalities of gait and mobility: Secondary | ICD-10-CM | POA: Diagnosis not present

## 2022-09-08 DIAGNOSIS — M62551 Muscle wasting and atrophy, not elsewhere classified, right thigh: Secondary | ICD-10-CM | POA: Diagnosis not present

## 2022-09-08 DIAGNOSIS — M62552 Muscle wasting and atrophy, not elsewhere classified, left thigh: Secondary | ICD-10-CM | POA: Diagnosis not present

## 2022-09-21 DIAGNOSIS — F411 Generalized anxiety disorder: Secondary | ICD-10-CM | POA: Diagnosis not present

## 2022-09-21 DIAGNOSIS — F331 Major depressive disorder, recurrent, moderate: Secondary | ICD-10-CM | POA: Diagnosis not present

## 2022-09-24 DIAGNOSIS — M62552 Muscle wasting and atrophy, not elsewhere classified, left thigh: Secondary | ICD-10-CM | POA: Diagnosis not present

## 2022-09-24 DIAGNOSIS — R2689 Other abnormalities of gait and mobility: Secondary | ICD-10-CM | POA: Diagnosis not present

## 2022-09-24 DIAGNOSIS — M62551 Muscle wasting and atrophy, not elsewhere classified, right thigh: Secondary | ICD-10-CM | POA: Diagnosis not present

## 2022-09-26 DIAGNOSIS — I10 Essential (primary) hypertension: Secondary | ICD-10-CM | POA: Diagnosis not present

## 2022-09-26 DIAGNOSIS — E785 Hyperlipidemia, unspecified: Secondary | ICD-10-CM | POA: Diagnosis not present

## 2022-09-26 DIAGNOSIS — F0280 Dementia in other diseases classified elsewhere without behavioral disturbance: Secondary | ICD-10-CM | POA: Diagnosis not present

## 2022-09-26 DIAGNOSIS — E559 Vitamin D deficiency, unspecified: Secondary | ICD-10-CM | POA: Diagnosis not present

## 2022-09-26 DIAGNOSIS — J309 Allergic rhinitis, unspecified: Secondary | ICD-10-CM | POA: Diagnosis not present

## 2022-09-26 DIAGNOSIS — G4089 Other seizures: Secondary | ICD-10-CM | POA: Diagnosis not present

## 2022-09-26 DIAGNOSIS — R350 Frequency of micturition: Secondary | ICD-10-CM | POA: Diagnosis not present

## 2022-09-27 DIAGNOSIS — M62551 Muscle wasting and atrophy, not elsewhere classified, right thigh: Secondary | ICD-10-CM | POA: Diagnosis not present

## 2022-09-27 DIAGNOSIS — R2689 Other abnormalities of gait and mobility: Secondary | ICD-10-CM | POA: Diagnosis not present

## 2022-09-27 DIAGNOSIS — M62552 Muscle wasting and atrophy, not elsewhere classified, left thigh: Secondary | ICD-10-CM | POA: Diagnosis not present

## 2022-09-29 DIAGNOSIS — E559 Vitamin D deficiency, unspecified: Secondary | ICD-10-CM | POA: Diagnosis not present

## 2022-09-29 DIAGNOSIS — I10 Essential (primary) hypertension: Secondary | ICD-10-CM | POA: Diagnosis not present

## 2022-09-29 DIAGNOSIS — G4089 Other seizures: Secondary | ICD-10-CM | POA: Diagnosis not present

## 2022-09-29 DIAGNOSIS — E785 Hyperlipidemia, unspecified: Secondary | ICD-10-CM | POA: Diagnosis not present

## 2022-10-04 DIAGNOSIS — F411 Generalized anxiety disorder: Secondary | ICD-10-CM | POA: Diagnosis not present

## 2022-10-04 DIAGNOSIS — F339 Major depressive disorder, recurrent, unspecified: Secondary | ICD-10-CM | POA: Diagnosis not present

## 2022-10-04 DIAGNOSIS — F0390 Unspecified dementia without behavioral disturbance: Secondary | ICD-10-CM | POA: Diagnosis not present

## 2022-10-21 DIAGNOSIS — F331 Major depressive disorder, recurrent, moderate: Secondary | ICD-10-CM | POA: Diagnosis not present

## 2022-10-21 DIAGNOSIS — F411 Generalized anxiety disorder: Secondary | ICD-10-CM | POA: Diagnosis not present

## 2022-10-24 DIAGNOSIS — G4089 Other seizures: Secondary | ICD-10-CM | POA: Diagnosis not present

## 2022-10-24 DIAGNOSIS — J309 Allergic rhinitis, unspecified: Secondary | ICD-10-CM | POA: Diagnosis not present

## 2022-10-24 DIAGNOSIS — E785 Hyperlipidemia, unspecified: Secondary | ICD-10-CM | POA: Diagnosis not present

## 2022-10-24 DIAGNOSIS — F0280 Dementia in other diseases classified elsewhere without behavioral disturbance: Secondary | ICD-10-CM | POA: Diagnosis not present

## 2022-10-24 DIAGNOSIS — I10 Essential (primary) hypertension: Secondary | ICD-10-CM | POA: Diagnosis not present

## 2022-10-24 DIAGNOSIS — E559 Vitamin D deficiency, unspecified: Secondary | ICD-10-CM | POA: Diagnosis not present

## 2022-10-25 DIAGNOSIS — M79674 Pain in right toe(s): Secondary | ICD-10-CM | POA: Diagnosis not present

## 2022-10-25 DIAGNOSIS — B351 Tinea unguium: Secondary | ICD-10-CM | POA: Diagnosis not present

## 2022-10-25 DIAGNOSIS — M79675 Pain in left toe(s): Secondary | ICD-10-CM | POA: Diagnosis not present

## 2022-11-01 DIAGNOSIS — F0390 Unspecified dementia without behavioral disturbance: Secondary | ICD-10-CM | POA: Diagnosis not present

## 2022-11-01 DIAGNOSIS — G40909 Epilepsy, unspecified, not intractable, without status epilepticus: Secondary | ICD-10-CM | POA: Diagnosis not present

## 2022-11-01 DIAGNOSIS — F331 Major depressive disorder, recurrent, moderate: Secondary | ICD-10-CM | POA: Diagnosis not present

## 2022-11-01 DIAGNOSIS — F411 Generalized anxiety disorder: Secondary | ICD-10-CM | POA: Diagnosis not present

## 2022-11-22 DIAGNOSIS — F329 Major depressive disorder, single episode, unspecified: Secondary | ICD-10-CM | POA: Diagnosis not present

## 2022-11-22 DIAGNOSIS — E785 Hyperlipidemia, unspecified: Secondary | ICD-10-CM | POA: Diagnosis not present

## 2022-11-29 DIAGNOSIS — F339 Major depressive disorder, recurrent, unspecified: Secondary | ICD-10-CM | POA: Diagnosis not present

## 2022-11-29 DIAGNOSIS — F0390 Unspecified dementia without behavioral disturbance: Secondary | ICD-10-CM | POA: Diagnosis not present

## 2022-11-29 DIAGNOSIS — F411 Generalized anxiety disorder: Secondary | ICD-10-CM | POA: Diagnosis not present

## 2022-11-29 DIAGNOSIS — G8929 Other chronic pain: Secondary | ICD-10-CM | POA: Diagnosis not present

## 2022-12-05 DIAGNOSIS — F0280 Dementia in other diseases classified elsewhere without behavioral disturbance: Secondary | ICD-10-CM | POA: Diagnosis not present

## 2022-12-05 DIAGNOSIS — R3915 Urgency of urination: Secondary | ICD-10-CM | POA: Diagnosis not present

## 2022-12-05 DIAGNOSIS — G4089 Other seizures: Secondary | ICD-10-CM | POA: Diagnosis not present

## 2022-12-05 DIAGNOSIS — E559 Vitamin D deficiency, unspecified: Secondary | ICD-10-CM | POA: Diagnosis not present

## 2022-12-05 DIAGNOSIS — E785 Hyperlipidemia, unspecified: Secondary | ICD-10-CM | POA: Diagnosis not present

## 2022-12-05 DIAGNOSIS — I10 Essential (primary) hypertension: Secondary | ICD-10-CM | POA: Diagnosis not present

## 2022-12-05 DIAGNOSIS — J309 Allergic rhinitis, unspecified: Secondary | ICD-10-CM | POA: Diagnosis not present

## 2022-12-08 DIAGNOSIS — R350 Frequency of micturition: Secondary | ICD-10-CM | POA: Diagnosis not present

## 2022-12-08 DIAGNOSIS — I10 Essential (primary) hypertension: Secondary | ICD-10-CM | POA: Diagnosis not present

## 2022-12-08 DIAGNOSIS — E559 Vitamin D deficiency, unspecified: Secondary | ICD-10-CM | POA: Diagnosis not present

## 2022-12-08 DIAGNOSIS — E785 Hyperlipidemia, unspecified: Secondary | ICD-10-CM | POA: Diagnosis not present

## 2022-12-12 DIAGNOSIS — G4089 Other seizures: Secondary | ICD-10-CM | POA: Diagnosis not present

## 2022-12-12 DIAGNOSIS — E559 Vitamin D deficiency, unspecified: Secondary | ICD-10-CM | POA: Diagnosis not present

## 2022-12-12 DIAGNOSIS — I10 Essential (primary) hypertension: Secondary | ICD-10-CM | POA: Diagnosis not present

## 2022-12-12 DIAGNOSIS — E785 Hyperlipidemia, unspecified: Secondary | ICD-10-CM | POA: Diagnosis not present

## 2022-12-23 DIAGNOSIS — F329 Major depressive disorder, single episode, unspecified: Secondary | ICD-10-CM | POA: Diagnosis not present

## 2022-12-23 DIAGNOSIS — E785 Hyperlipidemia, unspecified: Secondary | ICD-10-CM | POA: Diagnosis not present

## 2022-12-27 DIAGNOSIS — F0390 Unspecified dementia without behavioral disturbance: Secondary | ICD-10-CM | POA: Diagnosis not present

## 2022-12-27 DIAGNOSIS — F411 Generalized anxiety disorder: Secondary | ICD-10-CM | POA: Diagnosis not present

## 2022-12-27 DIAGNOSIS — G8929 Other chronic pain: Secondary | ICD-10-CM | POA: Diagnosis not present

## 2022-12-27 DIAGNOSIS — F339 Major depressive disorder, recurrent, unspecified: Secondary | ICD-10-CM | POA: Diagnosis not present

## 2022-12-29 DIAGNOSIS — E559 Vitamin D deficiency, unspecified: Secondary | ICD-10-CM | POA: Diagnosis not present

## 2022-12-29 DIAGNOSIS — J309 Allergic rhinitis, unspecified: Secondary | ICD-10-CM | POA: Diagnosis not present

## 2022-12-29 DIAGNOSIS — G4089 Other seizures: Secondary | ICD-10-CM | POA: Diagnosis not present

## 2022-12-29 DIAGNOSIS — I10 Essential (primary) hypertension: Secondary | ICD-10-CM | POA: Diagnosis not present

## 2022-12-29 DIAGNOSIS — F0280 Dementia in other diseases classified elsewhere without behavioral disturbance: Secondary | ICD-10-CM | POA: Diagnosis not present

## 2022-12-29 DIAGNOSIS — E785 Hyperlipidemia, unspecified: Secondary | ICD-10-CM | POA: Diagnosis not present

## 2022-12-29 DIAGNOSIS — R3915 Urgency of urination: Secondary | ICD-10-CM | POA: Diagnosis not present

## 2023-01-16 DIAGNOSIS — I10 Essential (primary) hypertension: Secondary | ICD-10-CM | POA: Diagnosis not present

## 2023-01-16 DIAGNOSIS — F0280 Dementia in other diseases classified elsewhere without behavioral disturbance: Secondary | ICD-10-CM | POA: Diagnosis not present

## 2023-01-16 DIAGNOSIS — J309 Allergic rhinitis, unspecified: Secondary | ICD-10-CM | POA: Diagnosis not present

## 2023-01-16 DIAGNOSIS — R3915 Urgency of urination: Secondary | ICD-10-CM | POA: Diagnosis not present

## 2023-01-16 DIAGNOSIS — M5459 Other low back pain: Secondary | ICD-10-CM | POA: Diagnosis not present

## 2023-01-16 DIAGNOSIS — G4089 Other seizures: Secondary | ICD-10-CM | POA: Diagnosis not present

## 2023-01-16 DIAGNOSIS — E785 Hyperlipidemia, unspecified: Secondary | ICD-10-CM | POA: Diagnosis not present

## 2023-01-16 DIAGNOSIS — E559 Vitamin D deficiency, unspecified: Secondary | ICD-10-CM | POA: Diagnosis not present

## 2023-01-18 DIAGNOSIS — Z23 Encounter for immunization: Secondary | ICD-10-CM | POA: Diagnosis not present

## 2023-01-23 DIAGNOSIS — F329 Major depressive disorder, single episode, unspecified: Secondary | ICD-10-CM | POA: Diagnosis not present

## 2023-01-23 DIAGNOSIS — E785 Hyperlipidemia, unspecified: Secondary | ICD-10-CM | POA: Diagnosis not present

## 2023-01-24 DIAGNOSIS — G8929 Other chronic pain: Secondary | ICD-10-CM | POA: Diagnosis not present

## 2023-01-24 DIAGNOSIS — F411 Generalized anxiety disorder: Secondary | ICD-10-CM | POA: Diagnosis not present

## 2023-01-24 DIAGNOSIS — F0390 Unspecified dementia without behavioral disturbance: Secondary | ICD-10-CM | POA: Diagnosis not present

## 2023-01-24 DIAGNOSIS — F339 Major depressive disorder, recurrent, unspecified: Secondary | ICD-10-CM | POA: Diagnosis not present

## 2023-02-13 DIAGNOSIS — E559 Vitamin D deficiency, unspecified: Secondary | ICD-10-CM | POA: Diagnosis not present

## 2023-02-13 DIAGNOSIS — G4089 Other seizures: Secondary | ICD-10-CM | POA: Diagnosis not present

## 2023-02-13 DIAGNOSIS — I130 Hypertensive heart and chronic kidney disease with heart failure and stage 1 through stage 4 chronic kidney disease, or unspecified chronic kidney disease: Secondary | ICD-10-CM | POA: Diagnosis not present

## 2023-02-13 DIAGNOSIS — J309 Allergic rhinitis, unspecified: Secondary | ICD-10-CM | POA: Diagnosis not present

## 2023-02-13 DIAGNOSIS — F0390 Unspecified dementia without behavioral disturbance: Secondary | ICD-10-CM | POA: Diagnosis not present

## 2023-02-13 DIAGNOSIS — R350 Frequency of micturition: Secondary | ICD-10-CM | POA: Diagnosis not present

## 2023-02-13 DIAGNOSIS — N182 Chronic kidney disease, stage 2 (mild): Secondary | ICD-10-CM | POA: Diagnosis not present

## 2023-02-13 DIAGNOSIS — E785 Hyperlipidemia, unspecified: Secondary | ICD-10-CM | POA: Diagnosis not present

## 2023-02-20 DIAGNOSIS — F331 Major depressive disorder, recurrent, moderate: Secondary | ICD-10-CM | POA: Diagnosis not present

## 2023-02-20 DIAGNOSIS — F0390 Unspecified dementia without behavioral disturbance: Secondary | ICD-10-CM | POA: Diagnosis not present

## 2023-02-20 DIAGNOSIS — G8929 Other chronic pain: Secondary | ICD-10-CM | POA: Diagnosis not present

## 2023-02-20 DIAGNOSIS — F411 Generalized anxiety disorder: Secondary | ICD-10-CM | POA: Diagnosis not present

## 2023-03-18 ENCOUNTER — Ambulatory Visit: Payer: Self-pay | Admitting: Orthopaedic Surgery

## 2023-03-18 ENCOUNTER — Inpatient Hospital Stay (HOSPITAL_COMMUNITY)
Admission: EM | Admit: 2023-03-18 | Discharge: 2023-03-22 | DRG: 481 | Disposition: A | Discharge status: Skilled Nursing Facility | Payer: Medicare Other | Source: Skilled Nursing Facility | Attending: Internal Medicine | Admitting: Internal Medicine

## 2023-03-18 ENCOUNTER — Encounter (HOSPITAL_COMMUNITY): Payer: Self-pay

## 2023-03-18 ENCOUNTER — Other Ambulatory Visit: Payer: Self-pay

## 2023-03-18 ENCOUNTER — Emergency Department (HOSPITAL_COMMUNITY): Payer: Medicare Other

## 2023-03-18 DIAGNOSIS — Z7189 Other specified counseling: Secondary | ICD-10-CM

## 2023-03-18 DIAGNOSIS — S82892D Other fracture of left lower leg, subsequent encounter for closed fracture with routine healing: Secondary | ICD-10-CM | POA: Diagnosis not present

## 2023-03-18 DIAGNOSIS — E876 Hypokalemia: Secondary | ICD-10-CM

## 2023-03-18 DIAGNOSIS — F03911 Unspecified dementia, unspecified severity, with agitation: Secondary | ICD-10-CM | POA: Diagnosis present

## 2023-03-18 DIAGNOSIS — D62 Acute posthemorrhagic anemia: Secondary | ICD-10-CM | POA: Diagnosis not present

## 2023-03-18 DIAGNOSIS — F03918 Unspecified dementia, unspecified severity, with other behavioral disturbance: Secondary | ICD-10-CM | POA: Diagnosis not present

## 2023-03-18 DIAGNOSIS — Z79899 Other long term (current) drug therapy: Secondary | ICD-10-CM

## 2023-03-18 DIAGNOSIS — G2581 Restless legs syndrome: Secondary | ICD-10-CM | POA: Diagnosis present

## 2023-03-18 DIAGNOSIS — E785 Hyperlipidemia, unspecified: Secondary | ICD-10-CM | POA: Diagnosis present

## 2023-03-18 DIAGNOSIS — Z820 Family history of epilepsy and other diseases of the nervous system: Secondary | ICD-10-CM | POA: Diagnosis not present

## 2023-03-18 DIAGNOSIS — F32A Depression, unspecified: Secondary | ICD-10-CM | POA: Diagnosis present

## 2023-03-18 DIAGNOSIS — Z8249 Family history of ischemic heart disease and other diseases of the circulatory system: Secondary | ICD-10-CM | POA: Diagnosis not present

## 2023-03-18 DIAGNOSIS — Z8349 Family history of other endocrine, nutritional and metabolic diseases: Secondary | ICD-10-CM | POA: Diagnosis not present

## 2023-03-18 DIAGNOSIS — N390 Urinary tract infection, site not specified: Secondary | ICD-10-CM | POA: Diagnosis present

## 2023-03-18 DIAGNOSIS — S72002D Fracture of unspecified part of neck of left femur, subsequent encounter for closed fracture with routine healing: Secondary | ICD-10-CM | POA: Diagnosis not present

## 2023-03-18 DIAGNOSIS — G43909 Migraine, unspecified, not intractable, without status migrainosus: Secondary | ICD-10-CM | POA: Diagnosis present

## 2023-03-18 DIAGNOSIS — F0394 Unspecified dementia, unspecified severity, with anxiety: Secondary | ICD-10-CM | POA: Diagnosis present

## 2023-03-18 DIAGNOSIS — M79605 Pain in left leg: Secondary | ICD-10-CM | POA: Diagnosis not present

## 2023-03-18 DIAGNOSIS — K219 Gastro-esophageal reflux disease without esophagitis: Secondary | ICD-10-CM

## 2023-03-18 DIAGNOSIS — Z043 Encounter for examination and observation following other accident: Secondary | ICD-10-CM | POA: Diagnosis not present

## 2023-03-18 DIAGNOSIS — W010XXA Fall on same level from slipping, tripping and stumbling without subsequent striking against object, initial encounter: Secondary | ICD-10-CM | POA: Diagnosis present

## 2023-03-18 DIAGNOSIS — M810 Age-related osteoporosis without current pathological fracture: Secondary | ICD-10-CM | POA: Diagnosis present

## 2023-03-18 DIAGNOSIS — W19XXXA Unspecified fall, initial encounter: Secondary | ICD-10-CM | POA: Diagnosis not present

## 2023-03-18 DIAGNOSIS — S52502A Unspecified fracture of the lower end of left radius, initial encounter for closed fracture: Secondary | ICD-10-CM | POA: Diagnosis not present

## 2023-03-18 DIAGNOSIS — T8484XD Pain due to internal orthopedic prosthetic devices, implants and grafts, subsequent encounter: Secondary | ICD-10-CM | POA: Diagnosis not present

## 2023-03-18 DIAGNOSIS — Y92129 Unspecified place in nursing home as the place of occurrence of the external cause: Secondary | ICD-10-CM | POA: Diagnosis not present

## 2023-03-18 DIAGNOSIS — I10 Essential (primary) hypertension: Secondary | ICD-10-CM | POA: Diagnosis not present

## 2023-03-18 DIAGNOSIS — R41841 Cognitive communication deficit: Secondary | ICD-10-CM | POA: Diagnosis not present

## 2023-03-18 DIAGNOSIS — Z7901 Long term (current) use of anticoagulants: Secondary | ICD-10-CM | POA: Diagnosis not present

## 2023-03-18 DIAGNOSIS — E559 Vitamin D deficiency, unspecified: Secondary | ICD-10-CM | POA: Diagnosis present

## 2023-03-18 DIAGNOSIS — S52502D Unspecified fracture of the lower end of left radius, subsequent encounter for closed fracture with routine healing: Secondary | ICD-10-CM | POA: Diagnosis not present

## 2023-03-18 DIAGNOSIS — Z8711 Personal history of peptic ulcer disease: Secondary | ICD-10-CM

## 2023-03-18 DIAGNOSIS — Z882 Allergy status to sulfonamides status: Secondary | ICD-10-CM

## 2023-03-18 DIAGNOSIS — G40909 Epilepsy, unspecified, not intractable, without status epilepticus: Secondary | ICD-10-CM | POA: Diagnosis present

## 2023-03-18 DIAGNOSIS — S72002A Fracture of unspecified part of neck of left femur, initial encounter for closed fracture: Secondary | ICD-10-CM

## 2023-03-18 DIAGNOSIS — R0902 Hypoxemia: Secondary | ICD-10-CM | POA: Diagnosis not present

## 2023-03-18 DIAGNOSIS — S72042D Displaced fracture of base of neck of left femur, subsequent encounter for closed fracture with routine healing: Secondary | ICD-10-CM | POA: Diagnosis not present

## 2023-03-18 DIAGNOSIS — S72012A Unspecified intracapsular fracture of left femur, initial encounter for closed fracture: Secondary | ICD-10-CM | POA: Diagnosis present

## 2023-03-18 DIAGNOSIS — D72829 Elevated white blood cell count, unspecified: Secondary | ICD-10-CM

## 2023-03-18 DIAGNOSIS — Z01818 Encounter for other preprocedural examination: Secondary | ICD-10-CM | POA: Diagnosis not present

## 2023-03-18 DIAGNOSIS — S72042A Displaced fracture of base of neck of left femur, initial encounter for closed fracture: Secondary | ICD-10-CM | POA: Diagnosis not present

## 2023-03-18 DIAGNOSIS — M6281 Muscle weakness (generalized): Secondary | ICD-10-CM | POA: Diagnosis not present

## 2023-03-18 DIAGNOSIS — M85862 Other specified disorders of bone density and structure, left lower leg: Secondary | ICD-10-CM | POA: Diagnosis not present

## 2023-03-18 DIAGNOSIS — S62102A Fracture of unspecified carpal bone, left wrist, initial encounter for closed fracture: Secondary | ICD-10-CM

## 2023-03-18 DIAGNOSIS — M1712 Unilateral primary osteoarthritis, left knee: Secondary | ICD-10-CM | POA: Diagnosis not present

## 2023-03-18 DIAGNOSIS — F419 Anxiety disorder, unspecified: Secondary | ICD-10-CM

## 2023-03-18 DIAGNOSIS — E871 Hypo-osmolality and hyponatremia: Secondary | ICD-10-CM

## 2023-03-18 DIAGNOSIS — R609 Edema, unspecified: Secondary | ICD-10-CM | POA: Diagnosis not present

## 2023-03-18 DIAGNOSIS — F0390 Unspecified dementia without behavioral disturbance: Secondary | ICD-10-CM

## 2023-03-18 DIAGNOSIS — R2689 Other abnormalities of gait and mobility: Secondary | ICD-10-CM | POA: Diagnosis not present

## 2023-03-18 DIAGNOSIS — R457 State of emotional shock and stress, unspecified: Secondary | ICD-10-CM | POA: Diagnosis not present

## 2023-03-18 DIAGNOSIS — J952 Acute pulmonary insufficiency following nonthoracic surgery: Secondary | ICD-10-CM | POA: Diagnosis not present

## 2023-03-18 DIAGNOSIS — R410 Disorientation, unspecified: Secondary | ICD-10-CM | POA: Diagnosis not present

## 2023-03-18 DIAGNOSIS — M8580 Other specified disorders of bone density and structure, unspecified site: Secondary | ICD-10-CM | POA: Diagnosis not present

## 2023-03-18 DIAGNOSIS — F0393 Unspecified dementia, unspecified severity, with mood disturbance: Secondary | ICD-10-CM | POA: Diagnosis present

## 2023-03-18 DIAGNOSIS — R1312 Dysphagia, oropharyngeal phase: Secondary | ICD-10-CM | POA: Diagnosis not present

## 2023-03-18 DIAGNOSIS — Z7401 Bed confinement status: Secondary | ICD-10-CM | POA: Diagnosis not present

## 2023-03-18 DIAGNOSIS — S52322A Displaced transverse fracture of shaft of left radius, initial encounter for closed fracture: Secondary | ICD-10-CM | POA: Diagnosis not present

## 2023-03-18 DIAGNOSIS — F418 Other specified anxiety disorders: Secondary | ICD-10-CM | POA: Diagnosis not present

## 2023-03-18 LAB — TROPONIN I (HIGH SENSITIVITY): Troponin I (High Sensitivity): 4 ng/L (ref <18)

## 2023-03-18 LAB — CBC WITH DIFFERENTIAL/PLATELET
Abs Immature Granulocytes: 0.1 10*3/uL — ABNORMAL HIGH (ref 0.00–0.07)
Basophils Absolute: 0 10*3/uL (ref 0.0–0.1)
Basophils Relative: 0 %
Eosinophils Absolute: 0 10*3/uL (ref 0.0–0.5)
Eosinophils Relative: 0 %
HCT: 39.5 % (ref 36.0–46.0)
Hemoglobin: 13.3 g/dL (ref 12.0–15.0)
Immature Granulocytes: 1 %
Lymphocytes Relative: 10 %
Lymphs Abs: 1.4 10*3/uL (ref 0.7–4.0)
MCH: 28.9 pg (ref 26.0–34.0)
MCHC: 33.7 g/dL (ref 30.0–36.0)
MCV: 85.9 fL (ref 80.0–100.0)
Monocytes Absolute: 0.7 10*3/uL (ref 0.1–1.0)
Monocytes Relative: 5 %
Neutro Abs: 12 10*3/uL — ABNORMAL HIGH (ref 1.7–7.7)
Neutrophils Relative %: 84 %
Platelets: 237 10*3/uL (ref 150–400)
RBC: 4.6 MIL/uL (ref 3.87–5.11)
RDW: 13.4 % (ref 11.5–15.5)
WBC: 14.3 10*3/uL — ABNORMAL HIGH (ref 4.0–10.5)
nRBC: 0 % (ref 0.0–0.2)

## 2023-03-18 LAB — BASIC METABOLIC PANEL
Anion gap: 11 (ref 5–15)
BUN: 12 mg/dL (ref 8–23)
CO2: 22 mmol/L (ref 22–32)
Calcium: 9 mg/dL (ref 8.9–10.3)
Chloride: 94 mmol/L — ABNORMAL LOW (ref 98–111)
Creatinine, Ser: 0.65 mg/dL (ref 0.44–1.00)
GFR, Estimated: >60 mL/min (ref >60)
Glucose, Bld: 128 mg/dL — ABNORMAL HIGH (ref 70–99)
Potassium: 3.2 mmol/L — ABNORMAL LOW (ref 3.5–5.1)
Sodium: 127 mmol/L — ABNORMAL LOW (ref 135–145)

## 2023-03-18 LAB — PROTIME-INR
INR: 1.1 (ref 0.8–1.2)
Prothrombin Time: 13.9 s (ref 11.4–15.2)

## 2023-03-18 LAB — MAGNESIUM: Magnesium: 1.8 mg/dL (ref 1.7–2.4)

## 2023-03-18 MED ORDER — HYDROCODONE-ACETAMINOPHEN 5-325 MG PO TABS
1.0000 | ORAL_TABLET | Freq: Once | ORAL | Status: AC
  Administered 2023-03-18: 1 via ORAL
  Filled 2023-03-18: qty 1

## 2023-03-18 MED ORDER — SENNOSIDES-DOCUSATE SODIUM 8.6-50 MG PO TABS: 1.0000 | ORAL_TABLET | Freq: Every evening | ORAL | Status: DC | PRN

## 2023-03-18 MED ORDER — METHOCARBAMOL 500 MG PO TABS
500.0000 mg | ORAL_TABLET | Freq: Four times a day (QID) | ORAL | Status: DC | PRN
  Administered 2023-03-19 – 2023-03-21 (×4): 500 mg via ORAL
  Filled 2023-03-18 (×5): qty 1

## 2023-03-18 MED ORDER — AMLODIPINE BESYLATE 10 MG PO TABS
10.0000 mg | ORAL_TABLET | Freq: Every day | ORAL | Status: DC
  Filled 2023-03-18: qty 1

## 2023-03-18 MED ORDER — METHOCARBAMOL 1000 MG/10ML IJ SOLN: 500.0000 mg | Freq: Four times a day (QID) | INTRAMUSCULAR | Status: DC | PRN

## 2023-03-18 MED ORDER — DIPHENHYDRAMINE HCL 25 MG PO CAPS
25.0000 mg | ORAL_CAPSULE | Freq: Once | ORAL | Status: AC
  Administered 2023-03-18: 25 mg via ORAL
  Filled 2023-03-18: qty 1

## 2023-03-18 MED ORDER — MORPHINE SULFATE (PF) 2 MG/ML IV SOLN
0.5000 mg | INTRAVENOUS | Status: DC | PRN
  Administered 2023-03-19: 0.5 mg via INTRAVENOUS
  Filled 2023-03-18: qty 1

## 2023-03-18 MED ORDER — HYDROCODONE-ACETAMINOPHEN 5-325 MG PO TABS
1.0000 | ORAL_TABLET | Freq: Four times a day (QID) | ORAL | Status: DC | PRN
  Administered 2023-03-18: 1 via ORAL
  Filled 2023-03-18: qty 1

## 2023-03-18 MED ORDER — MORPHINE SULFATE (PF) 2 MG/ML IV SOLN
2.0000 mg | INTRAVENOUS | Status: DC | PRN
  Administered 2023-03-18: 2 mg via INTRAVENOUS
  Filled 2023-03-18: qty 1

## 2023-03-18 MED ORDER — SERTRALINE HCL 50 MG PO TABS
50.0000 mg | ORAL_TABLET | Freq: Every day | ORAL | Status: DC
Start: 2023-03-19 — End: 2023-03-22
  Administered 2023-03-19 – 2023-03-22 (×4): 50 mg via ORAL
  Filled 2023-03-18 (×4): qty 1

## 2023-03-18 MED ORDER — POTASSIUM CHLORIDE 20 MEQ PO PACK
40.0000 meq | PACK | Freq: Once | ORAL | Status: AC
  Administered 2023-03-18: 40 meq via ORAL
  Filled 2023-03-18: qty 2

## 2023-03-18 MED ORDER — LEVETIRACETAM 500 MG PO TABS
500.0000 mg | ORAL_TABLET | Freq: Two times a day (BID) | ORAL | Status: DC
  Administered 2023-03-18 – 2023-03-22 (×8): 500 mg via ORAL
  Filled 2023-03-18 (×8): qty 1

## 2023-03-18 MED ORDER — MIRABEGRON ER 25 MG PO TB24
25.0000 mg | ORAL_TABLET | Freq: Every day | ORAL | Status: DC
  Administered 2023-03-20 – 2023-03-22 (×3): 25 mg via ORAL
  Filled 2023-03-18 (×4): qty 1

## 2023-03-18 NOTE — ED Triage Notes (Signed)
Unwitnessed fall in the common area. Orientation to self severe dementia. Fall went from carpet to wood unknown if LOC or how the fall actually happened pt does not remember and unwitnessed. Splinted by ems they are unsure if deformity present they confirmed a knot and c/o of left leg pain unable to lift but I am able to lift the leg slightly without pt c/o pain compared to the left  hand

## 2023-03-18 NOTE — Assessment & Plan Note (Addendum)
Sodium mildly low at 127, but appears to be her baseline from at least 2022 looking at records (127-128) Holding chlorthalidone Urine studies/TSH/osmolality  Trend

## 2023-03-18 NOTE — Assessment & Plan Note (Addendum)
Fall and delirium precautions Will need sitter: order placed  Continue zoloft

## 2023-03-18 NOTE — Assessment & Plan Note (Addendum)
Continue norvasc  Hold chlorthalidone in setting of hyponatremia

## 2023-03-18 NOTE — H&P (Signed)
History and Physical    Patient: Alicia [name] WGN:[id number] DOB: [birth year] DOA: 03/18/2023 DOS: the patient was seen and examined on 03/18/2023 PCP: [name], MD  Patient coming from: SNF - [facility]. Memory care. Ambulates independently    Chief Complaint: fall   HPI: Alicia [name] is a 89 y.o. female with medical history significant of GERD/PUD, hx of seizures, RLS, HTN, HLD, Dementia, depression and anxiety  who presented to ED after an unwitnessed fall in common area of her facility. She is on no blood thinners. Granddaughter is present and is unsure how she fell. She has not had a seizure in over 10-20 years and takes her seizure mediation. She fell and they heard her cry out. They think she was walking on the carpet to the hardwood and tripped and likely fell onto her left hip. She couldn't get up and complained of her left leg hurting. Her left hand started to swell immediately. No tongue biting or urinary incontinence. Unknown if she lost consciousness or hit or head, but daughter states she doubts it as the area is very small and staff went immediately.    Family denies any recent illness/fever. She is right handed.    No smoking or drinking   ER Course:  vitals: afebrile, bp: 149/132, HR: 92, RR: 20, oxygen: 99%RA Pertinent labs: wbc: 14.3, sodium: 127, potassium: 3.2,  Left hip: subcapital left femoral neck fracture, mildly impacted.  Left wrist xray: acute transverse fracture through the distal radius, mildly impacted.  Left knee xray: no acute fracture. Mild lateral compartment degenerative changes.  In ED: ortho consulted given pain medication and potassium. TRH asked to admit.    Review of Systems: unable to review all systems due to the inability of the patient to answer questions. Past Medical History:  Diagnosis Date   Eye exam abnormal 11/21/14   Dr.Hecker-glaucoma suspect, cataracts, hemorrage   GERD (gastroesophageal reflux disease)     Hx of migraines    Hyperlipidemia    Macular degeneration    Osteoporosis    DEXA 03/2010 T-3.2 R hip; declines meds   PUD (peptic ulcer disease) 1980   Restless leg syndrome    Seizure disorder (HCC) 1997   evaluated by [facility] in past   Tinnitus 05/2009   DR BATES--related to change in med/generic.  Resolved   Vitamin D deficiency    Past Surgical History:  Procedure Laterality Date   CATARACT EXTRACTION Bilateral 11/2014, 01/2015   Dr. Elmer Picker   TONSILLECTOMY     TUBAL LIGATION     Social History:  reports that she has never smoked. She has never used smokeless tobacco. She reports that she does not drink alcohol and does not use drugs.  Allergies  Allergen Reactions   Sulfa Antibiotics Rash    Family History  Problem Relation Age of Onset   Heart disease Mother    Dementia Father    Alzheimer's disease Sister    Thyroid disease Sister        removed, thinks benign   Heart disease Brother        35's   Seizures Daughter 63   Diabetes Neg Hx    Cancer Neg Hx     Prior to Admission medications   Medication Sig Start Date End Date Taking? Authorizing Provider  acetaminophen (TYLENOL) 325 MG tablet Take 325 mg by mouth every 6 (six) hours as needed. Reported on 09/09/2015    [provider]  amLODipine (NORVASC)  10 MG tablet Take 1 tablet (10 mg total) by mouth daily. 04/29/21   Lewayne Bunting, MD  chlorthalidone (HYGROTON) 25 MG tablet Take 0.5 tablets (12.5 mg total) by mouth daily. 05/28/21 03/30/22  Lewayne Bunting, MD  cholecalciferol (VITAMIN D) 1000 UNITS tablet Take 1,000 Units by mouth daily.    [provider]  levETIRAcetam (KEPPRA) 500 MG tablet TAKE 1 TABLET BY MOUTH Yetta Barre *NDC [id number]* 11/18/21   [name], MD    Physical Exam: Vitals:   03/18/23 1658 03/18/23 1700 03/18/23 2015 03/18/23 2110  BP:  (!) 141/78 (!) 136/97   Pulse:  86 93   Resp:  20 20   Temp: 98.2 F (36.8 C)   98.1 F (36.7 C)  TempSrc: Oral   Oral  SpO2:   99% 98%   Weight:      Height:       General:  Appears calm and comfortable and is in NAD. Pleasantly confused.  Eyes:  PERRL, EOMI, normal lids, iris ENT:  grossly normal hearing, lips & tongue, mmm; appropriate dentition Neck:  no LAD, masses or thyromegaly; no carotid bruits Cardiovascular:  RRR, no m/r/g. No LE edema.  Respiratory:   CTA bilaterally with no wheezes/rales/rhonchi.  Normal respiratory effort. Abdomen:  soft, NT, ND, NABS Back:   normal alignment, no CVAT Skin:  no rash or induration seen on limited exam Musculoskeletal:  grossly normal tone BUE. Moving her toes bilaterally. LLE: TTP over greater trochanteric process. No edema/erythema. No edema of knee or ankle. Pedal pulses intact. Left wrist in splint and wrapped.  Lower extremity:  No LE edema.  Limited foot exam with no ulcerations.  2+ distal pulses. Psychiatric:  pleasantly confused at baseline. Normal mood and affect.  Neurologic:  CN 2-12 grossly intact, moves all extremities in coordinated fashion, sensation intact   Radiological Exams on Admission: Independently reviewed - see discussion in A/P where applicable  DG Hip Unilat W or Wo Pelvis 2-3 Views Left  Result Date: 03/18/2023 CLINICAL DATA:  Fall EXAM: DG HIP (WITH OR WITHOUT PELVIS) 2-3V LEFT COMPARISON:  None. FINDINGS: The bones are osteopenic. There is subcapital left femoral neck fracture, mildly impacted. There is no dislocation. Joint spaces are maintained. Soft tissues are within normal limits. IMPRESSION: Subcapital left femoral neck fracture, mildly impacted. Electronically Signed   By: [name] M.D.   On: 03/18/2023 18:19   DG Knee Complete 4 Views Left  Result Date: 03/18/2023 CLINICAL DATA:  Fall EXAM: LEFT KNEE - COMPLETE 4+ VIEW COMPARISON:  None Available. FINDINGS: The bones are osteopenic. There is no acute fracture, dislocation or joint effusion. There is mild lateral compartment degenerative change with chondral calcification.  IMPRESSION: 1. No acute fracture or dislocation. 2. Mild lateral compartment degenerative change. Electronically Signed   By: [name] M.D.   On: 03/18/2023 18:17   DG Wrist Complete Left  Result Date: 03/18/2023 CLINICAL DATA:  Fall EXAM: LEFT WRIST - COMPLETE 3+ VIEW COMPARISON:  None Available. FINDINGS: The bones are diffusely osteopenic. There is an acute transverse fracture through the distal radius, mildly impacted. There is no dislocation. There is soft tissue swelling surrounding the wrist. There is radiocarpal joint space narrowing and calcification of the triangular fibrocartilage compatible with degenerative change. IMPRESSION: Acute transverse fracture through the distal radius, mildly impacted. Electronically Signed   By: [name] M.D.   On: 03/18/2023 18:16    EKG: Independently reviewed.  NSR with rate 91; nonspecific  ST changes with no evidence of acute ischemia with multiple PVCs (PVCs new)    Labs on Admission: I have personally reviewed the available labs and imaging studies at the time of the admission.  Pertinent labs:   wbc: 14.3,  sodium: 127,  potassium: 3.2  Assessment and Plan: Principal Problem:   Fracture of femoral neck, left (HCC) Active Problems:   Left wrist fracture   Hypokalemia   Hyponatremia   Leukocytosis   Seizure disorder (HCC)   Essential hypertension   Dementia without behavioral disturbance (HCC)   Anxiety and depression   Vitamin D deficiency    Assessment and Plan: * Fracture of femoral neck, left (HCC) 89 year old presenting from  after an unwitnessed falls found to have a left subcapital femoral neck fracture that is mildly impacted as well as a left transverse fracture through the left distal radius, mildly impacted.  -admit to tele at [facility]  -ortho consulted with plan for surgery tomorrow (Dr. Steward Drone) -NPO except for meds at midnight  -SCDs -pain medication with tylenol, norco and morphine for severe pain -hip fracture  protocol  -check vitamin D, INR, type and screen and UA  -unwitnessed, but no hx of seizure in 10-20 years and no seizure like activity witnessed  -no change in mental status, no trauma to head, not on any blood thinners.  -fall precautions    Left wrist fracture Plans to reduce and cast while in OR for her hip surgery with Dr. Steward Drone tomorrow  Continue splint Pain management   Hypokalemia Check magnesium Repleted in ED ? If secondary to chlorthalidone. Holding  Trend    Hyponatremia Sodium mildly low at 127, but appears to be her baseline from at least 2022 looking at records (127-128) Holding chlorthalidone Urine studies/TSH/osmolality  Trend   Leukocytosis Likely reactive UA pending, but no other source of infection  Trend   Seizure disorder (HCC) Continue keppra Unwitnessed fall, but immediately staff saw her with no seizure like activity  Seizure precautions   Essential hypertension Continue norvasc  Hold chlorthalidone in setting of hyponatremia   Dementia without behavioral disturbance (HCC) Fall and delirium precautions Will need sitter: order placed  Continue zoloft   Anxiety and depression Continue zoloft   Vitamin D deficiency Check vitamin D in setting of 2 fractures  Continue daily vitamin D      Advance Care Planning:   Code Status: Full Code   Consults: ortho: Dr. Steward Drone   DVT Prophylaxis: SCDs   Family Communication: graddaughter at bedside and updated daughter by phone.   Severity of Illness: The appropriate patient status for this patient is INPATIENT. Inpatient status is judged to be reasonable and necessary in order to provide the required intensity of service to ensure the patient's safety. The patient's presenting symptoms, physical exam findings, and initial radiographic and laboratory data in the context of their chronic comorbidities is felt to place them at high risk for further clinical deterioration. Furthermore, it is not  anticipated that the patient will be medically stable for discharge from the hospital within 2 midnights of admission.   * I certify that at the point of admission it is my clinical judgment that the patient will require inpatient hospital care spanning beyond 2 midnights from the point of admission due to high intensity of service, high risk for further deterioration and high frequency of surveillance required.*  Author: Orland Mustard, MD 03/18/2023 10:13 PM  For on call review [email or web address].

## 2023-03-18 NOTE — Assessment & Plan Note (Addendum)
Check magnesium Repleted in ED ? If secondary to chlorthalidone. Holding  Trend

## 2023-03-18 NOTE — ED Provider Notes (Signed)
[place] EMERGENCY DEPARTMENT AT [facility] Provider Note   CSN: [id number] Arrival date & time: 03/18/23  1636     History  Chief Complaint  Patient presents with   Gilman Schmidt is a 89 y.o. female.  HPI 89 year old female with a history of pretty significant dementia presents with a fall.  It was unwitnessed at her facility but the daughter suspects she tripped.  Staff heard her cry out immediately after a fall and do not suspect she passed out.  He was no signs of a head injury and patient denies headache.  She is mostly complaining of pain in her left wrist though there was some pain in her left leg and they had a hard time standing her up at the facility.  Patient has otherwise previously been well according to the daughter who is at the bedside and seems to be at her mental baseline.  Has had no recent illness symptoms.  Has had recent checkup by her PCP and seems to be doing well medically.  Home Medications Prior to Admission medications   Medication Sig Start Date End Date Taking? Authorizing Provider  amLODipine (NORVASC) 10 MG tablet Take 1 tablet (10 mg total) by mouth daily. 04/29/21  Yes Lewayne Bunting, MD  chlorthalidone (HYGROTON) 25 MG tablet Take 25 mg by mouth daily.   Yes [provider]  levETIRAcetam (KEPPRA) 500 MG tablet TAKE 1 TABLET BY MOUTH Yetta Barre *NDC [id number]* 11/18/21  Yes [name], MD  mirabegron ER (MYRBETRIQ) 25 MG TB24 tablet Take 25 mg by mouth daily.   Yes [provider]  sertraline (ZOLOFT) 50 MG tablet Take 50 mg by mouth daily.   Yes [provider]  Vitamin D, Cholecalciferol, 25 MCG (1000 UT) CAPS Take 1 capsule by mouth daily.   Yes [provider]  acetaminophen (TYLENOL) 325 MG tablet Take 325 mg by mouth every 6 (six) hours as needed. Reported on 09/09/2015 Patient not taking: Reported on 03/18/2023    [provider]  chlorthalidone (HYGROTON) 25 MG tablet  Take 0.5 tablets (12.5 mg total) by mouth daily. 05/28/21 03/30/22  Lewayne Bunting, MD      Allergies    Sulfa antibiotics    Review of Systems   Review of Systems  Unable to perform ROS: Dementia    Physical Exam Updated Vital Signs BP (!) 136/97   Pulse 93   Temp 98.1 F (36.7 C) (Oral)   Resp 20   Ht 5\' 1"  (1.549 m)   Wt 49 kg   SpO2 98%   BMI 20.41 kg/m  Physical Exam Vitals and nursing note reviewed.  Constitutional:      Appearance: She is well-developed.  HENT:     Head: Normocephalic and atraumatic.  Cardiovascular:     Rate and Rhythm: Normal rate and regular rhythm.     Pulses:          Radial pulses are 2+ on the left side.     Heart sounds: Normal heart sounds.  Pulmonary:     Effort: Pulmonary effort is normal.  Abdominal:     General: There is no distension.  Musculoskeletal:     Left shoulder: No tenderness.     Left elbow: No tenderness.     Left forearm: No tenderness.     Left wrist: Swelling and tenderness present. No deformity. Decreased range of motion.     Left hand: No tenderness. Normal  range of motion.     Left hip: No tenderness. Decreased range of motion (induces pain).     Left upper leg: No deformity or tenderness.     Left knee: Decreased range of motion (induces pain). No tenderness.     Left lower leg: No tenderness.  Skin:    General: Skin is warm and dry.  Neurological:     Mental Status: She is alert.     ED Results / Procedures / Treatments   Labs (all labs ordered are listed, but only abnormal results are displayed) Labs Reviewed  BASIC METABOLIC PANEL - Abnormal; Notable for the following components:      Result Value   Sodium 127 (*)    Potassium 3.2 (*)    Chloride 94 (*)    Glucose, Bld 128 (*)    All other components within normal limits  CBC WITH DIFFERENTIAL/PLATELET - Abnormal; Notable for the following components:   WBC 14.3 (*)    Neutro Abs 12.0 (*)    Abs Immature Granulocytes 0.10 (*)    All other  components within normal limits  PROTIME-INR  MAGNESIUM  TSH  VITAMIN D 25 HYDROXY (VIT D DEFICIENCY, FRACTURES)  SODIUM, URINE, RANDOM  OSMOLALITY, URINE  OSMOLALITY  URINALYSIS, ROUTINE W REFLEX MICROSCOPIC  CBC  BASIC METABOLIC PANEL  TYPE AND SCREEN  TROPONIN I (HIGH SENSITIVITY)    EKG None  Radiology DG Hip Unilat W or Wo Pelvis 2-3 Views Left  Result Date: 03/18/2023 CLINICAL DATA:  Fall EXAM: DG HIP (WITH OR WITHOUT PELVIS) 2-3V LEFT COMPARISON:  None. FINDINGS: The bones are osteopenic. There is subcapital left femoral neck fracture, mildly impacted. There is no dislocation. Joint spaces are maintained. Soft tissues are within normal limits. IMPRESSION: Subcapital left femoral neck fracture, mildly impacted. Electronically Signed   By: [name] M.D.   On: 03/18/2023 18:19   DG Knee Complete 4 Views Left  Result Date: 03/18/2023 CLINICAL DATA:  Fall EXAM: LEFT KNEE - COMPLETE 4+ VIEW COMPARISON:  None Available. FINDINGS: The bones are osteopenic. There is no acute fracture, dislocation or joint effusion. There is mild lateral compartment degenerative change with chondral calcification. IMPRESSION: 1. No acute fracture or dislocation. 2. Mild lateral compartment degenerative change. Electronically Signed   By: [name] M.D.   On: 03/18/2023 18:17   DG Wrist Complete Left  Result Date: 03/18/2023 CLINICAL DATA:  Fall EXAM: LEFT WRIST - COMPLETE 3+ VIEW COMPARISON:  None Available. FINDINGS: The bones are diffusely osteopenic. There is an acute transverse fracture through the distal radius, mildly impacted. There is no dislocation. There is soft tissue swelling surrounding the wrist. There is radiocarpal joint space narrowing and calcification of the triangular fibrocartilage compatible with degenerative change. IMPRESSION: Acute transverse fracture through the distal radius, mildly impacted. Electronically Signed   By: [name] M.D.   On: 03/18/2023 18:16     Procedures Procedures    Medications Ordered in ED Medications  HYDROcodone-acetaminophen (NORCO/VICODIN) 5-325 MG per tablet 1-2 tablet (has no administration in time range)  methocarbamol (ROBAXIN) tablet 500 mg (has no administration in time range)    Or  methocarbamol (ROBAXIN) injection 500 mg (has no administration in time range)  senna-docusate (Senokot-S) tablet 1 tablet (has no administration in time range)  morphine (PF) 2 MG/ML injection 0.5 mg (has no administration in time range)  amLODipine (NORVASC) tablet 10 mg (has no administration in time range)  sertraline (ZOLOFT) tablet 50 mg (has no administration  in time range)  mirabegron ER (MYRBETRIQ) tablet 25 mg (has no administration in time range)  levETIRAcetam (KEPPRA) tablet 500 mg (has no administration in time range)  diphenhydrAMINE (BENADRYL) capsule 25 mg (has no administration in time range)  HYDROcodone-acetaminophen (NORCO/VICODIN) 5-325 MG per tablet 1 tablet (1 tablet Oral Given 03/18/23 1716)  potassium chloride (KLOR-CON) packet 40 mEq (40 mEq Oral Given 03/18/23 2110)    ED Course/ Medical Decision Making/ A&P                                 Medical Decision Making Amount and/or Complexity of Data Reviewed Labs: ordered. Radiology: ordered.    Details: Distal radius fracture.  Left hip fracture. ECG/medicine tests: ordered and independent interpretation performed.    Details: Sinus rhythm  Risk Prescription drug management. Decision regarding hospitalization.   Patient presents after an unwitnessed fall.  Does not Solik a seizure as she was awake and at baseline as soon as staff arrived, which seem to be right after she fell.  She is found to have a distal radius fracture and a left hip fracture.  Discussed with Dr. Steward Drone, he will manage both and will take her to the OR tomorrow and prefers [facility] for admission.  She will be admitted by the hospitalist service, discussed with Dr.  Artis Flock.        Final Clinical Impression(s) / ED Diagnoses Final diagnoses:  Closed fracture of left hip, initial encounter (HCC)  Closed fracture of distal end of left radius, unspecified fracture morphology, initial encounter    Rx / DC Orders ED Discharge Orders     None         Pricilla Loveless, MD 03/18/23 2309

## 2023-03-18 NOTE — Assessment & Plan Note (Signed)
Continue keppra Unwitnessed fall, but immediately staff saw her with no seizure like activity  Seizure precautions

## 2023-03-18 NOTE — Assessment & Plan Note (Signed)
Likely reactive UA pending, but no other source of infection  Trend

## 2023-03-18 NOTE — Assessment & Plan Note (Addendum)
89 year old presenting from  after an unwitnessed falls found to have a left subcapital femoral neck fracture that is mildly impacted as well as a left transverse fracture through the left distal radius, mildly impacted.  -admit to tele at [facility]  -ortho consulted with plan for surgery tomorrow (Dr. Steward Drone) -NPO except for meds at midnight  -SCDs -pain medication with tylenol, norco and morphine for severe pain -hip fracture protocol  -check vitamin D, INR, type and screen and UA  -unwitnessed, but no hx of seizure in 10-20 years and no seizure like activity witnessed  -no change in mental status, no trauma to head, not on any blood thinners.  -fall precautions

## 2023-03-18 NOTE — Assessment & Plan Note (Addendum)
Check vitamin D in setting of 2 fractures  Continue daily vitamin D

## 2023-03-18 NOTE — ED Notes (Signed)
[facility] called to set up transport

## 2023-03-18 NOTE — Assessment & Plan Note (Signed)
Plans to reduce and cast while in OR for her hip surgery with Dr. Steward Drone tomorrow  Continue splint Pain management

## 2023-03-18 NOTE — ED Notes (Signed)
ED TO INPATIENT HANDOFF REPORT  ED Nurse Name and Phone #: Rebecca Eaton RN  S Name/Age/Gender Alicia [name] 89 y.o. female Room/Bed: WA05/WA05  Code Status   Code Status: Full Code  Home/SNF/Other Home Patient oriented to: self Is this baseline? Yes   Triage Complete: Triage complete  Chief Complaint Fracture of femoral neck, left (HCC) [S72.002A]  Triage Note Unwitnessed fall in the common area. Orientation to self severe dementia. Fall went from carpet to wood unknown if LOC or how the fall actually happened pt does not remember and unwitnessed. Splinted by ems they are unsure if deformity present they confirmed a knot and c/o of left leg pain unable to lift but I am able to lift the leg slightly without pt c/o pain compared to the left  hand   Allergies Allergies  Allergen Reactions   Sulfa Antibiotics Rash    Level of Care/Admitting Diagnosis ED Disposition     ED Disposition  Admit   Condition  --   Comment  Hospital Area: [facility] [[id number]]  Level of Care: Telemetry Medical [104]  May admit patient to [facility] or [facility] if equivalent level of care is available:: No  Covid Evaluation: Asymptomatic - no recent exposure (last 10 days) testing not required  Diagnosis: Fracture of femoral neck, left [facility]) [[id number]]  Admitting Physician: Orland Mustard [[id number]]  Attending Physician: Orland Mustard [phone]  Certification:: I certify this patient will need inpatient services for at least 2 midnights          B Medical/Surgery History Past Medical History:  Diagnosis Date   Eye exam abnormal 11/21/14   Dr.Hecker-glaucoma suspect, cataracts, hemorrage   GERD (gastroesophageal reflux disease)    Hx of migraines    Hyperlipidemia    Macular degeneration    Osteoporosis    DEXA 03/2010 T-3.2 R hip; declines meds   PUD (peptic ulcer disease) 1980   Restless leg syndrome    Seizure disorder (HCC) 1997   evaluated by [facility] in  past   Tinnitus 05/2009   DR BATES--related to change in med/generic.  Resolved   Vitamin D deficiency    Past Surgical History:  Procedure Laterality Date   CATARACT EXTRACTION Bilateral 11/2014, 01/2015   Dr. Elmer Picker   TONSILLECTOMY     TUBAL LIGATION       A IV Location/Drains/Wounds Patient Lines/Drains/Airways Status     Active Line/Drains/Airways     Name Placement date Placement time Site Days   Peripheral IV 03/18/23 20 G Anterior;Right Forearm 03/18/23  1930  Forearm  less than 1            Intake/Output Last 24 hours No intake or output data in the 24 hours ending 03/18/23 2225  Labs/Imaging Results for orders placed or performed during the hospital encounter of 03/18/23 (from the past 48 hour(s))  Basic metabolic panel     Status: Abnormal   Collection Time: 03/18/23  7:31 PM  Result Value Ref Range   Sodium 127 (L) 135 - 145 mmol/L   Potassium 3.2 (L) 3.5 - 5.1 mmol/L   Chloride 94 (L) 98 - 111 mmol/L   CO2 22 22 - 32 mmol/L   Glucose, Bld 128 (H) 70 - 99 mg/dL    Comment: Glucose reference range applies only to samples taken after fasting for at least 8 hours.   BUN 12 8 - 23 mg/dL   Creatinine, Ser 3.29 0.44 - 1.00 mg/dL   Calcium 9.0  8.9 - 10.3 mg/dL   GFR, Estimated >13 >24 mL/min    Comment: (NOTE) Calculated using the CKD-EPI Creatinine Equation (2021)    Anion gap 11 5 - 15    Comment: Performed at [facility] [address], [place], [place] [address]  CBC with Differential     Status: Abnormal   Collection Time: 03/18/23  7:31 PM  Result Value Ref Range   WBC 14.3 (H) 4.0 - 10.5 K/uL   RBC 4.60 3.87 - 5.11 MIL/uL   Hemoglobin 13.3 12.0 - 15.0 g/dL   HCT 72.5 36.6 - 44.0 %   MCV 85.9 80.0 - 100.0 fL   MCH 28.9 26.0 - 34.0 pg   MCHC 33.7 30.0 - 36.0 g/dL   RDW 34.7 42.5 - 95.6 %   Platelets 237 150 - 400 K/uL   nRBC 0.0 0.0 - 0.2 %   Neutrophils Relative % 84 %   Neutro Abs 12.0 (H) 1.7 - 7.7 K/uL   Lymphocytes  Relative 10 %   Lymphs Abs 1.4 0.7 - 4.0 K/uL   Monocytes Relative 5 %   Monocytes Absolute 0.7 0.1 - 1.0 K/uL   Eosinophils Relative 0 %   Eosinophils Absolute 0.0 0.0 - 0.5 K/uL   Basophils Relative 0 %   Basophils Absolute 0.0 0.0 - 0.1 K/uL   Immature Granulocytes 1 %   Abs Immature Granulocytes 0.10 (H) 0.00 - 0.07 K/uL    Comment: Performed at [facility] [address], [place], [place] [address]  Protime-INR     Status: None   Collection Time: 03/18/23  7:31 PM  Result Value Ref Range   Prothrombin Time 13.9 11.4 - 15.2 seconds   INR 1.1 0.8 - 1.2    Comment: (NOTE) INR goal varies based on device and disease states. Performed at [facility], [address], [place], [place] [address]    DG Hip Lucienne Capers or Wo Pelvis 2-3 Views Left  Result Date: 03/18/2023 CLINICAL DATA:  Fall EXAM: DG HIP (WITH OR WITHOUT PELVIS) 2-3V LEFT COMPARISON:  None. FINDINGS: The bones are osteopenic. There is subcapital left femoral neck fracture, mildly impacted. There is no dislocation. Joint spaces are maintained. Soft tissues are within normal limits. IMPRESSION: Subcapital left femoral neck fracture, mildly impacted. Electronically Signed   By: [name] M.D.   On: 03/18/2023 18:19   DG Knee Complete 4 Views Left  Result Date: 03/18/2023 CLINICAL DATA:  Fall EXAM: LEFT KNEE - COMPLETE 4+ VIEW COMPARISON:  None Available. FINDINGS: The bones are osteopenic. There is no acute fracture, dislocation or joint effusion. There is mild lateral compartment degenerative change with chondral calcification. IMPRESSION: 1. No acute fracture or dislocation. 2. Mild lateral compartment degenerative change. Electronically Signed   By: [name] M.D.   On: 03/18/2023 18:17   DG Wrist Complete Left  Result Date: 03/18/2023 CLINICAL DATA:  Fall EXAM: LEFT WRIST - COMPLETE 3+ VIEW COMPARISON:  None Available. FINDINGS: The bones are diffusely osteopenic. There is  an acute transverse fracture through the distal radius, mildly impacted. There is no dislocation. There is soft tissue swelling surrounding the wrist. There is radiocarpal joint space narrowing and calcification of the triangular fibrocartilage compatible with degenerative change. IMPRESSION: Acute transverse fracture through the distal radius, mildly impacted. Electronically Signed   By: [name] M.D.   On: 03/18/2023 18:16    Pending Labs Unresulted Labs (From admission, onward)  Start     Ordered   03/19/23 0500  CBC  Tomorrow morning,   R        03/18/23 2146   03/19/23 0500  Basic metabolic panel  Tomorrow morning,   R        03/18/23 2146   03/18/23 2123  Urinalysis, Routine w reflex microscopic -Urine, Clean Catch  Once,   R       Question:  Specimen Source  Answer:  Urine, Clean Catch   03/18/23 2122   03/18/23 2121  Sodium, urine, random  Once,   R        03/18/23 2120   03/18/23 2121  Osmolality, urine  Once,   R        03/18/23 2120   03/18/23 2121  Osmolality  Once,   R        03/18/23 2120   03/18/23 2117  VITAMIN D 25 Hydroxy (Vit-D Deficiency, Fractures)  Once,   R        03/18/23 2116   03/18/23 2109  Magnesium  Once,   STAT        03/18/23 2108   03/18/23 2109  TSH  Once,   URGENT        03/18/23 2108   03/18/23 1845  Type and screen [facility]  Once,   STAT       Comments: [facility]    03/18/23 1844            Vitals/Pain Today's Vitals   03/18/23 1658 03/18/23 1700 03/18/23 2015 03/18/23 2110  BP:  (!) 141/78 (!) 136/97   Pulse:  86 93   Resp:  20 20   Temp: 98.2 F (36.8 C)   98.1 F (36.7 C)  TempSrc: Oral   Oral  SpO2:  99% 98%   Weight:      Height:        Isolation Precautions No active isolations  Medications Medications  HYDROcodone-acetaminophen (NORCO/VICODIN) 5-325 MG per tablet 1-2 tablet (has no administration in time range)  methocarbamol (ROBAXIN) tablet 500 mg (has no  administration in time range)    Or  methocarbamol (ROBAXIN) injection 500 mg (has no administration in time range)  senna-docusate (Senokot-S) tablet 1 tablet (has no administration in time range)  morphine (PF) 2 MG/ML injection 0.5 mg (has no administration in time range)  amLODipine (NORVASC) tablet 10 mg (has no administration in time range)  sertraline (ZOLOFT) tablet 50 mg (has no administration in time range)  mirabegron ER (MYRBETRIQ) tablet 25 mg (has no administration in time range)  HYDROcodone-acetaminophen (NORCO/VICODIN) 5-325 MG per tablet 1 tablet (1 tablet Oral Given 03/18/23 1716)  potassium chloride (KLOR-CON) packet 40 mEq (40 mEq Oral Given 03/18/23 2110)    Mobility Non-ambulatory d/t femur fracture     Focused Assessments     R Recommendations: See Admitting Provider Note  Report given to:   Additional Notes:  Pt has hx of dementia, is only alert to self, family is involved in care and at bedside.

## 2023-03-18 NOTE — Progress Notes (Signed)
Orthopedic Tech Progress Note Patient Details:  Alicia [name] Feb 06, 1934 [id number]  Ortho Devices Type of Ortho Device: Sugartong splint Ortho Device/Splint Location: LUE Ortho Device/Splint Interventions: Ordered, Application   Post Interventions Patient Tolerated: Alicia [name] 03/18/2023, 7:21 PM

## 2023-03-18 NOTE — Assessment & Plan Note (Signed)
Continue zoloft

## 2023-03-19 ENCOUNTER — Other Ambulatory Visit: Payer: Self-pay

## 2023-03-19 ENCOUNTER — Inpatient Hospital Stay (HOSPITAL_COMMUNITY): Payer: Medicare Other | Admitting: Anesthesiology

## 2023-03-19 ENCOUNTER — Inpatient Hospital Stay (HOSPITAL_COMMUNITY): Payer: Medicare Other

## 2023-03-19 ENCOUNTER — Encounter (HOSPITAL_COMMUNITY)
Admission: EM | Disposition: A | Discharge status: Skilled Nursing Facility | Payer: Self-pay | Source: Skilled Nursing Facility | Attending: Internal Medicine

## 2023-03-19 ENCOUNTER — Encounter (HOSPITAL_COMMUNITY): Payer: Self-pay | Admitting: Family Medicine

## 2023-03-19 DIAGNOSIS — S52502A Unspecified fracture of the lower end of left radius, initial encounter for closed fracture: Secondary | ICD-10-CM | POA: Diagnosis not present

## 2023-03-19 DIAGNOSIS — S72002D Fracture of unspecified part of neck of left femur, subsequent encounter for closed fracture with routine healing: Secondary | ICD-10-CM | POA: Diagnosis not present

## 2023-03-19 DIAGNOSIS — S72002A Fracture of unspecified part of neck of left femur, initial encounter for closed fracture: Secondary | ICD-10-CM | POA: Diagnosis not present

## 2023-03-19 HISTORY — PX: HIP PINNING,CANNULATED: SHX1758

## 2023-03-19 HISTORY — PX: CLOSED REDUCTION WRIST FRACTURE: SHX1091

## 2023-03-19 LAB — OSMOLALITY, URINE: Osmolality, Ur: 521 mosm/kg (ref 300–900)

## 2023-03-19 LAB — URINALYSIS, ROUTINE W REFLEX MICROSCOPIC
Bilirubin Urine: NEGATIVE
Glucose, UA: NEGATIVE mg/dL
Ketones, ur: NEGATIVE mg/dL
Nitrite: POSITIVE — AB
Protein, ur: NEGATIVE mg/dL
Specific Gravity, Urine: 1.015 (ref 1.005–1.030)
pH: 7 (ref 5.0–8.0)

## 2023-03-19 LAB — BASIC METABOLIC PANEL
Anion gap: 9 (ref 5–15)
BUN: 6 mg/dL — ABNORMAL LOW (ref 8–23)
CO2: 23 mmol/L (ref 22–32)
Calcium: 8.7 mg/dL — ABNORMAL LOW (ref 8.9–10.3)
Chloride: 95 mmol/L — ABNORMAL LOW (ref 98–111)
Creatinine, Ser: 0.55 mg/dL (ref 0.44–1.00)
GFR, Estimated: >60 mL/min (ref >60)
Glucose, Bld: 140 mg/dL — ABNORMAL HIGH (ref 70–99)
Potassium: 3.7 mmol/L (ref 3.5–5.1)
Sodium: 127 mmol/L — ABNORMAL LOW (ref 135–145)

## 2023-03-19 LAB — CBC
HCT: 36.6 % (ref 36.0–46.0)
Hemoglobin: 12.4 g/dL (ref 12.0–15.0)
MCH: 28.6 pg (ref 26.0–34.0)
MCHC: 33.9 g/dL (ref 30.0–36.0)
MCV: 84.3 fL (ref 80.0–100.0)
Platelets: 216 10*3/uL (ref 150–400)
RBC: 4.34 MIL/uL (ref 3.87–5.11)
RDW: 13.4 % (ref 11.5–15.5)
WBC: 11 10*3/uL — ABNORMAL HIGH (ref 4.0–10.5)
nRBC: 0 % (ref 0.0–0.2)

## 2023-03-19 LAB — OSMOLALITY: Osmolality: 282 mosm/kg (ref 275–295)

## 2023-03-19 LAB — ABO/RH: ABO/RH(D): O POS

## 2023-03-19 LAB — TYPE AND SCREEN
ABO/RH(D): O POS
Antibody Screen: NEGATIVE

## 2023-03-19 LAB — SODIUM, URINE, RANDOM: Sodium, Ur: 118 mmol/L

## 2023-03-19 LAB — MRSA NEXT GEN BY PCR, NASAL: MRSA by PCR Next Gen: NOT DETECTED

## 2023-03-19 LAB — VITAMIN D 25 HYDROXY (VIT D DEFICIENCY, FRACTURES): Vit D, 25-Hydroxy: 46.59 ng/mL (ref 30–100)

## 2023-03-19 LAB — TROPONIN I (HIGH SENSITIVITY): Troponin I (High Sensitivity): 9 ng/L (ref <18)

## 2023-03-19 SURGERY — FIXATION, FEMUR, NECK, PERCUTANEOUS, USING SCREW
Anesthesia: General | Site: Wrist | Laterality: Left

## 2023-03-19 MED ORDER — PROPOFOL 10 MG/ML IV BOLUS
INTRAVENOUS | Status: AC
  Filled 2023-03-19: qty 20

## 2023-03-19 MED ORDER — CEFAZOLIN SODIUM-DEXTROSE 2-4 GM/100ML-% IV SOLN
2.0000 g | INTRAVENOUS | Status: AC
Start: 2023-03-19 — End: 2023-03-19
  Administered 2023-03-19: 2 g via INTRAVENOUS

## 2023-03-19 MED ORDER — LACTATED RINGERS IV SOLN: INTRAVENOUS | Status: DC

## 2023-03-19 MED ORDER — ASPIRIN 325 MG PO TBEC
325.0000 mg | DELAYED_RELEASE_TABLET | Freq: Every day | ORAL | Status: DC
  Administered 2023-03-19 – 2023-03-22 (×4): 325 mg via ORAL
  Filled 2023-03-19 (×4): qty 1

## 2023-03-19 MED ORDER — CHLORHEXIDINE GLUCONATE 0.12 % MT SOLN: 15.0000 mL | Freq: Once | OROMUCOSAL | Status: AC

## 2023-03-19 MED ORDER — ONDANSETRON HCL 4 MG/2ML IJ SOLN
INTRAMUSCULAR | Status: DC | PRN
  Administered 2023-03-19: 4 mg via INTRAVENOUS

## 2023-03-19 MED ORDER — ORAL CARE MOUTH RINSE: 15.0000 mL | Freq: Once | OROMUCOSAL | Status: AC

## 2023-03-19 MED ORDER — PHENYLEPHRINE 80 MCG/ML (10ML) SYRINGE FOR IV PUSH (FOR BLOOD PRESSURE SUPPORT)
PREFILLED_SYRINGE | INTRAVENOUS | Status: DC | PRN
  Administered 2023-03-19 (×3): 80 ug via INTRAVENOUS
  Administered 2023-03-19: 160 ug via INTRAVENOUS

## 2023-03-19 MED ORDER — HYDROCODONE-ACETAMINOPHEN 7.5-325 MG PO TABS
1.0000 | ORAL_TABLET | ORAL | Status: DC | PRN
  Administered 2023-03-20 – 2023-03-21 (×3): 1 via ORAL
  Administered 2023-03-21: 2 via ORAL
  Filled 2023-03-19: qty 2
  Filled 2023-03-19 (×3): qty 1

## 2023-03-19 MED ORDER — ACETAMINOPHEN 500 MG PO TABS
500.0000 mg | ORAL_TABLET | Freq: Four times a day (QID) | ORAL | Status: DC
  Administered 2023-03-19 – 2023-03-20 (×3): 500 mg via ORAL
  Filled 2023-03-19 (×3): qty 1

## 2023-03-19 MED ORDER — LIDOCAINE 2% (20 MG/ML) 5 ML SYRINGE
INTRAMUSCULAR | Status: AC
  Filled 2023-03-19: qty 5

## 2023-03-19 MED ORDER — FENTANYL CITRATE (PF) 100 MCG/2ML IJ SOLN: 25.0000 ug | INTRAMUSCULAR | Status: DC | PRN

## 2023-03-19 MED ORDER — CHLORHEXIDINE GLUCONATE 4 % EX SOLN: 60.0000 mL | Freq: Once | CUTANEOUS | Status: DC

## 2023-03-19 MED ORDER — DOCUSATE SODIUM 100 MG PO CAPS
100.0000 mg | ORAL_CAPSULE | Freq: Two times a day (BID) | ORAL | Status: DC
  Administered 2023-03-19 – 2023-03-22 (×7): 100 mg via ORAL
  Filled 2023-03-19 (×7): qty 1

## 2023-03-19 MED ORDER — ROCURONIUM BROMIDE 10 MG/ML (PF) SYRINGE
PREFILLED_SYRINGE | INTRAVENOUS | Status: AC
  Filled 2023-03-19: qty 10

## 2023-03-19 MED ORDER — HYDROCODONE-ACETAMINOPHEN 5-325 MG PO TABS
1.0000 | ORAL_TABLET | ORAL | Status: DC | PRN
  Administered 2023-03-19 – 2023-03-20 (×3): 1 via ORAL
  Filled 2023-03-19 (×3): qty 1

## 2023-03-19 MED ORDER — PHENYLEPHRINE HCL-NACL 20-0.9 MG/250ML-% IV SOLN
INTRAVENOUS | Status: DC | PRN
  Administered 2023-03-19: 40 ug/min via INTRAVENOUS

## 2023-03-19 MED ORDER — TRANEXAMIC ACID-NACL 1000-0.7 MG/100ML-% IV SOLN
INTRAVENOUS | Status: AC
  Filled 2023-03-19: qty 100

## 2023-03-19 MED ORDER — FENTANYL CITRATE (PF) 250 MCG/5ML IJ SOLN
INTRAMUSCULAR | Status: AC
  Filled 2023-03-19: qty 5

## 2023-03-19 MED ORDER — MORPHINE SULFATE (PF) 2 MG/ML IV SOLN
0.5000 mg | INTRAVENOUS | Status: DC | PRN
  Administered 2023-03-21: 1 mg via INTRAVENOUS
  Filled 2023-03-19 (×2): qty 1

## 2023-03-19 MED ORDER — OXYCODONE HCL 5 MG/5ML PO SOLN: 5.0000 mg | Freq: Once | ORAL | Status: DC | PRN

## 2023-03-19 MED ORDER — EPHEDRINE 5 MG/ML INJ
INTRAVENOUS | Status: AC
Start: 2023-03-19 — End: ?
  Filled 2023-03-19: qty 5

## 2023-03-19 MED ORDER — PHENYLEPHRINE 80 MCG/ML (10ML) SYRINGE FOR IV PUSH (FOR BLOOD PRESSURE SUPPORT)
PREFILLED_SYRINGE | INTRAVENOUS | Status: AC
  Filled 2023-03-19: qty 10

## 2023-03-19 MED ORDER — ACETAMINOPHEN 160 MG/5ML PO SOLN: 1000.0000 mg | Freq: Once | ORAL | Status: DC | PRN

## 2023-03-19 MED ORDER — ACETAMINOPHEN 10 MG/ML IV SOLN
1000.0000 mg | Freq: Once | INTRAVENOUS | Status: DC | PRN
Start: 2023-03-19 — End: 2023-03-19

## 2023-03-19 MED ORDER — TRANEXAMIC ACID-NACL 1000-0.7 MG/100ML-% IV SOLN
1000.0000 mg | INTRAVENOUS | Status: AC
Start: 2023-03-19 — End: 2023-03-19
  Administered 2023-03-19: 1000 mg via INTRAVENOUS

## 2023-03-19 MED ORDER — FENTANYL CITRATE (PF) 250 MCG/5ML IJ SOLN
INTRAMUSCULAR | Status: DC | PRN
  Administered 2023-03-19: 25 ug via INTRAVENOUS
  Administered 2023-03-19: 50 ug via INTRAVENOUS
  Administered 2023-03-19: 25 ug via INTRAVENOUS

## 2023-03-19 MED ORDER — ACETAMINOPHEN 500 MG PO TABS: 1000.0000 mg | ORAL_TABLET | Freq: Once | ORAL | Status: DC | PRN

## 2023-03-19 MED ORDER — OXYCODONE HCL 5 MG PO TABS: 5.0000 mg | ORAL_TABLET | Freq: Once | ORAL | Status: DC | PRN

## 2023-03-19 MED ORDER — VITAMIN D 25 MCG (1000 UNIT) PO TABS
1000.0000 [IU] | ORAL_TABLET | Freq: Every day | ORAL | Status: DC
  Administered 2023-03-19 – 2023-03-22 (×4): 1000 [IU] via ORAL
  Filled 2023-03-19 (×4): qty 1

## 2023-03-19 MED ORDER — ONDANSETRON HCL 4 MG/2ML IJ SOLN: 4.0000 mg | Freq: Four times a day (QID) | INTRAMUSCULAR | Status: DC | PRN

## 2023-03-19 MED ORDER — ONDANSETRON HCL 4 MG PO TABS: 4.0000 mg | ORAL_TABLET | Freq: Four times a day (QID) | ORAL | Status: DC | PRN

## 2023-03-19 MED ORDER — ADULT MULTIVITAMIN W/MINERALS CH
1.0000 | ORAL_TABLET | Freq: Every day | ORAL | Status: DC
  Administered 2023-03-19 – 2023-03-22 (×4): 1 via ORAL
  Filled 2023-03-19 (×4): qty 1

## 2023-03-19 MED ORDER — ENSURE ENLIVE PO LIQD
237.0000 mL | Freq: Two times a day (BID) | ORAL | Status: DC
  Administered 2023-03-19 – 2023-03-21 (×5): 237 mL via ORAL

## 2023-03-19 MED ORDER — CEFAZOLIN SODIUM-DEXTROSE 1-4 GM/50ML-% IV SOLN
INTRAVENOUS | Status: AC
  Filled 2023-03-19: qty 100

## 2023-03-19 MED ORDER — DEXAMETHASONE SODIUM PHOSPHATE 10 MG/ML IJ SOLN
INTRAMUSCULAR | Status: DC | PRN
  Administered 2023-03-19: 10 mg via INTRAVENOUS

## 2023-03-19 MED ORDER — ACETAMINOPHEN 325 MG PO TABS
325.0000 mg | ORAL_TABLET | Freq: Four times a day (QID) | ORAL | Status: DC | PRN
  Administered 2023-03-20: 650 mg via ORAL
  Filled 2023-03-19: qty 2

## 2023-03-19 MED ORDER — SUGAMMADEX SODIUM 200 MG/2ML IV SOLN
INTRAVENOUS | Status: DC | PRN
  Administered 2023-03-19: 200 mg via INTRAVENOUS

## 2023-03-19 MED ORDER — DEXAMETHASONE SODIUM PHOSPHATE 10 MG/ML IJ SOLN
INTRAMUSCULAR | Status: AC
  Filled 2023-03-19: qty 1

## 2023-03-19 MED ORDER — PROPOFOL 10 MG/ML IV BOLUS
INTRAVENOUS | Status: DC | PRN
  Administered 2023-03-19: 90 mg via INTRAVENOUS

## 2023-03-19 MED ORDER — ROCURONIUM BROMIDE 10 MG/ML (PF) SYRINGE
PREFILLED_SYRINGE | INTRAVENOUS | Status: DC | PRN
  Administered 2023-03-19: 40 mg via INTRAVENOUS

## 2023-03-19 MED ORDER — CHLORHEXIDINE GLUCONATE 0.12 % MT SOLN
OROMUCOSAL | Status: AC
  Administered 2023-03-19: 15 mL via OROMUCOSAL
  Filled 2023-03-19: qty 15

## 2023-03-19 MED ORDER — 0.9 % SODIUM CHLORIDE (POUR BTL) OPTIME
TOPICAL | Status: DC | PRN
  Administered 2023-03-19: 1000 mL

## 2023-03-19 MED ORDER — PHENYLEPHRINE HCL (PRESSORS) 10 MG/ML IV SOLN
INTRAVENOUS | Status: DC | PRN
  Administered 2023-03-19: 1599 ug via INTRAVENOUS

## 2023-03-19 MED ORDER — ONDANSETRON HCL 4 MG/2ML IJ SOLN
INTRAMUSCULAR | Status: AC
  Filled 2023-03-19: qty 2

## 2023-03-19 SURGICAL SUPPLY — 33 items
BAG COUNTER SPONGE SURGICOUNT (BAG) ×2 IMPLANT
BIT DRILL CANNULATED 5.0 (BIT) IMPLANT
BNDG COHESIVE 4X5 TAN STRL (GAUZE/BANDAGES/DRESSINGS) ×2 IMPLANT
BNDG GAUZE DERMACEA FLUFF 4 (GAUZE/BANDAGES/DRESSINGS) ×2 IMPLANT
COVER PERINEAL POST (MISCELLANEOUS) ×2 IMPLANT
COVER SURGICAL LIGHT HANDLE (MISCELLANEOUS) ×2 IMPLANT
DRAPE C-ARM 42X72 X-RAY (DRAPES) IMPLANT
DRAPE C-ARMOR (DRAPES) IMPLANT
DRAPE STERI IOBAN 125X83 (DRAPES) ×2 IMPLANT
DRESSING MEPILEX FLEX 4X4 (GAUZE/BANDAGES/DRESSINGS) ×2 IMPLANT
DRSG MEPILEX FLEX 4X4 (GAUZE/BANDAGES/DRESSINGS) ×4
DURAPREP 26ML APPLICATOR (WOUND CARE) ×2 IMPLANT
ELECT REM PT RETURN 15FT ADLT (MISCELLANEOUS) ×2 IMPLANT
GLOVE BIO SURGEON STRL SZ7.5 (GLOVE) ×4 IMPLANT
GLOVE BIOGEL PI IND STRL 7.5 (GLOVE) ×2 IMPLANT
GLOVE BIOGEL PI IND STRL 8 (GLOVE) ×2 IMPLANT
GOWN STRL REUS W/ TWL LRG LVL3 (GOWN DISPOSABLE) ×6 IMPLANT
KIT BASIN OR (CUSTOM PROCEDURE TRAY) ×2 IMPLANT
KIT TURNOVER KIT A (KITS) ×2 IMPLANT
MANIFOLD NEPTUNE II (INSTRUMENTS) ×2 IMPLANT
NS IRRIG 1000ML POUR BTL (IV SOLUTION) ×2 IMPLANT
PACK GENERAL/GYN (CUSTOM PROCEDURE TRAY) ×2 IMPLANT
PAD ARMBOARD 7.5X6 YLW CONV (MISCELLANEOUS) ×4 IMPLANT
PAD CAST 4YDX4 CTTN HI CHSV (CAST SUPPLIES) IMPLANT
PIN THD TIP GUIDE 3.2X12 (PIN) ×8 IMPLANT
SCOTCHCAST PLUS 3X4 WHITE (CAST SUPPLIES) IMPLANT
SCREW CANCELLOUS 7.0X75MM (Screw) IMPLANT
SCREW CANNULATED 7.0X80 (Screw) IMPLANT
STAPLER SKIN PROX WIDE 3.9 (STAPLE) IMPLANT
SUT MON AB 2-0 CT1 36 (SUTURE) ×2 IMPLANT
SUT VIC AB 0 CT1 27XBRD ANBCTR (SUTURE) IMPLANT
SUT VIC AB CT1 27XBRD ANBCTRL (SUTURE)
WATER STERILE IRR 1000ML POUR (IV SOLUTION) ×2 IMPLANT

## 2023-03-19 NOTE — Transfer of Care (Signed)
Immediate Anesthesia Transfer of Care Note  Patient: Alicia [name]  Procedure(s) Performed: PERCUTANEOUS PINNING OF LEFT HIP (Left: Hip) CLOSED REDUCTION LEFT WRIST (Left: Wrist)  Patient Location: PACU  Anesthesia Type:General  Level of Consciousness: drowsy  Airway & Oxygen Therapy: Patient Spontanous Breathing and Patient connected to nasal cannula oxygen  Post-op Assessment: Report given to RN and Post -op Vital signs reviewed and stable  Post vital signs: Reviewed and stable  Last Vitals:  Vitals Value Taken Time  BP 169/80 03/19/23 0900  Temp    Pulse 94 03/19/23 0902  Resp 18 03/19/23 0902  SpO2 92 % 03/19/23 0902  Vitals shown include unfiled device data.  Last Pain:  Vitals:   03/19/23 0228  TempSrc: Oral  PainSc: 0-No pain      Patients Stated Pain Goal: 0 (03/19/23 0630)  Complications: There were no known notable events for this encounter.

## 2023-03-19 NOTE — Progress Notes (Signed)
Initial Nutrition Assessment  DOCUMENTATION CODES:   Not applicable  INTERVENTION:  Continue with current diet Ensure Plus High Protein po BID, each supplement provides 350 kcal and 20 grams of protein. Multivitamin with minerals   NUTRITION DIAGNOSIS:   Increased nutrient needs related to hip fracture as evidenced by estimated needs.    GOAL:   Patient will meet greater than or equal to 90% of their needs    MONITOR:   PO intake, Supplement acceptance  REASON FOR ASSESSMENT:   Consult Assessment of nutrition requirement/status  ASSESSMENT:  89 y.o. F, admitted from long term care after an unwitnessed fall in common area of facility, resulting in Left hip: subcapital left femoral neck fracture, mildly impacted. Left wrist xray: acute transverse fracture through the distal radius, mildly impacted. Left knee xray: no acute fracture. Mild lateral compartment degenerative changes. Pmh; GERD/PUD, seizures, RLS, HTN, HLD, Dementia, depression and anxiety. Patient reached out to with no answer. All information obtained through EMR and team. No reports of appetite changes or significant weight loss. Resides in long term care facility where she was ambulatory and self feeding.  Review of labs revealed sodium runs between 127-133L.  Admit weight: 53.3 kg Current weight: 53.3kg  Weight history;  03/19/23 53.3 kg  03/30/22 51.6 kg  11/29/21 50.5 kg  07/12/21 51.2 kg  04/07/21 49.4 kg  12/17/20 48.2 kg     Average Meal Intake: Pt has been NPO with diet advanced 03/19/23 1129  Nutritionally Relevant Medications: Scheduled Meds:  amLODipine  10 mg Oral Daily   cholecalciferol  1,000 Units Oral Daily   docusate sodium  100 mg Oral BID   mirabegron ER  25 mg Oral Daily   sertraline  50 mg Oral Daily    Labs Reviewed: Na; 127 CBG ranges from 128-140 mg/dL over the last 24 hours     NUTRITION - FOCUSED PHYSICAL EXAM:  Deferred  Diet Order:   Diet Order              Diet regular Room service appropriate? Yes; Fluid consistency: Thin  Diet effective now                   EDUCATION NEEDS:   Not appropriate for education at this time  Skin:  Skin Assessment: Skin Integrity Issues: Skin Integrity Issues:: Incisions Incisions: left arm and Hip  Last BM:  11/24  Height:   Ht Readings from Last 1 Encounters:  03/19/23 5' 0.98" (1.549 m)    Weight:   Wt Readings from Last 1 Encounters:  03/19/23 53.3 kg    Ideal Body Weight:     BMI:  Body mass index is 22.21 kg/m.  Estimated Nutritional Needs:   Kcal:  1600-1900 kcal/day  Protein:  70-80 g/d  Fluid:  38ml/kcal    Jamelle Haring RDN, LDN Clinical Dietitian  RDN pager # available on Amion

## 2023-03-19 NOTE — Progress Notes (Signed)
Alicia [name]  UJW:[id number] DOB: Aug 17, 1933 DOA: 03/18/2023 PCP: [name], MD    Brief Narrative:  89 year old SNF resident with a history of peptic ulcer disease, seizures, RLS, HTN, HLD, dementia, and depression/anxiety who suffered an unwitnessed fall at her facility after which she complained of left leg pain and was noted to have left hand swelling.  In the ER x-rays revealed an acute transverse fracture through the distal radius and a subcapital left femoral neck fracture.  Goals of Care:   Code Status: Full Code   DVT prophylaxis: SCDs Start: 03/18/23 2144  Interim Hx: Afebrile overnight.  Vital signs stable.  The patient is resting comfortably in bed having recently returned from the OR.  She is in no distress.  She answers simple questions and has no new complaints.  Assessment & Plan:  Left femoral neck fracture Care per orthopedic surgery - underwent surgical correction in OR 11/24  Left distal radial transverse fracture Underwent closed reduction and casting in the OR 11/24  Hypokalemia Corrected with supplementation  Chronic hyponatremia Baseline sodium appears to be approximately 127-128 - discontinue chlorthalidone permanently  Seizure disorder No evidence of seizure activity -continue usual Keppra dose  HTN Chlorthalidone discontinued due to persisting hyponatremia -continue usual Norvasc dose  Dementia No behavioral disturbances presently  Anxiety/depression Continue usual Zoloft dose  Vitamin D deficiency Continue usual outpatient supplementation  Family Communication: Spoke with daughter at bedside Disposition: Anticipate need for SNF rehab stay   Objective: Blood pressure (!) 179/94, pulse 99, temperature 98 F (36.7 C), resp. rate 18, height 5' 0.98" (1.549 m), weight 53.3 kg, SpO2 90%.  Intake/Output Summary (Last 24 hours) at 03/19/2023 0951 Last data filed at 03/19/2023 0902 Gross per 24 hour  Intake 1247 ml  Output 25 ml  Net  1222 ml   Filed Weights   03/18/23 1653 03/19/23 0228 03/19/23 0655  Weight: 49 kg 53.3 kg 53.3 kg    Examination: General: No acute respiratory distress Lungs: Clear to auscultation bilaterally without wheezes or crackles Cardiovascular: Regular rate and rhythm without murmur gallop or rub normal S1 and S2 Abdomen: Nontender, nondistended, soft, bowel sounds positive, no rebound, no ascites, no appreciable mass Extremities: No significant cyanosis, clubbing, or edema bilateral lower extremities  CBC: Recent Labs  Lab 03/18/23 1931 03/19/23 0603  WBC 14.3* 11.0*  NEUTROABS 12.0*  --   HGB 13.3 12.4  HCT 39.5 36.6  MCV 85.9 84.3  PLT 237 216   Basic Metabolic Panel: Recent Labs  Lab 03/18/23 1931 03/18/23 2145 03/19/23 0603  NA 127*  --  127*  K 3.2*  --  3.7  CL 94*  --  95*  CO2 22  --  23  GLUCOSE 128*  --  140*  BUN 12  --  6*  CREATININE 0.65  --  0.55  CALCIUM 9.0  --  8.7*  MG  --  1.8  --    GFR: Estimated Creatinine Clearance: 37.4 mL/min (by C-G formula based on SCr of 0.55 mg/dL).   Scheduled Meds:  [MAR Hold] amLODipine  10 mg Oral Daily   aspirin EC  325 mg Oral Q breakfast   chlorhexidine  60 mL Topical Once   [MAR Hold] cholecalciferol  1,000 Units Oral Daily   [MAR Hold] levETIRAcetam  500 mg Oral BID   [MAR Hold] mirabegron ER  25 mg Oral Daily   [MAR Hold] sertraline  50 mg Oral Daily   Continuous Infusions:  ceFAZolin  lactated ringers 10 mL/hr at 03/19/23 0738     LOS: 1 day   Lonia Blood, MD [facility] Office  [phone] Pager - Text Page per Amion  If 7PM-7AM, please contact night-coverage per Amion 03/19/2023, 9:51 AM

## 2023-03-19 NOTE — Anesthesia Procedure Notes (Signed)
Procedure Name: Intubation Date/Time: 03/19/2023 8:03 AM  Performed by: Gloris Ham, CRNAPre-anesthesia Checklist: Patient identified, Emergency Drugs available, Suction available and Patient being monitored Patient Re-evaluated:Patient Re-evaluated prior to induction Oxygen Delivery Method: Circle system utilized Preoxygenation: Pre-oxygenation with 100% oxygen Induction Type: IV induction Ventilation: Mask ventilation without difficulty Laryngoscope Size: Mac and 3 Tube type: Oral Tube size: 7.0 mm Number of attempts: 1 Airway Equipment and Method: Stylet and Oral airway Placement Confirmation: ETT inserted through vocal cords under direct vision, positive ETCO2 and breath sounds checked- equal and bilateral Secured at: 19 cm Tube secured with: Tape Dental Injury: Teeth and Oropharynx as per pre-operative assessment

## 2023-03-19 NOTE — H&P (View-Only) (Signed)
ORTHOPAEDIC CONSULTATION  REQUESTING PHYSICIAN: Lonia Blood, MD  Chief Complaint: Left hip pain  HPI: Alicia [name] is a 89 y.o. female who presents with a ground-level fall after tripping mechanically at her rehab.  Since that time she has been in pain in the left thigh and left wrist.  She is seen in the emergency room.  She is placed in a sugar-tong splint on the left.  She was also found to have a left valgus impacted femoral neck fracture.  She is here with her daughter at the bedside.  She is a nursing home ambulator without any assistive devices.  She is in a memory care unit for dementia.  Past Medical History:  Diagnosis Date   Eye exam abnormal 11/21/14   Dr.Hecker-glaucoma suspect, cataracts, hemorrage   GERD (gastroesophageal reflux disease)    Hx of migraines    Hyperlipidemia    Macular degeneration    Osteoporosis    DEXA 03/2010 T-3.2 R hip; declines meds   PUD (peptic ulcer disease) 1980   Restless leg syndrome    Seizure disorder (HCC) 1997   evaluated by [facility] in past   Tinnitus 05/2009   DR BATES--related to change in med/generic.  Resolved   Vitamin D deficiency    Past Surgical History:  Procedure Laterality Date   CATARACT EXTRACTION Bilateral 11/2014, 01/2015   Dr. Elmer Picker   TONSILLECTOMY     TUBAL LIGATION     Social History   Socioeconomic History   Marital status: Widowed    Spouse name: Not on file   Number of children: 2   Years of education: Not on file   Highest education level: 12th grade  Occupational History   Occupation: retired (from Community education officer)  Tobacco Use   Smoking status: Never   Smokeless tobacco: Never  Vaping Use   Vaping status: Never Used  Substance and Sexual Activity   Alcohol use: No   Drug use: No   Sexual activity: Not Currently  Other Topics Concern   Not on file  Social History Narrative   Sold townhome, moved into [place]. Has pull cords in her rooms if she falls (no longer wears bracelet).    Widowed 2009.  1 daughter in [place] (has 3 kids), 1 daughter in [place] (2 children). 5 grandchildren total.  1 Great-grandchild (in [place])   Caffeine- coffee, 1 daily      Updated 10/2020   Social Determinants of Health   Financial Resource Strain: Low Risk  (07/08/2021)   Overall Financial Resource Strain (CARDIA)    Difficulty of Paying Living Expenses: Not very hard  Food Insecurity: No Food Insecurity (03/19/2023)   Hunger Vital Sign    Worried About Running Out of Food in the Last Year: Never true    Ran Out of Food in the Last Year: Never true  Transportation Needs: No Transportation Needs (03/19/2023)   PRAPARE - Administrator, Civil Service (Medical): No    Lack of Transportation (Non-Medical): No  Physical Activity: Insufficiently Active (07/08/2021)   Exercise Vital Sign    Days of Exercise per Week: 3 days    Minutes of Exercise per Session: 30 min  Stress: No Stress Concern Present (07/08/2021)   [employer] of Occupational Health - Occupational Stress Questionnaire    Feeling of Stress : Not at all  Social Connections: Unknown (07/08/2021)   Social Connection and Isolation Panel [NHANES]    Frequency of Communication with Friends and Family:  Once a week    Frequency of Social Gatherings with Friends and Family: More than three times a week    Attends Religious Services: Patient declined    Active Member of Clubs or Organizations: No    Attends Banker Meetings: Not on file    Marital Status: Widowed   Family History  Problem Relation Age of Onset   Heart disease Mother    Dementia Father    Alzheimer's disease Sister    Thyroid disease Sister        removed, thinks benign   Heart disease Brother        50's   Seizures Daughter 56   Diabetes Neg Hx    Cancer Neg Hx    - negative except otherwise stated in the family history section Allergies  Allergen Reactions   Sulfa Antibiotics Rash   Prior to Admission medications    Medication Sig Start Date End Date Taking? Authorizing Provider  amLODipine (NORVASC) 10 MG tablet Take 1 tablet (10 mg total) by mouth daily. 04/29/21  Yes Lewayne Bunting, MD  chlorthalidone (HYGROTON) 25 MG tablet Take 25 mg by mouth daily.   Yes [provider]  levETIRAcetam (KEPPRA) 500 MG tablet TAKE 1 TABLET BY MOUTH Yetta Barre *NDC [id number]* 11/18/21  Yes [name], MD  mirabegron ER (MYRBETRIQ) 25 MG TB24 tablet Take 25 mg by mouth daily.   Yes [provider]  sertraline (ZOLOFT) 50 MG tablet Take 50 mg by mouth daily.   Yes [provider]  Vitamin D, Cholecalciferol, 25 MCG (1000 UT) CAPS Take 1 capsule by mouth daily.   Yes [provider]  acetaminophen (TYLENOL) 325 MG tablet Take 325 mg by mouth every 6 (six) hours as needed. Reported on 09/09/2015 Patient not taking: Reported on 03/18/2023    [provider]  chlorthalidone (HYGROTON) 25 MG tablet Take 0.5 tablets (12.5 mg total) by mouth daily. 05/28/21 03/30/22  Lewayne Bunting, MD   DG Hip Unilat W or Wo Pelvis 2-3 Views Left  Result Date: 03/18/2023 CLINICAL DATA:  Fall EXAM: DG HIP (WITH OR WITHOUT PELVIS) 2-3V LEFT COMPARISON:  None. FINDINGS: The bones are osteopenic. There is subcapital left femoral neck fracture, mildly impacted. There is no dislocation. Joint spaces are maintained. Soft tissues are within normal limits. IMPRESSION: Subcapital left femoral neck fracture, mildly impacted. Electronically Signed   By: [name] M.D.   On: 03/18/2023 18:19   DG Knee Complete 4 Views Left  Result Date: 03/18/2023 CLINICAL DATA:  Fall EXAM: LEFT KNEE - COMPLETE 4+ VIEW COMPARISON:  None Available. FINDINGS: The bones are osteopenic. There is no acute fracture, dislocation or joint effusion. There is mild lateral compartment degenerative change with chondral calcification. IMPRESSION: 1. No acute fracture or dislocation. 2. Mild lateral compartment degenerative change.  Electronically Signed   By: [name] M.D.   On: 03/18/2023 18:17   DG Wrist Complete Left  Result Date: 03/18/2023 CLINICAL DATA:  Fall EXAM: LEFT WRIST - COMPLETE 3+ VIEW COMPARISON:  None Available. FINDINGS: The bones are diffusely osteopenic. There is an acute transverse fracture through the distal radius, mildly impacted. There is no dislocation. There is soft tissue swelling surrounding the wrist. There is radiocarpal joint space narrowing and calcification of the triangular fibrocartilage compatible with degenerative change. IMPRESSION: Acute transverse fracture through the distal radius, mildly impacted. Electronically Signed   By: [name] M.D.   On: 03/18/2023 18:16  Positive ROS: All other systems have been reviewed and were otherwise negative with the exception of those mentioned in the HPI and as above.  Physical Exam: General: No acute distress Cardiovascular: No pedal edema Respiratory: No cyanosis, no use of accessory musculature GI: No organomegaly, abdomen is soft and non-tender Skin: No lesions in the area of chief complaint Neurologic: Sensation intact distally Psychiatric: Patient is at baseline mood and affect Lymphatic: No axillary or cervical lymphadenopathy  MUSCULOSKELETAL:  Left leg with an equal leg length compared to the contralateral side.  And unable to comply with neurosensory exam although she does have a 2+ dorsalis pedis pulse.  Left wrist with a splint in place.  Exposed fingers are warm and well-perfused.  Able to make a spontaneous fist.  Independent Imaging Review: X-ray 3 view left wrist, 3 views left hip: Mild dorsal displacement about a distal radius fracture on the left as well as a valgus impacted femoral neck fracture  Assessment: 89 year old female with a dorsally displaced distal radius fracture on the left as well as a valgus impacted femoral neck fracture.  With regard to her fracture I discussed with her daughter that I would  recommend percutaneous screw fixation in order to stabilize the fracture and allow for early ambulation.  I did discuss the alternatives to surgery including a period of limited weightbearing.  I did discuss the risks of surgery.  With regard to the left wrist overall this is in quite good alignment and I do believe would do quite well with a cast to protect her while she begins early ambulation and platform weightbearing.  After discussion of the risks and benefits her daughter has elected for left hip percutaneous fixation and left wrist closed reduction with casting  Plan: Plan for left hip percutaneous screw fixation and left distal radius closed reduction with casting   After a lengthy discussion of treatment options, including risks, benefits, alternatives, complications of surgical and nonsurgical conservative options, the patient elected surgical repair.   The patient  is aware of the material risks  and complications including, but not limited to injury to adjacent structures, neurovascular injury, infection, numbness, bleeding, implant failure, thermal burns, stiffness, persistent pain, failure to heal, disease transmission from allograft, need for further surgery, dislocation, anesthetic risks, blood clots, risks of death,and others. The probabilities of surgical success and failure discussed with patient given their particular co-morbidities.The time and nature of expected rehabilitation and recovery was discussed.The patient's questions were all answered preoperatively.  No barriers to understanding were noted. I explained the natural history of the disease process and Rx rationale.  I explained to the patient what I considered to be reasonable expectations given their personal situation.  The final treatment plan was arrived at through a shared patient decision making process model.   Thank you for the consult and the opportunity to see Ms. Amalia Hailey, MD [facility] 7:00 AM

## 2023-03-19 NOTE — Consult Note (Signed)
ORTHOPAEDIC CONSULTATION  REQUESTING PHYSICIAN: Lonia Blood, MD  Chief Complaint: Left hip pain  HPI: Alicia [name] is a 89 y.o. female who presents with a ground-level fall after tripping mechanically at her rehab.  Since that time she has been in pain in the left thigh and left wrist.  She is seen in the emergency room.  She is placed in a sugar-tong splint on the left.  She was also found to have a left valgus impacted femoral neck fracture.  She is here with her daughter at the bedside.  She is a nursing home ambulator without any assistive devices.  She is in a memory care unit for dementia.  Past Medical History:  Diagnosis Date   Eye exam abnormal 11/21/14   Dr.Hecker-glaucoma suspect, cataracts, hemorrage   GERD (gastroesophageal reflux disease)    Hx of migraines    Hyperlipidemia    Macular degeneration    Osteoporosis    DEXA 03/2010 T-3.2 R hip; declines meds   PUD (peptic ulcer disease) 1980   Restless leg syndrome    Seizure disorder (HCC) 1997   evaluated by [facility] in past   Tinnitus 05/2009   DR BATES--related to change in med/generic.  Resolved   Vitamin D deficiency    Past Surgical History:  Procedure Laterality Date   CATARACT EXTRACTION Bilateral 11/2014, 01/2015   Dr. Elmer Picker   TONSILLECTOMY     TUBAL LIGATION     Social History   Socioeconomic History   Marital status: Widowed    Spouse name: Not on file   Number of children: 2   Years of education: Not on file   Highest education level: 12th grade  Occupational History   Occupation: retired (from Community education officer)  Tobacco Use   Smoking status: Never   Smokeless tobacco: Never  Vaping Use   Vaping status: Never Used  Substance and Sexual Activity   Alcohol use: No   Drug use: No   Sexual activity: Not Currently  Other Topics Concern   Not on file  Social History Narrative   Sold townhome, moved into [place]. Has pull cords in her rooms if she falls (no longer wears bracelet).    Widowed 2009.  1 daughter in [place] (has 3 kids), 1 daughter in [place] (2 children). 5 grandchildren total.  1 Great-grandchild (in [place])   Caffeine- coffee, 1 daily      Updated 10/2020   Social Determinants of Health   Financial Resource Strain: Low Risk  (07/08/2021)   Overall Financial Resource Strain (CARDIA)    Difficulty of Paying Living Expenses: Not very hard  Food Insecurity: No Food Insecurity (03/19/2023)   Hunger Vital Sign    Worried About Running Out of Food in the Last Year: Never true    Ran Out of Food in the Last Year: Never true  Transportation Needs: No Transportation Needs (03/19/2023)   PRAPARE - Administrator, Civil Service (Medical): No    Lack of Transportation (Non-Medical): No  Physical Activity: Insufficiently Active (07/08/2021)   Exercise Vital Sign    Days of Exercise per Week: 3 days    Minutes of Exercise per Session: 30 min  Stress: No Stress Concern Present (07/08/2021)   [employer] of Occupational Health - Occupational Stress Questionnaire    Feeling of Stress : Not at all  Social Connections: Unknown (07/08/2021)   Social Connection and Isolation Panel [NHANES]    Frequency of Communication with Friends and Family:  Once a week    Frequency of Social Gatherings with Friends and Family: More than three times a week    Attends Religious Services: Patient declined    Active Member of Clubs or Organizations: No    Attends Banker Meetings: Not on file    Marital Status: Widowed   Family History  Problem Relation Age of Onset   Heart disease Mother    Dementia Father    Alzheimer's disease Sister    Thyroid disease Sister        removed, thinks benign   Heart disease Brother        71's   Seizures Daughter 17   Diabetes Neg Hx    Cancer Neg Hx    - negative except otherwise stated in the family history section Allergies  Allergen Reactions   Sulfa Antibiotics Rash   Prior to Admission medications    Medication Sig Start Date End Date Taking? Authorizing Provider  amLODipine (NORVASC) 10 MG tablet Take 1 tablet (10 mg total) by mouth daily. 04/29/21  Yes Lewayne Bunting, MD  chlorthalidone (HYGROTON) 25 MG tablet Take 25 mg by mouth daily.   Yes [provider]  levETIRAcetam (KEPPRA) 500 MG tablet TAKE 1 TABLET BY MOUTH Yetta Barre *NDC [id number]* 11/18/21  Yes [name], MD  mirabegron ER (MYRBETRIQ) 25 MG TB24 tablet Take 25 mg by mouth daily.   Yes [provider]  sertraline (ZOLOFT) 50 MG tablet Take 50 mg by mouth daily.   Yes [provider]  Vitamin D, Cholecalciferol, 25 MCG (1000 UT) CAPS Take 1 capsule by mouth daily.   Yes [provider]  acetaminophen (TYLENOL) 325 MG tablet Take 325 mg by mouth every 6 (six) hours as needed. Reported on 09/09/2015 Patient not taking: Reported on 03/18/2023    [provider]  chlorthalidone (HYGROTON) 25 MG tablet Take 0.5 tablets (12.5 mg total) by mouth daily. 05/28/21 03/30/22  Lewayne Bunting, MD   DG Hip Unilat W or Wo Pelvis 2-3 Views Left  Result Date: 03/18/2023 CLINICAL DATA:  Fall EXAM: DG HIP (WITH OR WITHOUT PELVIS) 2-3V LEFT COMPARISON:  None. FINDINGS: The bones are osteopenic. There is subcapital left femoral neck fracture, mildly impacted. There is no dislocation. Joint spaces are maintained. Soft tissues are within normal limits. IMPRESSION: Subcapital left femoral neck fracture, mildly impacted. Electronically Signed   By: [name] M.D.   On: 03/18/2023 18:19   DG Knee Complete 4 Views Left  Result Date: 03/18/2023 CLINICAL DATA:  Fall EXAM: LEFT KNEE - COMPLETE 4+ VIEW COMPARISON:  None Available. FINDINGS: The bones are osteopenic. There is no acute fracture, dislocation or joint effusion. There is mild lateral compartment degenerative change with chondral calcification. IMPRESSION: 1. No acute fracture or dislocation. 2. Mild lateral compartment degenerative change.  Electronically Signed   By: [name] M.D.   On: 03/18/2023 18:17   DG Wrist Complete Left  Result Date: 03/18/2023 CLINICAL DATA:  Fall EXAM: LEFT WRIST - COMPLETE 3+ VIEW COMPARISON:  None Available. FINDINGS: The bones are diffusely osteopenic. There is an acute transverse fracture through the distal radius, mildly impacted. There is no dislocation. There is soft tissue swelling surrounding the wrist. There is radiocarpal joint space narrowing and calcification of the triangular fibrocartilage compatible with degenerative change. IMPRESSION: Acute transverse fracture through the distal radius, mildly impacted. Electronically Signed   By: [name] M.D.   On: 03/18/2023 18:16  Positive ROS: All other systems have been reviewed and were otherwise negative with the exception of those mentioned in the HPI and as above.  Physical Exam: General: No acute distress Cardiovascular: No pedal edema Respiratory: No cyanosis, no use of accessory musculature GI: No organomegaly, abdomen is soft and non-tender Skin: No lesions in the area of chief complaint Neurologic: Sensation intact distally Psychiatric: Patient is at baseline mood and affect Lymphatic: No axillary or cervical lymphadenopathy  MUSCULOSKELETAL:  Left leg with an equal leg length compared to the contralateral side.  And unable to comply with neurosensory exam although she does have a 2+ dorsalis pedis pulse.  Left wrist with a splint in place.  Exposed fingers are warm and well-perfused.  Able to make a spontaneous fist.  Independent Imaging Review: X-ray 3 view left wrist, 3 views left hip: Mild dorsal displacement about a distal radius fracture on the left as well as a valgus impacted femoral neck fracture  Assessment: 89 year old female with a dorsally displaced distal radius fracture on the left as well as a valgus impacted femoral neck fracture.  With regard to her fracture I discussed with her daughter that I would  recommend percutaneous screw fixation in order to stabilize the fracture and allow for early ambulation.  I did discuss the alternatives to surgery including a period of limited weightbearing.  I did discuss the risks of surgery.  With regard to the left wrist overall this is in quite good alignment and I do believe would do quite well with a cast to protect her while she begins early ambulation and platform weightbearing.  After discussion of the risks and benefits her daughter has elected for left hip percutaneous fixation and left wrist closed reduction with casting  Plan: Plan for left hip percutaneous screw fixation and left distal radius closed reduction with casting   After a lengthy discussion of treatment options, including risks, benefits, alternatives, complications of surgical and nonsurgical conservative options, the patient elected surgical repair.   The patient  is aware of the material risks  and complications including, but not limited to injury to adjacent structures, neurovascular injury, infection, numbness, bleeding, implant failure, thermal burns, stiffness, persistent pain, failure to heal, disease transmission from allograft, need for further surgery, dislocation, anesthetic risks, blood clots, risks of death,and others. The probabilities of surgical success and failure discussed with patient given their particular co-morbidities.The time and nature of expected rehabilitation and recovery was discussed.The patient's questions were all answered preoperatively.  No barriers to understanding were noted. I explained the natural history of the disease process and Rx rationale.  I explained to the patient what I considered to be reasonable expectations given their personal situation.  The final treatment plan was arrived at through a shared patient decision making process model.   Thank you for the consult and the opportunity to see Ms. Amalia Hailey, MD [facility] 7:00 AM

## 2023-03-19 NOTE — Anesthesia Preprocedure Evaluation (Addendum)
Anesthesia Evaluation  Patient identified by MRN, date of birth, ID band Patient confused    Reviewed: Allergy & Precautions, NPO status , Patient's Chart, lab work & pertinent test results  History of Anesthesia Complications Negative for: history of anesthetic complications  Airway Mallampati: III  TM Distance: >3 FB Neck ROM: Full    Dental  (+) Poor Dentition, Dental Advisory Given   Pulmonary neg COPD, neg recent URI   breath sounds clear to auscultation       Cardiovascular hypertension, Pt. on medications + Peripheral Vascular Disease   Rhythm:Regular     Neuro/Psych Seizures -,       Dementia    GI/Hepatic Neg liver ROS, PUD,GERD  ,,  Endo/Other  negative endocrine ROS    Renal/GU      Musculoskeletal left hip fracture, left distal radius fracture   Abdominal   Peds  Hematology negative hematology ROS (+)   Anesthesia Other Findings   Reproductive/Obstetrics                             Anesthesia Physical Anesthesia Plan  ASA: 3  Anesthesia Plan: General   Post-op Pain Management:    Induction: Intravenous  PONV Risk Score and Plan: 3 and Ondansetron and Dexamethasone  Airway Management Planned: Oral ETT  Additional Equipment: None  Intra-op Plan:   Post-operative Plan: Extubation in OR  Informed Consent: I have reviewed the patients History and Physical, chart, labs and discussed the procedure including the risks, benefits and alternatives for the proposed anesthesia with the patient or authorized representative who has indicated his/her understanding and acceptance.     Dental advisory given  Plan Discussed with: CRNA  Anesthesia Plan Comments:        Anesthesia Quick Evaluation

## 2023-03-19 NOTE — Anesthesia Postprocedure Evaluation (Signed)
Anesthesia Post Note  Patient: Alicia [name]  Procedure(s) Performed: PERCUTANEOUS PINNING OF LEFT HIP (Left: Hip) CLOSED REDUCTION LEFT WRIST (Left: Wrist)     Patient location during evaluation: PACU Anesthesia Type: General Level of consciousness: patient cooperative Pain management: pain level controlled Vital Signs Assessment: post-procedure vital signs reviewed and stable Respiratory status: spontaneous breathing, nonlabored ventilation and respiratory function stable Cardiovascular status: blood pressure returned to baseline and stable Postop Assessment: no apparent nausea or vomiting Anesthetic complications: no   There were no known notable events for this encounter.  Last Vitals:  Vitals:   03/19/23 0945 03/19/23 1000  BP: (!) 142/67 122/64  Pulse: 97 98  Resp: 17 17  Temp:    SpO2: 93% 93%    Last Pain:  Vitals:   03/19/23 0228  TempSrc: Oral  PainSc: 0-No pain                 Tyshana Nishida

## 2023-03-19 NOTE — Op Note (Signed)
Date of Surgery: 03/19/2023  INDICATIONS: Ms. Gines is a 89 y.o.-year-old female with left valgus impacted femoral neck fracture, dorsally displaced and angulated distal radius fracture.  The risk and benefits of the procedure were discussed in detail and documented in the pre-operative evaluation.   PREOPERATIVE DIAGNOSIS: 1.  Left valgus impacted femoral neck fracture 2.  Left dorsally angulated distal radius fracture  POSTOPERATIVE DIAGNOSIS: Same.  PROCEDURE: 1.  3 screws placement left femoral neck 2.  Closed reduction and casting left distal radius fracture  SURGEON: Benancio Deeds MD  ASSISTANT: Kerby Less, ATC  ANESTHESIA:  general  IV FLUIDS AND URINE: See anesthesia record.  ANTIBIOTICS: Ancef  ESTIMATED BLOOD LOSS: 10 mL.  IMPLANTS:  Implant Name Type Inv. Item Serial No. Manufacturer Lot No. LRB No. Used Action  SCREW CANNULATED 7.0X80 - [id number] Screw SCREW CANNULATED 7.0X80  ZIMMER RECON(ORTH,TRAU,BIO,SG) ON TRAY Left 1 Implanted  SCREW CANCELLOUS 7.0X75MM - [id number] Screw SCREW CANCELLOUS 7.0X75MM  ZIMMER RECON(ORTH,TRAU,BIO,SG) ON TRAY Left 2 Implanted    DRAINS: None  CULTURES: None  COMPLICATIONS: none  DESCRIPTION OF PROCEDURE:   The patient's chart/medical history was reviewed and decision was made to administer peri-operative trans-exemic acid.  I have reviewed the patient's history and given the presence of a fragility fracture, I have deemed the necessity for a osteoporosis management referral and placed in a postoperative referral to [place] [facility]  The patient was identified in the preoperative holding area.  The correct site was marked according universal protocol with nursing.  She was subsequently taken back to the operating room.  Anesthesia was induced.  The patient was prepped and draped in usual sterile fashion.  Again final timeout was performed.  A guidewire was used with fluoroscopy in order to localize the skin incision  about the lateral aspect of the trochanter.  10 blade was used to incise through skin and IT band.  A Cobb was used to elevate the vastus lateralis off the bone.  A wire was then introduced and this was placed in the inferior central position under direct AP and lateral fluoroscopy.  An additional anterior superior and posterior superior wire were then placed again under direct visualization.  Once we are happy with the placement of the wires, the drill was used to drill the lateral cortex.  A measuring guide was used off the lateral cortex to determine screw sizes.  The appropriately sized screw as noted above was placed in the correct positions.  These all had excellent purchase.  Guidewires were removed and dynamic fluoroscopy was used to confirm all screws to be in the bone.  The wound was thoroughly irrigated and closed in layers of 0 Vicryl 2-0 Vicryl and staples.  Aquacel dressing was applied.  All counts were correct at the end of the case.  She was awoken taken to the PACU without complication  At this point the dressings were all removed.  The left wrist was reduced with manual pressure and over the top maneuver on the distal radius.  A short arm cast was applied.  There was no pressure on any of the edges of the cast.  Fluoroscopy AP and lateral confirmed reduction with improved height and angulation profile.     POSTOPERATIVE PLAN: She will be weightbearing as tolerated on the left leg.  She will begin physical therapy immediately postop.  She may be platform weightbearing on this left arm/distal radius fracture.  She may begin anticoagulation with aspirin 325 mg  Viviann Spare  Tomi Bamberger, MD 8:48 AM

## 2023-03-19 NOTE — Progress Notes (Signed)
Patient transferred to preop waiting area via nurse and transport. Patient on tele monitor. Daughter at bedside. Patient in no acute distress.

## 2023-03-19 NOTE — Brief Op Note (Signed)
   Brief Op Note  Date of Surgery: 03/19/2023  Preoperative Diagnosis: left hip fracture, left distal radius fracture  Postoperative Diagnosis: same  Procedure: Procedure(s): PERCUTANEOUS PINNING OF LEFT HIP CLOSED REDUCTION LEFT WRIST  Implants: Implant Name Type Inv. Item Serial No. Manufacturer Lot No. LRB No. Used Action  SCREW CANNULATED 7.0X80 - [id number] Screw SCREW CANNULATED 7.0X80  ZIMMER RECON(ORTH,TRAU,BIO,SG) ON TRAY Left 1 Implanted  SCREW CANCELLOUS 7.0X75MM - [id number] Screw SCREW CANCELLOUS 7.0X75MM  ZIMMER RECON(ORTH,TRAU,BIO,SG) ON TRAY Left 2 Implanted    Surgeons: Surgeon(s): Huel Cote, MD  Anesthesia: General    Estimated Blood Loss: See anesthesia record  Complications: None  Condition to PACU: Stable  Benancio Deeds, MD 03/19/2023 8:48 AM

## 2023-03-19 NOTE — Interval H&P Note (Signed)
History and Physical Interval Note:  03/19/2023 7:03 AM  Alicia [name]  has presented today for surgery, with the diagnosis of left hip fracture, left distal radius fracture.  The various methods of treatment have been discussed with the patient and family. After consideration of risks, benefits and other options for treatment, the patient has consented to  Procedure(s): PERCUTANEOUS PINNING OF LEFT HIP (Left) CLOSED REDUCTION LEFT WRIST (Left) as a surgical intervention.  The patient's history has been reviewed, patient examined, no change in status, stable for surgery.  I have reviewed the patient's chart and labs.  Questions were answered to the patient's satisfaction.     Huel Cote

## 2023-03-19 NOTE — Discharge Instructions (Signed)
     Discharge Instructions    Attending Surgeon: Huel Cote, MD Office Phone Number: [phone]   Diagnosis and Procedures:    Surgeries Performed: Left hip pinning  Discharge Plan:    Activity:  You are weight bearing as tolerated left leg and you may platform weight bear through left arm.  GENERAL INSTRUCTIONS: 1.  Please apply ice to your wound to help with swelling and inflammation. This will improve your comfort and your overall recovery following surgery.     2. Please call Dr. Serena Croissant office at [phone] with questions Monday-Friday during business hours. If no one answers, please leave a message and someone should get back to the patient within 24 hours. For emergencies please call 911 or proceed to the emergency room.   3. Patient to notify surgical team if experiences any of the following: Bowel/Bladder dysfunction, uncontrolled pain, nerve/muscle weakness, incision with increased drainage or redness, nausea/vomiting and Fever greater than 101.0 F.  Be alert for signs of infection including redness, streaking, odor, fever or chills. Be alert for excessive pain or bleeding and notify your surgeon immediately.  WOUND INSTRUCTIONS:   Leave your dressing, cast, or splint in place until your post operative visit.  Keep it clean and dry.  Always keep the incision clean and dry until the staples/sutures are removed. If there is no drainage from the incision you should keep it open to air. If there is drainage from the incision you must keep it covered at all times until the drainage stops  Do not soak in a bath tub, hot tub, pool, lake or other body of water until 21 days after your surgery and your incision is completely dry and healed.  If you have removable sutures (or staples) they must be removed 10-14 days (unless otherwise instructed) from the day of your surgery.     1)  Elevate the extremity as much as possible.  2)  Keep the dressing clean and dry.  3)   Please call us if the dressing becomes wet or dirty.  4)  If you are experiencing worsening pain or worsening swelling, please call.     MEDICATIONS: Resume all previous home medications at the previous prescribed dose and frequency unless otherwise noted Start taking the  pain medications on an as-needed basis as prescribed  Please taper down pain medication over the next week following surgery.  Ideally you should not require a refill of any narcotic pain medication.  Take pain medication with food to minimize nausea. In addition to the prescribed pain medication, you may take over-the-counter pain relievers such as Tylenol.  Do NOT take additional tylenol if your pain medication already has tylenol in it.  Aspirin 325mg  daily per instructions on bottle. Narcotic policy: Per [facility] clinic policy, our goal is ensure optimal postoperative pain control with a multimodal pain management strategy. For all [facility] patients, our goal is to wean post-operative narcotic medications by 6 weeks post-operatively, and many times sooner. If this is not possible due to utilization of pain medication prior to surgery, your [facility] doctor will support your acute post-operative pain control for the first 6 weeks postoperatively, with a plan to transition you back to your primary pain team following that. Cyndia Skeeters will work to ensure a Therapist, occupational.

## 2023-03-20 ENCOUNTER — Encounter (HOSPITAL_COMMUNITY): Payer: Self-pay | Admitting: Orthopaedic Surgery

## 2023-03-20 DIAGNOSIS — S72002D Fracture of unspecified part of neck of left femur, subsequent encounter for closed fracture with routine healing: Secondary | ICD-10-CM | POA: Diagnosis not present

## 2023-03-20 LAB — CBC
HCT: 33.4 % — ABNORMAL LOW (ref 36.0–46.0)
Hemoglobin: 11.3 g/dL — ABNORMAL LOW (ref 12.0–15.0)
MCH: 28.9 pg (ref 26.0–34.0)
MCHC: 33.8 g/dL (ref 30.0–36.0)
MCV: 85.4 fL (ref 80.0–100.0)
Platelets: 184 10*3/uL (ref 150–400)
RBC: 3.91 MIL/uL (ref 3.87–5.11)
RDW: 13.7 % (ref 11.5–15.5)
WBC: 13.8 10*3/uL — ABNORMAL HIGH (ref 4.0–10.5)
nRBC: 0 % (ref 0.0–0.2)

## 2023-03-20 LAB — BASIC METABOLIC PANEL
Anion gap: 9 (ref 5–15)
BUN: 20 mg/dL (ref 8–23)
CO2: 26 mmol/L (ref 22–32)
Calcium: 8.8 mg/dL — ABNORMAL LOW (ref 8.9–10.3)
Chloride: 93 mmol/L — ABNORMAL LOW (ref 98–111)
Creatinine, Ser: 0.67 mg/dL (ref 0.44–1.00)
GFR, Estimated: >60 mL/min (ref >60)
Glucose, Bld: 127 mg/dL — ABNORMAL HIGH (ref 70–99)
Potassium: 4.1 mmol/L (ref 3.5–5.1)
Sodium: 128 mmol/L — ABNORMAL LOW (ref 135–145)

## 2023-03-20 LAB — TSH: TSH: 0.698 u[IU]/mL (ref 0.350–4.500)

## 2023-03-20 MED ORDER — MELATONIN 5 MG PO TABS
5.0000 mg | ORAL_TABLET | Freq: Once | ORAL | Status: AC
  Administered 2023-03-20: 5 mg via ORAL
  Filled 2023-03-20: qty 1

## 2023-03-20 MED ORDER — SODIUM CHLORIDE 0.9 % IV SOLN: INTRAVENOUS | Status: DC

## 2023-03-20 NOTE — Progress Notes (Signed)
   Subjective:  Patient reports pain as mild.  Tolerating diet.  Voiding spontaneously.  Objective:   VITALS:   Vitals:   03/19/23 1751 03/19/23 2101 03/20/23 0104 03/20/23 0400  BP: 116/68 (!) 102/58 115/69 118/68  Pulse: 94 84 86 78  Resp: 18  18 18   Temp: 98.2 F (36.8 C) 98.6 F (37 C) 98.4 F (36.9 C)   TempSrc: Oral Oral Oral   SpO2: 92% 93% 91% 92%  Weight:      Height:        Dressing is clean dry intact.  Sensation is intact to light touch throughout.  Fires EHL as well as tibialis anterior gastrocsoleus.  2+ dorsalis pedis pulse.  Limb lengths are equal  Lab Results  Component Value Date   WBC 11.0 (H) 03/19/2023   HGB 12.4 03/19/2023   HCT 36.6 03/19/2023   MCV 84.3 03/19/2023   PLT 216 03/19/2023     Assessment/Plan:  1 Day Post-Op status post left hip 3 screws placement as well as distal radius cast placement with reduction  - Expected postop acute blood loss anemia - will monitor for symptoms - Patient to work with PT to optimize mobilization safely - DVT ppx - SCDs, ambulation, aspirin 325 - WBAT operative left leg, may platform and walker weight-bear on the left arm as tolerated - Pain control - multimodal pain management, ATC acetaminophen in conjunction with as needed narcotic (oxycodone), although this should be minimized with other modalities   Alicia [name] 03/20/2023, 7:32 AM

## 2023-03-20 NOTE — Evaluation (Signed)
Physical Therapy Evaluation Patient Details Name: Alicia [name] MRN: [id number] DOB: 04-12-1934 Today's Date: 03/20/2023  History of Present Illness  Pt is an 89 year old SNF (memory care) resident admitted 03/18/23 after an unwitnessed fall at her facility. x-rays revealed an acute transverse fracture through the distal radius and a subcapital left femoral neck fracture. Now s/p left hip 3 screws, as well as distal radius cast placement with reduction 03/19/23. PMH includes peptic ulcer disease, seizures, RLS, HTN, HLD, dementia, and depression/anxiety.   Clinical Impression  Pt admitted with above diagnosis. Pt currently with functional limitations due to the deficits listed below (see PT Problem List). PTA family reports pt was fairly independent, making her bed every morning, ambulating without assistance or AD, and completing ADL's with supervision. At the time of PT eval pt was able to perform transfers with up to +2 max assist for power up, balance support and safety. Attempted transfers both with HHA and L platform RW. Pt successfully voided urine on BSC. Unable to advance LE's for step-pivot transfer and chair had to be pulled up behind pt to sit at end of session. Recommend Stedy use for transfer back to bed with nursing staff. Acutely, pt will benefit from acute skilled PT to increase their independence and safety with mobility to allow discharge.           If plan is discharge home, recommend the following: Two people to help with walking and/or transfers;Two people to help with bathing/dressing/bathroom;Assistance with cooking/housework;Direct supervision/assist for medications management;Direct supervision/assist for financial management;Assist for transportation;Help with stairs or ramp for entrance;Supervision due to cognitive status   Can travel by private vehicle   No    Equipment Recommendations Other (comment) (TBD by next venue of care)  Recommendations for Other  Services       Functional Status Assessment Patient has had a recent decline in their functional status and demonstrates the ability to make significant improvements in function in a reasonable and predictable amount of time.     Precautions / Restrictions Precautions Precautions: Fall Precaution Comments: Advanced dementia Required Braces or Orthoses: Splint/Cast Splint/Cast: L forearm, placed post-op by surgeon Splint/Cast - Date Prophylactic Dressing Applied (if applicable): 03/18/23 Restrictions Weight Bearing Restrictions: Yes LUE Weight Bearing: Weight bear through elbow only LLE Weight Bearing: Weight bearing as tolerated      Mobility  Bed Mobility Overal bed mobility: Needs Assistance Bed Mobility: Supine to Sit     Supine to sit: Max assist, +2 for physical assistance, +2 for safety/equipment, HOB elevated     General bed mobility comments: multimodal cues for sequencing throughout session, use of bed pad to faciliate hips coming EOB    Transfers Overall transfer level: Needs assistance Equipment used: Left platform walker, 1 person hand held assist Transfers: Sit to/from Stand, Bed to chair/wheelchair/BSC Sit to Stand: Max assist, +2 safety/equipment     Squat pivot transfers: +2 safety/equipment, Max assist     General transfer comment: Multimodal cues for WB through elbow only with LUE. Initial face to face transfer max A bed>BSC as pt urgently needed to void. For transfer attempt BSC>recliner use of L platform RW - max to come into standing. Currently unable to tolerate WB through LLE. Able to advance a few steps then unable to do any more due to pain.    Ambulation/Gait               General Gait Details: Unable to progress to gait training at this time.  Stairs  Wheelchair Mobility     Tilt Bed    Modified Rankin (Stroke Patients Only)       Balance Overall balance assessment: Needs assistance Sitting-balance support:  Single extremity supported, Feet supported Sitting balance-Leahy Scale: Poor Sitting balance - Comments: at least min to mod A for EOB seated balance Postural control: Posterior lean, Right lateral lean (to offweight L hip pain) Standing balance support: Single extremity supported, Bilateral upper extremity supported, Reliant on assistive device for balance Standing balance-Leahy Scale: Poor Standing balance comment: dependent on external support for balance                             Pertinent Vitals/Pain Pain Assessment Pain Assessment: Faces Faces Pain Scale: Hurts whole lot Pain Location: L hip Pain Descriptors / Indicators: Grimacing, Guarding, Heaviness, Operative site guarding, Discomfort, Sore Pain Intervention(s): Limited activity within patient's tolerance, Monitored during session, Repositioned    Home Living Family/patient expects to be discharged to:: Skilled nursing facility (memory care unit)                   Additional Comments: No AD.    Prior Function Prior Level of Function : Needs assist             Mobility Comments: MD has wanted her to use a rollator but has not yet. ADLs Comments: supervision, but performing her own bathing/dressing     Extremity/Trunk Assessment   Upper Extremity Assessment Upper Extremity Assessment: LUE deficits/detail LUE Deficits / Details: cast on forearm, using all digits appropriately LUE Coordination: decreased fine motor;decreased gross motor    Lower Extremity Assessment Lower Extremity Assessment: LLE deficits/detail LLE Deficits / Details: Acute pain, decreased strength and AROM consistent with above mentioned injury and subsequent surgery. LLE Coordination: decreased gross motor    Cervical / Trunk Assessment Cervical / Trunk Assessment: Kyphotic  Communication   Communication Communication: No apparent difficulties Cueing Techniques: Verbal cues;Gestural cues;Tactile cues  Cognition  Arousal: Alert, Suspect due to medications (but initially fell asleep once in the chair, easy to arouse) Behavior During Therapy: WFL for tasks assessed/performed, Anxious Overall Cognitive Status: History of cognitive impairments - at baseline (Daughter reports slightly worse due to pain medicine)                                 General Comments: lives in a memory care unit - history of feeling like she needs to urinate frequently, tends to perseverate on things        General Comments General comments (skin integrity, edema, etc.): 2 daughters present and supportive throughout    Exercises     Assessment/Plan    PT Assessment Patient needs continued PT services  PT Problem List Decreased strength;Decreased range of motion;Decreased activity tolerance;Decreased balance;Decreased mobility;Decreased cognition;Decreased knowledge of use of DME;Decreased safety awareness;Pain       PT Treatment Interventions DME instruction;Gait training;Stair training;Functional mobility training;Therapeutic activities;Therapeutic exercise;Balance training;Patient/family education    PT Goals (Current goals can be found in the Care Plan section)  Acute Rehab PT Goals Patient Stated Goal: None stated - family wants pt to be able to return to her memory care. PT Goal Formulation: With family Time For Goal Achievement: 04/03/23 Potential to Achieve Goals: Good    Frequency Min 1X/week     Co-evaluation PT/OT/SLP Co-Evaluation/Treatment: Yes Reason for Co-Treatment: Complexity of the patient's impairments (multi-system  involvement);Necessary to address cognition/behavior during functional activity;For patient/therapist safety;To address functional/ADL transfers PT goals addressed during session: Mobility/safety with mobility;Balance;Proper use of DME OT goals addressed during session: ADL's and self-care;Strengthening/ROM;Proper use of Adaptive equipment and DME       AM-PAC PT "6  Clicks" Mobility  Outcome Measure Help needed turning from your back to your side while in a flat bed without using bedrails?: A Lot Help needed moving from lying on your back to sitting on the side of a flat bed without using bedrails?: A Lot Help needed moving to and from a bed to a chair (including a wheelchair)?: Total Help needed standing up from a chair using your arms (e.g., wheelchair or bedside chair)?: Total Help needed to walk in hospital room?: Total Help needed climbing 3-5 steps with a railing? : Total 6 Click Score: 8    End of Session Equipment Utilized During Treatment: Gait belt Activity Tolerance: Patient limited by pain Patient left: in chair;with call bell/phone within reach;with chair alarm set Nurse Communication: Mobility status;Need for lift equipment [facility] for back to bed) PT Visit Diagnosis: Unsteadiness on feet (R26.81);Pain;History of falling (Z91.81) Pain - Right/Left: Left Pain - part of body: Hip    Time: 2952-8413 PT Time Calculation (min) (ACUTE ONLY): 27 min   Charges:   PT Evaluation $PT Eval Moderate Complexity: 1 Mod   PT General Charges $$ ACUTE PT VISIT: 1 Visit         Conni Slipper, PT, DPT Acute Rehabilitation Services Secure Chat Preferred Office: [phone]   Marylynn Pearson 03/20/2023, 12:45 PM

## 2023-03-20 NOTE — Care Management Important Message (Signed)
Important Message  Patient Details  Name: Alicia [name] MRN: [id number] Date of Birth: 15-Jul-1933   Important Message Given:  Yes - Medicare IM     Sherilyn Banker 03/20/2023, 3:18 PM

## 2023-03-20 NOTE — Progress Notes (Signed)
Transition of Care [facility]) - CAGE-AID Screening   Patient Details  Name: Alicia [name] MRN: [id number] Date of Birth: 27-Dec-1933   Hewitt Shorts, RN Trauma Response Nurse Phone Number: [phone] 03/20/2023, 9:11 AM   Clinical Narrative:    CAGE-AID Screening:    Have You Ever Felt You Ought to Cut Down on Your Drinking or Drug Use?: No Have People Annoyed You By [employer] Your Drinking Or Drug Use?: No Have You Felt Bad Or Guilty About Your Drinking Or Drug Use?: No Have You Ever Had a Drink or Used Drugs First Thing In The Morning to Steady Your Nerves or to Get Rid of a Hangover?: No CAGE-AID Score: 0  Substance Abuse Education Offered: (S) No (No services needed)

## 2023-03-20 NOTE — Evaluation (Signed)
Occupational Therapy Evaluation Patient Details Name: Alicia [name] MRN: [id number] DOB: November 26, 1933 Today's Date: 03/20/2023   History of Present Illness Pt is an 89 year old SNF (memory care) resident admitted 03/18/23 after an unwitnessed fall at her facility. x-rays revealed an acute transverse fracture through the distal radius and a subcapital left femoral neck fracture. Now s/p left hip 3 screws, as well as distal radius cast placement with reduction 03/19/23. PMH includes peptic ulcer disease, seizures, RLS, HTN, HLD, dementia, and depression/anxiety.   Clinical Impression   Pt is typically ambulatory without DME, and supervision level for ADL at her memory care unit. Meals and medicine management are provided for patient. Today she is limited by pain in LLE (hip), but was able to complete bed mobility at max A +2, pivot transfer to [facility] with max to total A of one person face to face transfer, and then sit<>stand with max A L platform RW. She is currently unable to tolerate WB through LLE to progress movement of RLE for mobility. LUE is in cast, and she moves shoulder, elbow and exposed digits WFL. She attempted to push and pull with LUE and required redirection throughout session. In unsupported sitting she required max A for UB ADL, and is max to total A for LB ADL at this time. Both daughters feel like her pain medicines were affecting her mentation at the time of evaluation. OT will follow acutely and recommending rehab of <3 hours daily to maximize safety and independence in ADL and functional transfers.       If plan is discharge home, recommend the following: Two people to help with walking and/or transfers;A lot of help with bathing/dressing/bathroom;Assist for transportation;Help with stairs or ramp for entrance    Functional Status Assessment  Patient has had a recent decline in their functional status and demonstrates the ability to make significant improvements in function in a  reasonable and predictable amount of time.  Equipment Recommendations  BSC/3in1;Other (comment) (defer to next venue of care)    Recommendations for Other Services PT consult     Precautions / Restrictions Precautions Precautions: Fall Precaution Comments: Advanced dementia Required Braces or Orthoses: Splint/Cast Splint/Cast: L forearm, placed post-op by surgeon Splint/Cast - Date Prophylactic Dressing Applied (if applicable): 03/18/23 Restrictions Weight Bearing Restrictions: Yes LUE Weight Bearing: Weight bear through elbow only LLE Weight Bearing: Weight bearing as tolerated      Mobility Bed Mobility Overal bed mobility: Needs Assistance Bed Mobility: Supine to Sit     Supine to sit: Max assist, +2 for physical assistance, +2 for safety/equipment, HOB elevated     General bed mobility comments: multimodal cues for sequencing throughout session, use of bed pad to faciliate hips coming EOB    Transfers Overall transfer level: Needs assistance Equipment used: Left platform walker, 1 person hand held assist Transfers: Sit to/from Stand, Bed to chair/wheelchair/BSC Sit to Stand: Max assist, +2 safety/equipment (initial face to face transfer max A, same for use of L platform RW - max to come into standing)   Squat pivot transfers: +2 safety/equipment, Max assist (max A to come to standing, currently unable to tolerate WB through LLE to progress RLE for pivotal steps.)       General transfer comment: multimodal cues for WB through elbow only with LUE.      Balance Overall balance assessment: Needs assistance Sitting-balance support: Single extremity supported, Feet supported Sitting balance-Leahy Scale: Poor Sitting balance - Comments: at least min to mod A for EOB seated  balance Postural control: Posterior lean, Right lateral lean (to offweight L hip pain) Standing balance support: Single extremity supported, Bilateral upper extremity supported, Reliant on assistive  device for balance Standing balance-Leahy Scale: Poor Standing balance comment: dependent on external support for balance                           ADL either performed or assessed with clinical judgement   ADL Overall ADL's : Needs assistance/impaired Eating/Feeding: Set up   Grooming: Maximal assistance;Wash/dry face;Sitting Grooming Details (indicate cue type and reason): "I can't do it" in seated position, suspect she can do basic facial grooming with less assist at bed level or supported sitting Upper Body Bathing: Moderate assistance Upper Body Bathing Details (indicate cue type and reason): for back Lower Body Bathing: Maximal assistance   Upper Body Dressing : Maximal assistance   Lower Body Dressing: Total assistance;Bed level   Toilet Transfer: Total assistance;Squat-pivot;BSC/3in1 Toilet Transfer Details (indicate cue type and reason): face to face due to urgency Toileting- Clothing Manipulation and Hygiene: Maximal assistance;+2 for physical assistance;+2 for safety/equipment;Sit to/from stand Toileting - Clothing Manipulation Details (indicate cue type and reason): with Max A pt standing and OT performing peri care     Functional mobility during ADLs: Maximal assistance;+2 for physical assistance;+2 for safety/equipment (L platform RW) General ADL Comments: decreased awareness of WB precautions, needs assist for BUE tasks, unable to maintain weight on LLE to lift RLE off the floor for mobility currently     Vision Patient Visual Report: No change from baseline       Perception         Praxis         Pertinent Vitals/Pain Pain Assessment Pain Assessment: Faces Faces Pain Scale: Hurts whole lot Pain Location: L hip Pain Descriptors / Indicators: Grimacing, Guarding, Heaviness, Operative site guarding, Discomfort, Sore Pain Intervention(s): Limited activity within patient's tolerance, Monitored during session, Premedicated before session,  Repositioned     Extremity/Trunk Assessment Upper Extremity Assessment Upper Extremity Assessment: LUE deficits/detail LUE Deficits / Details: cast on forearm, using all digits appropriately LUE Coordination: decreased fine motor;decreased gross motor   Lower Extremity Assessment Lower Extremity Assessment: Defer to PT evaluation   Cervical / Trunk Assessment Cervical / Trunk Assessment: Kyphotic   Communication Communication Communication: No apparent difficulties Cueing Techniques: Verbal cues;Gestural cues;Tactile cues   Cognition Arousal: Alert, Suspect due to medications (but initially fell asleep once in the chair, easy to arouse) Behavior During Therapy: WFL for tasks assessed/performed, Anxious Overall Cognitive Status: History of cognitive impairments - at baseline (Daughter reports slightly worse due to pain medicine)                                 General Comments: lives in a memory care unit - history of feeling like she needs to urinate frequently, tends to perseverate on things     General Comments  2 daugters present throughout session.    Exercises     Shoulder Instructions      Home Living Family/patient expects to be discharged to:: Skilled nursing facility (memory care unit)                                 Additional Comments: No AD.      Prior Functioning/Environment Prior Level of Function :  Needs assist             Mobility Comments: MD has wanted her to use a rollator but has not yet. ADLs Comments: supervision, but performing her own bathing/dressing        OT Problem List: Decreased strength;Decreased range of motion;Decreased activity tolerance;Impaired balance (sitting and/or standing);Decreased safety awareness;Decreased knowledge of use of DME or AE;Impaired UE functional use;Pain      OT Treatment/Interventions: Self-care/ADL training;Therapeutic exercise;DME and/or AE instruction;Therapeutic  activities;Patient/family education;Balance training    OT Goals(Current goals can be found in the care plan section) Acute Rehab OT Goals Patient Stated Goal: decrease pain OT Goal Formulation: With patient/family Time For Goal Achievement: 04/03/23 Potential to Achieve Goals: Fair ADL Goals Pt Will Perform Grooming: with set-up;sitting Pt Will Perform Upper Body Dressing: with set-up;sitting Pt Will Perform Lower Body Dressing: with mod assist;sitting/lateral leans Pt Will Transfer to Toilet: with mod assist;stand pivot transfer;bedside commode Pt Will Perform Toileting - Clothing Manipulation and hygiene: with mod assist;sit to/from stand;with caregiver independent in assisting Additional ADL Goal #1: Pt will perform bed mobility with min A prior to engaging in ADL  OT Frequency: Min 1X/week    Co-evaluation PT/OT/SLP Co-Evaluation/Treatment: Yes Reason for Co-Treatment: Complexity of the patient's impairments (multi-system involvement);Necessary to address cognition/behavior during functional activity;For patient/therapist safety;To address functional/ADL transfers PT goals addressed during session: Mobility/safety with mobility;Balance;Proper use of DME OT goals addressed during session: ADL's and self-care;Strengthening/ROM;Proper use of Adaptive equipment and DME      AM-PAC OT "6 Clicks" Daily Activity     Outcome Measure Help from another person eating meals?: A Lot Help from another person taking care of personal grooming?: A Lot Help from another person toileting, which includes using toliet, bedpan, or urinal?: Total Help from another person bathing (including washing, rinsing, drying)?: A Lot Help from another person to put on and taking off regular upper body clothing?: A Lot Help from another person to put on and taking off regular lower body clothing?: A Lot 6 Click Score: 11   End of Session Equipment Utilized During Treatment: Gait belt (L platform RW) Nurse  Communication: Mobility status;Precautions;Weight bearing status (needs a purewick)  Activity Tolerance: Patient limited by pain Patient left: in chair;with call bell/phone within reach;with chair alarm set;with family/visitor present  OT Visit Diagnosis: Unsteadiness on feet (R26.81);Other abnormalities of gait and mobility (R26.89);History of falling (Z91.81);Pain Pain - Right/Left: Left Pain - part of body: Arm;Hip                Time: 1610-9604 OT Time Calculation (min): 27 min Charges:  OT General Charges $OT Visit: 1 Visit OT Evaluation $OT Eval Moderate Complexity: 1 Mod  Nyoka Cowden OTR/L Acute Rehabilitation Services Office: [phone]  Evern Bio [facility] 03/20/2023, 11:43 AM

## 2023-03-20 NOTE — Progress Notes (Signed)
Alicia [name]  OZH:[id number] DOB: 06-19-1933 DOA: 03/18/2023 PCP: [name], MD    Brief Narrative:  89 year old SNF resident with a history of peptic ulcer disease, seizures, RLS, HTN, HLD, dementia, and depression/anxiety who suffered an unwitnessed fall at her facility after which she complained of left leg pain and was noted to have left hand swelling.  In the ER x-rays revealed an acute transverse fracture through the distal radius and a subcapital left femoral neck fracture.  Goals of Care:   Code Status: Full Code   DVT prophylaxis: SCDs Start: 03/19/23 1130  Interim Hx: No acute events recorded overnight.  Afebrile.  Vital signs stable.  Resting comfortably in bed.  No evidence of acute distress.  Denies complaints.  In good spirits.  Assessment & Plan:  Left femoral neck fracture Care per Orthopedic Surgery - underwent surgical correction in OR 11/24 -care recommendations per orthopedics as follows: - DVT ppx - SCDs, ambulation, aspirin 325 - WBAT operative left leg, may platform and walker weight-bear on the left arm as tolerated  Left distal radial transverse fracture Underwent closed reduction and casting in the OR 11/24  Hypokalemia Corrected with supplementation  Chronic hyponatremia Baseline sodium appears to be approximately 127-128 - discontinue chlorthalidone permanently  Seizure disorder No evidence of seizure activity - continue usual Keppra dose  HTN Chlorthalidone discontinued due to persisting hyponatremia -with persisting systolic blood pressures in the 96-118 range will discontinue Norvasc and follow without medical therapy  Dementia No behavioral disturbances presently  Anxiety/depression Continue usual Zoloft dose  Vitamin D deficiency Continue usual outpatient supplementation  Family Communication: Spoke with daughter at bedside Disposition: stable for discharge when SNF rehab arranged   Objective: Blood pressure 96/63, pulse 76,  temperature 97.8 F (36.6 C), temperature source Oral, resp. rate 18, height 5' 0.98" (1.549 m), weight 53.3 kg, SpO2 92%.  Intake/Output Summary (Last 24 hours) at 03/20/2023 0839 Last data filed at 03/20/2023 0400 Gross per 24 hour  Intake 1961 ml  Output 325 ml  Net 1636 ml   Filed Weights   03/18/23 1653 03/19/23 0228 03/19/23 0655  Weight: 49 kg 53.3 kg 53.3 kg    Examination: General: No acute respiratory distress Lungs: Clear to auscultation bilaterally without wheezes or crackles Cardiovascular: Regular rate and rhythm without murmur  Abdomen: NT/ND, BS positive, no rebound Extremities: No significant cyanosis, clubbing, or edema bilateral lower extremities  CBC: Recent Labs  Lab 03/18/23 1931 03/19/23 0603 03/20/23 0642  WBC 14.3* 11.0* 13.8*  NEUTROABS 12.0*  --   --   HGB 13.3 12.4 11.3*  HCT 39.5 36.6 33.4*  MCV 85.9 84.3 85.4  PLT 237 216 184   Basic Metabolic Panel: Recent Labs  Lab 03/18/23 1931 03/18/23 2145 03/19/23 0603 03/20/23 0642  NA 127*  --  127* 128*  K 3.2*  --  3.7 4.1  CL 94*  --  95* 93*  CO2 22  --  23 26  GLUCOSE 128*  --  140* 127*  BUN 12  --  6* 20  CREATININE 0.65  --  0.55 0.67  CALCIUM 9.0  --  8.7* 8.8*  MG  --  1.8  --   --    GFR: Estimated Creatinine Clearance: 37.4 mL/min (by C-G formula based on SCr of 0.67 mg/dL).   Scheduled Meds:  acetaminophen  500 mg Oral Q6H   amLODipine  10 mg Oral Daily   aspirin EC  325 mg Oral Q breakfast  cholecalciferol  1,000 Units Oral Daily   docusate sodium  100 mg Oral BID   feeding supplement  237 mL Oral BID BM   levETIRAcetam  500 mg Oral BID   mirabegron ER  25 mg Oral Daily   multivitamin with minerals  1 tablet Oral Daily   sertraline  50 mg Oral Daily     LOS: 2 days   Lonia Blood, MD [facility] Office  [phone] Pager - Text Page per Loretha Stapler  If 7PM-7AM, please contact night-coverage per Amion 03/20/2023, 8:39 AM

## 2023-03-20 NOTE — Progress Notes (Signed)
[facility] J. Garner Nash NP notified via chat that patient is very agitated and fixated on urination and attempting to remove her mitten and cast. New orders received.

## 2023-03-21 ENCOUNTER — Encounter (HOSPITAL_COMMUNITY): Payer: Self-pay | Admitting: Orthopaedic Surgery

## 2023-03-21 DIAGNOSIS — S72002D Fracture of unspecified part of neck of left femur, subsequent encounter for closed fracture with routine healing: Secondary | ICD-10-CM | POA: Diagnosis not present

## 2023-03-21 LAB — BASIC METABOLIC PANEL
Anion gap: 7 (ref 5–15)
BUN: 12 mg/dL (ref 8–23)
CO2: 26 mmol/L (ref 22–32)
Calcium: 8.3 mg/dL — ABNORMAL LOW (ref 8.9–10.3)
Chloride: 99 mmol/L (ref 98–111)
Creatinine, Ser: 0.55 mg/dL (ref 0.44–1.00)
GFR, Estimated: >60 mL/min (ref >60)
Glucose, Bld: 110 mg/dL — ABNORMAL HIGH (ref 70–99)
Potassium: 3.6 mmol/L (ref 3.5–5.1)
Sodium: 132 mmol/L — ABNORMAL LOW (ref 135–145)

## 2023-03-21 MED ORDER — CEPHALEXIN 500 MG PO CAPS
500.0000 mg | ORAL_CAPSULE | Freq: Two times a day (BID) | ORAL | Status: DC
  Administered 2023-03-21 – 2023-03-22 (×3): 500 mg via ORAL
  Filled 2023-03-21 (×3): qty 1

## 2023-03-21 MED ORDER — QUETIAPINE FUMARATE 25 MG PO TABS
25.0000 mg | ORAL_TABLET | Freq: Every day | ORAL | Status: DC
  Administered 2023-03-21: 25 mg via ORAL
  Filled 2023-03-21: qty 1

## 2023-03-21 NOTE — Progress Notes (Signed)
This patient has not had any sleep tonight she has been very restless despite pain muscle relaxer and melatonin. She is fixated on urination

## 2023-03-21 NOTE — Progress Notes (Signed)
Alicia [name]  WGN:[id number] DOB: [birth year] DOA: 03/18/2023 PCP: No primary care provider on file.    Brief Narrative:  89 year old SNF resident with a history of peptic ulcer disease, seizures, RLS, HTN, HLD, dementia, and depression/anxiety who suffered an unwitnessed fall at her facility after which she complained of left leg pain and was noted to have left hand swelling.  In the ER x-rays revealed an acute transverse fracture through the distal radius and a subcapital left femoral neck fracture.  Goals of Care:   Code Status: Full Code   DVT prophylaxis: SCDs Start: 03/19/23 1130  Interim Hx: The patient reportedly had a restless night.  No other acute events were reported.  She has remained afebrile.  Vital signs are stable. She is resting quietly at the time of my visit. There is no respiratory distress or evidence of uncontrolled pain.   Assessment & Plan:  Left femoral neck fracture Care per Orthopedic Surgery - underwent surgical correction in OR 11/24 -care recommendations per orthopedics as follows: - DVT ppx - SCDs, ambulation, aspirin 325 - WBAT operative left leg, may platform and walker weight-bear on the left arm as tolerated  Left distal radial transverse fracture Underwent closed reduction and casting in the OR 11/24  Abnormal UA POA Patient was agitated with urinary symptoms noted last night -will treat with short 3-day course of antibiotic therapy for possible UTI POA - family reports that fixation on urination is a frequent issue for her   Hypokalemia Corrected with supplementation  Chronic hyponatremia Baseline sodium appears to be approximately 127-128 - discontinued chlorthalidone permanently - sodium slowly improving in absence of diuretic  Seizure disorder No evidence of seizure activity - continue usual Keppra dose  HTN Chlorthalidone discontinued due to persisting hyponatremia - with persisting systolic blood pressures in the 96-118 range Norvasc  was discontinued - no indication for strict BP control given advanced age   Dementia Utilize nightly seroquel to help re-establish sleep/wake cycle   Anxiety/depression Continue usual Zoloft dose  Vitamin D deficiency Continue usual outpatient supplementation  Family Communication: spoke w/ 2 daughters at bedside at length  Disposition: stable for discharge when SNF rehab arranged   Objective: Blood pressure (!) 96/58, pulse 82, temperature 98.6 F (37 C), temperature source Oral, resp. rate 20, height 5' 0.98" (1.549 m), weight 53.3 kg, SpO2 95%.  Intake/Output Summary (Last 24 hours) at 03/21/2023 0908 Last data filed at 03/21/2023 0300 Gross per 24 hour  Intake --  Output 800 ml  Net -800 ml   Filed Weights   03/18/23 1653 03/19/23 0228 03/19/23 0655  Weight: 49 kg 53.3 kg 53.3 kg    Examination: General: No acute respiratory distress Lungs: Clear to auscultation bilaterally - no wheezing  Cardiovascular: Regular rate and rhythm without murmur  Abdomen: NT/ND, BS positive, no rebound Extremities: No signif edema bilateral lower extremities  CBC: Recent Labs  Lab 03/18/23 1931 03/19/23 0603 03/20/23 0642  WBC 14.3* 11.0* 13.8*  NEUTROABS 12.0*  --   --   HGB 13.3 12.4 11.3*  HCT 39.5 36.6 33.4*  MCV 85.9 84.3 85.4  PLT 237 216 184   Basic Metabolic Panel: Recent Labs  Lab 03/18/23 2145 03/19/23 0603 03/20/23 0642 03/21/23 0601  NA  --  127* 128* 132*  K  --  3.7 4.1 3.6  CL  --  95* 93* 99  CO2  --  23 26 26   GLUCOSE  --  140* 127* 110*  BUN  --  6* 20 12  CREATININE  --  0.55 0.67 0.55  CALCIUM  --  8.7* 8.8* 8.3*  MG 1.8  --   --   --    GFR: Estimated Creatinine Clearance: 37.4 mL/min (by C-G formula based on SCr of 0.55 mg/dL).   Scheduled Meds:  aspirin EC  325 mg Oral Q breakfast   cholecalciferol  1,000 Units Oral Daily   docusate sodium  100 mg Oral BID   feeding supplement  237 mL Oral BID BM   levETIRAcetam  500 mg Oral BID    mirabegron ER  25 mg Oral Daily   multivitamin with minerals  1 tablet Oral Daily   sertraline  50 mg Oral Daily     LOS: 3 days   Lonia Blood, MD [facility] Office  [phone] Pager - Text Page per Loretha Stapler  If 7PM-7AM, please contact night-coverage per Amion 03/21/2023, 9:08 AM

## 2023-03-21 NOTE — TOC Initial Note (Addendum)
Transition of Care [facility]) - Initial/Assessment Note    Patient Details  Name: Alicia [name] MRN: [id number] Date of Birth: 09-06-1933  [facility]) CM/SW Contact:    Alicia Lesches, RN Phone Number: 03/21/2023, 12:42 PM  Clinical Narrative:    Presents after a fall @ [facility] ( memory care unit). Pt with hx of  GERD/PUD, seizures, RLS, HTN, HLD, Dementia, depression and anxiety.   - s/p PERCUTANEOUS PINNING OF LEFT HIP (Left: Hip) 11/24 CLOSED REDUCTION LEFT WRIST (Left: Wrist),   RNCM received consult for possible SNF placement at time of discharge. RNCM spoke with patient's daughters regarding PT recommendation of SNF placement at time of discharge.Daughters  expressed understanding of PT recommendation and is agreeable to SNF placement at time of discharge.Daughter reports preference for   [facility] after doing a tour.  States hoping pt can transition there today. RNCM discussed insurance authorization process. No further questions reported at this time. RNCM to continue to follow and assist with discharge planning needs.   Alicia [name](daughter) [phone], Alicia [name] (daughter) [phone]  Expected Discharge Plan: Skilled Nursing Facility Barriers to Discharge: Continued Medical Work up   Patient Goals and CMS Choice     Choice offered to / list presented to : Adult Children      Expected Discharge Plan and Services   Discharge Planning Services: CM Consult   Living arrangements for the past 2 months:  [facility])                                      Prior Living Arrangements/Services Living arrangements for the past 2 months:  ([facility],Memory Care Unit) Lives with:: Other (Comment) ([facility])                   Activities of Daily Living   ADL Screening (condition at time of admission) Independently performs ADLs?: No Does the patient have a NEW difficulty  with bathing/dressing/toileting/self-feeding that is expected to last >3 days?: Yes (Initiates electronic notice to provider for possible OT consult) Does the patient have a NEW difficulty with getting in/out of bed, walking, or climbing stairs that is expected to last >3 days?: Yes (Initiates electronic notice to provider for possible PT consult) Does the patient have a NEW difficulty with communication that is expected to last >3 days?: No Is the patient deaf or have difficulty hearing?: No Does the patient have difficulty seeing, even when wearing glasses/contacts?: No Does the patient have difficulty concentrating, remembering, or making decisions?: Yes  Permission Sought/Granted                  Emotional Assessment              Admission diagnosis:  Fracture of femoral neck, left (HCC) [S72.002A] Closed fracture of left hip, initial encounter (HCC) [S72.002A] Closed fracture of distal end of left radius, unspecified fracture morphology, initial encounter [S52.502A] Patient Active Problem List   Diagnosis Date Noted   Closed fracture of left distal radius 03/19/2023   Dementia without behavioral disturbance (HCC) 03/18/2023   Anxiety and depression 03/18/2023   Hyponatremia 03/18/2023   Hypokalemia 03/18/2023   Left wrist fracture 03/18/2023   Closed fracture of left hip (HCC) 03/18/2023   Leukocytosis 03/18/2023   Senile purpura (HCC) 10/25/2019   Essential hypertension 09/09/2015   Seizure disorder (HCC) 07/25/2011  Osteoporosis 07/25/2011   Vitamin D deficiency 07/25/2011   PCP:  No primary care provider on file. Pharmacy:   [id number] - [place], [place] - [address] AT [address] OF [address] [place] [address] Phone: [phone] Fax: [phone]     Social Determinants of Health (SDOH) Social History: SDOH Screenings   Food Insecurity: No Food Insecurity (03/19/2023)  Housing: Low Risk  (03/19/2023)   Transportation Needs: No Transportation Needs (03/19/2023)  Utilities: Not At Risk (03/19/2023)  Depression (PHQ2-9): Low Risk  (11/16/2020)  Financial Resource Strain: Low Risk  (07/08/2021)  Physical Activity: Insufficiently Active (07/08/2021)  Social Connections: Unknown (07/08/2021)  Stress: No Stress Concern Present (07/08/2021)  Tobacco Use: Low Risk  (03/19/2023)   SDOH Interventions:     Readmission Risk Interventions     No data to display

## 2023-03-21 NOTE — [place] FL2 (Signed)
[place] MEDICAID FL2 LEVEL OF CARE FORM     IDENTIFICATION  Patient Name: Alicia [name] Birthdate: 12-30-1933 Sex: female Admission Date (Current Location): 03/18/2023  [facility] and [place] Number:  Producer, television/film/video and Address:  [facility] [place], [address] [place], [place] [address]      Provider Number: [id number]  Attending Physician Name and Address:  Lonia Blood, MD  Relative Name and Phone Number:       Current Level of Care: Hospital Recommended Level of Care: Skilled Nursing Facility Prior Approval Number:    Date Approved/Denied:   PASRR Number: [id number]  Discharge Plan: SNF    Current Diagnoses: Patient Active Problem List   Diagnosis Date Noted   Closed fracture of left distal radius 03/19/2023   Dementia without behavioral disturbance (HCC) 03/18/2023   Anxiety and depression 03/18/2023   Hyponatremia 03/18/2023   Hypokalemia 03/18/2023   Left wrist fracture 03/18/2023   Closed fracture of left hip (HCC) 03/18/2023   Leukocytosis 03/18/2023   Senile purpura (HCC) 10/25/2019   Essential hypertension 09/09/2015   Seizure disorder (HCC) 07/25/2011   Osteoporosis 07/25/2011   Vitamin D deficiency 07/25/2011    Orientation RESPIRATION BLADDER Height & Weight     Self  O2 ([place] 4L) Incontinent Weight: 117 lb 8.1 oz (53.3 kg) Height:  5' 0.98" (154.9 cm)  BEHAVIORAL SYMPTOMS/MOOD NEUROLOGICAL BOWEL NUTRITION STATUS      Continent Diet (regular)  AMBULATORY STATUS COMMUNICATION OF NEEDS Skin   Extensive Assist Verbally Surgical wounds (closed left hip: hydrocolloid, closed left arm: hard cast)                       Personal Care Assistance Level of Assistance  Bathing, Feeding, Dressing Bathing Assistance: Maximum assistance Feeding assistance: Limited assistance Dressing Assistance: Maximum assistance     Functional Limitations Info             SPECIAL CARE FACTORS FREQUENCY  PT (By licensed  PT), OT (By licensed OT)     PT Frequency: 5x/wk OT Frequency: 5x/wk            Contractures Contractures Info: Not present    Additional Factors Info  Code Status, Allergies Code Status Info: Full Allergies Info: Sulfa antibiotics           Current Medications (03/21/2023):  This is the current hospital active medication list Current Facility-Administered Medications  Medication Dose Route Frequency Provider Last Rate Last Admin   acetaminophen (TYLENOL) tablet 325-650 mg  325-650 mg Oral Q6H PRN Huel Cote, MD   650 mg at 03/20/23 1313   aspirin EC tablet 325 mg  325 mg Oral Q breakfast Huel Cote, MD   325 mg at 03/21/23 1122   cephALEXin (KEFLEX) capsule 500 mg  500 mg Oral Q12H Lonia Blood, MD   500 mg at 03/21/23 1121   cholecalciferol (VITAMIN D3) 25 MCG (1000 UNIT) tablet 1,000 Units  1,000 Units Oral Daily Huel Cote, MD   1,000 Units at 03/21/23 1121   docusate sodium (COLACE) capsule 100 mg  100 mg Oral BID Huel Cote, MD   100 mg at 03/21/23 1121   feeding supplement (ENSURE ENLIVE / ENSURE PLUS) liquid 237 mL  237 mL Oral BID BM Lonia Blood, MD   237 mL at 03/21/23 1120   HYDROcodone-acetaminophen (NORCO) 7.5-325 MG per tablet 1-2 tablet  1-2 tablet Oral Q4H PRN Huel Cote, MD  2 tablet at 03/21/23 0546   HYDROcodone-acetaminophen (NORCO/VICODIN) 5-325 MG per tablet 1-2 tablet  1-2 tablet Oral Q4H PRN Huel Cote, MD   1 tablet at 03/20/23 0957   levETIRAcetam (KEPPRA) tablet 500 mg  500 mg Oral BID Huel Cote, MD   500 mg at 03/21/23 1122   methocarbamol (ROBAXIN) tablet 500 mg  500 mg Oral Q6H PRN Huel Cote, MD   500 mg at 03/21/23 0547   Or   methocarbamol (ROBAXIN) injection 500 mg  500 mg Intravenous Q6H PRN Huel Cote, MD       mirabegron ER [facility]) tablet 25 mg  25 mg Oral Daily Huel Cote, MD   25 mg at 03/21/23 1121   morphine (PF) 2 MG/ML injection 0.5-1 mg  0.5-1 mg Intravenous Q2H PRN  Huel Cote, MD   1 mg at 03/21/23 0401   multivitamin with minerals tablet 1 tablet  1 tablet Oral Daily Lonia Blood, MD   1 tablet at 03/21/23 1122   ondansetron (ZOFRAN) tablet 4 mg  4 mg Oral Q6H PRN Huel Cote, MD       Or   ondansetron [facility]) injection 4 mg  4 mg Intravenous Q6H PRN Huel Cote, MD       senna-docusate (Senokot-S) tablet 1 tablet  1 tablet Oral QHS PRN Huel Cote, MD       sertraline (ZOLOFT) tablet 50 mg  50 mg Oral Daily Huel Cote, MD   50 mg at 03/21/23 1121     Discharge Medications: Please see discharge summary for a list of discharge medications.  Relevant Imaging Results:  Relevant Lab Results:   Additional Information SS#: [id number]  Baldemar Lenis, LCSW

## 2023-03-22 DIAGNOSIS — F03B3 Unspecified dementia, moderate, with mood disturbance: Secondary | ICD-10-CM | POA: Diagnosis not present

## 2023-03-22 DIAGNOSIS — S72002D Fracture of unspecified part of neck of left femur, subsequent encounter for closed fracture with routine healing: Secondary | ICD-10-CM | POA: Diagnosis not present

## 2023-03-22 DIAGNOSIS — M6281 Muscle weakness (generalized): Secondary | ICD-10-CM | POA: Diagnosis not present

## 2023-03-22 DIAGNOSIS — N182 Chronic kidney disease, stage 2 (mild): Secondary | ICD-10-CM | POA: Diagnosis not present

## 2023-03-22 DIAGNOSIS — F03918 Unspecified dementia, unspecified severity, with other behavioral disturbance: Secondary | ICD-10-CM | POA: Diagnosis not present

## 2023-03-22 DIAGNOSIS — S82892D Other fracture of left lower leg, subsequent encounter for closed fracture with routine healing: Secondary | ICD-10-CM | POA: Diagnosis not present

## 2023-03-22 DIAGNOSIS — I130 Hypertensive heart and chronic kidney disease with heart failure and stage 1 through stage 4 chronic kidney disease, or unspecified chronic kidney disease: Secondary | ICD-10-CM | POA: Diagnosis not present

## 2023-03-22 DIAGNOSIS — R41841 Cognitive communication deficit: Secondary | ICD-10-CM | POA: Diagnosis not present

## 2023-03-22 DIAGNOSIS — S52502A Unspecified fracture of the lower end of left radius, initial encounter for closed fracture: Secondary | ICD-10-CM | POA: Diagnosis not present

## 2023-03-22 DIAGNOSIS — G40909 Epilepsy, unspecified, not intractable, without status epilepticus: Secondary | ICD-10-CM | POA: Diagnosis not present

## 2023-03-22 DIAGNOSIS — N390 Urinary tract infection, site not specified: Secondary | ICD-10-CM | POA: Diagnosis not present

## 2023-03-22 DIAGNOSIS — F419 Anxiety disorder, unspecified: Secondary | ICD-10-CM | POA: Diagnosis not present

## 2023-03-22 DIAGNOSIS — S72042A Displaced fracture of base of neck of left femur, initial encounter for closed fracture: Secondary | ICD-10-CM | POA: Diagnosis not present

## 2023-03-22 DIAGNOSIS — R1312 Dysphagia, oropharyngeal phase: Secondary | ICD-10-CM | POA: Diagnosis not present

## 2023-03-22 DIAGNOSIS — F32A Depression, unspecified: Secondary | ICD-10-CM | POA: Diagnosis not present

## 2023-03-22 DIAGNOSIS — R410 Disorientation, unspecified: Secondary | ICD-10-CM | POA: Diagnosis not present

## 2023-03-22 DIAGNOSIS — Z7401 Bed confinement status: Secondary | ICD-10-CM | POA: Diagnosis not present

## 2023-03-22 DIAGNOSIS — S72042D Displaced fracture of base of neck of left femur, subsequent encounter for closed fracture with routine healing: Secondary | ICD-10-CM | POA: Diagnosis not present

## 2023-03-22 DIAGNOSIS — R2689 Other abnormalities of gait and mobility: Secondary | ICD-10-CM | POA: Diagnosis not present

## 2023-03-22 DIAGNOSIS — E871 Hypo-osmolality and hyponatremia: Secondary | ICD-10-CM | POA: Diagnosis not present

## 2023-03-22 DIAGNOSIS — J952 Acute pulmonary insufficiency following nonthoracic surgery: Secondary | ICD-10-CM | POA: Diagnosis not present

## 2023-03-22 DIAGNOSIS — T8484XD Pain due to internal orthopedic prosthetic devices, implants and grafts, subsequent encounter: Secondary | ICD-10-CM | POA: Diagnosis not present

## 2023-03-22 DIAGNOSIS — Z4789 Encounter for other orthopedic aftercare: Secondary | ICD-10-CM | POA: Diagnosis not present

## 2023-03-22 DIAGNOSIS — G894 Chronic pain syndrome: Secondary | ICD-10-CM | POA: Diagnosis not present

## 2023-03-22 DIAGNOSIS — Z7901 Long term (current) use of anticoagulants: Secondary | ICD-10-CM | POA: Diagnosis not present

## 2023-03-22 DIAGNOSIS — S52502D Unspecified fracture of the lower end of left radius, subsequent encounter for closed fracture with routine healing: Secondary | ICD-10-CM | POA: Diagnosis not present

## 2023-03-22 MED ORDER — ASPIRIN 325 MG PO TBEC: 325.0000 mg | DELAYED_RELEASE_TABLET | Freq: Every day | ORAL | Status: AC

## 2023-03-22 MED ORDER — SENNOSIDES-DOCUSATE SODIUM 8.6-50 MG PO TABS: 1.0000 | ORAL_TABLET | Freq: Two times a day (BID) | ORAL | 0 refills | Status: AC

## 2023-03-22 MED ORDER — CEPHALEXIN 500 MG PO CAPS: 500.0000 mg | ORAL_CAPSULE | Freq: Two times a day (BID) | ORAL | 0 refills | Status: AC

## 2023-03-22 MED ORDER — POLYETHYLENE GLYCOL 3350 17 G PO PACK: 17.0000 g | PACK | Freq: Every day | ORAL | 0 refills | Status: AC | PRN

## 2023-03-22 MED ORDER — QUETIAPINE FUMARATE 25 MG PO TABS: 25.0000 mg | ORAL_TABLET | Freq: Every day | ORAL | 0 refills | Status: AC

## 2023-03-22 MED ORDER — METHOCARBAMOL 500 MG PO TABS: 500.0000 mg | ORAL_TABLET | Freq: Four times a day (QID) | ORAL | 0 refills | Status: AC | PRN

## 2023-03-22 NOTE — TOC Transition Note (Signed)
Transition of Care [facility]) - CM/SW Discharge Note   Patient Details  Name: Alicia [name] MRN: [id number] Date of Birth: 23-Oct-1933  Transition of Care [facility]) CM/SW Contact:  Lorri Frederick, LCSW Phone Number: 03/22/2023, 10:30 AM   Clinical Narrative:  Pt discharging to [facility], room 1204.  RN call report to [phone].     Final next level of care: Skilled Nursing Facility Barriers to Discharge: Barriers Resolved   Patient Goals and CMS Choice   Choice offered to / list presented to : Adult Children  Discharge Placement                Patient [facility] bed at: [facility] Patient to be transferred to facility by: [facility] Name of family member notified: daughter Rosey Bath in room Patient and family notified of of transfer: 03/22/23  Discharge Plan and Services Additional resources added to the After Visit Summary for     Discharge Planning Services: CM Consult                                 Social Determinants of Health (SDOH) Interventions SDOH Screenings   Food Insecurity: No Food Insecurity (03/19/2023)  Housing: Low Risk  (03/19/2023)  Transportation Needs: No Transportation Needs (03/19/2023)  Utilities: Not At Risk (03/19/2023)  Depression (PHQ2-9): Low Risk  (11/16/2020)  Financial Resource Strain: Low Risk  (07/08/2021)  Physical Activity: Insufficiently Active (07/08/2021)  Social Connections: Unknown (07/08/2021)  Stress: No Stress Concern Present (07/08/2021)  Tobacco Use: Low Risk  (03/19/2023)     Readmission Risk Interventions     No data to display

## 2023-03-22 NOTE — Discharge Summary (Signed)
Physician Discharge Summary  AVAH SMETHURST HKV:[id number] DOB: 03/26/1934 DOA: 03/18/2023  PCP: No primary care provider on file.  Admit date: 03/18/2023 Discharge date: 03/22/2023  Admitted From: Memory care unit Disposition: SNF  Recommendations for Outpatient Follow-up:  Follow up with SNF provider at earliest convenience  outpatient follow-up with orthopedics. discharge pain management/DVT prophylaxis/wound care as per orthopedics recommendations Recommend outpatient evaluation and follow-up with palliative care for goals of care discussion Follow up in ED if symptoms worsen or new appear   Home Health: No Equipment/Devices: None  Discharge Condition: Guarded CODE STATUS: Full Diet recommendation: Heart healthy  Brief/Interim Summary: 89 year old SNF resident with a history of peptic ulcer disease, seizures, RLS, HTN, HLD, dementia, and depression/anxiety presents with after a fall at her facility and was found to have acute transverse fracture through the distal radius and a subcapital left femoral neck fracture.  Patient underwent surgical intervention of femoral fracture and closed reduction and casting of left distal radial fracture on 03/19/2023 by orthopedics.  Subsequently PT recommended SNF placement.  Currently medically stable for discharge to SNF.  Discharge to SNF once bed is available  Discharge Diagnoses:   Left femoral neck fracture -underwent surgical intervention of femoral fracture on 03/19/2023 by orthopedics.  Pain management/DVT prophylaxis/wound care as per orthopedics recommendations.  Outpatient follow-up with orthopedics. - PT recommended SNF placement.  Currently medically stable for discharge to SNF. -Discharge to SNF once bed is available   Left distal radial transverse fracture -Underwent closed reduction and casting in the OR 03/19/23   Probable UTI: Present on admission -No urine culture available.  Continue 3 day course of antibiotics    Leukocytosis -no labs today.   Chronic hyponatremia -Improving.  No labs today.  Off chlorthalidone.  Outpatient follow-up.   Seizure disorder -Currently stable.  Continue Keppra   Dementia Goals of care -Fall precautions.  Delirium precautions. -Palliative care consultation for goals of care discussion.  This can happen as an outpatient -Patient was started on Seroquel during this hospitalization which will be continued till reevaluation by SNF provider as an outpatient   Anxiety/depression--continue Zoloft   Vitamin D deficiency -continue supplementation  Discharge Instructions  Discharge Instructions     Amb Referral to Osteoporosis Management    Complete by: As directed    Amb Referral to Palliative Care   Complete by: As directed    Diet - low sodium heart healthy   Complete by: As directed    Increase activity slowly   Complete by: As directed       Allergies as of 03/22/2023       Reactions   Sulfa Antibiotics Rash        Medication List     STOP taking these medications    amLODipine 10 MG tablet Commonly known as: NORVASC   chlorthalidone 25 MG tablet Commonly known as: HYGROTON       TAKE these medications    aspirin EC 325 MG tablet Take 1 tablet (325 mg total) by mouth daily with breakfast.   cephALEXin 500 MG capsule Commonly known as: KEFLEX Take 1 capsule (500 mg total) by mouth every 12 (twelve) hours for 2 days.   levETIRAcetam 500 MG tablet Commonly known as: KEPPRA TAKE 1 TABLET BY MOUTH TWICE A DAY *NDC [id number]*   methocarbamol 500 MG tablet Commonly known as: ROBAXIN Take 1 tablet (500 mg total) by mouth every 6 (six) hours as needed for muscle spasms.   Myrbetriq 25 MG Tb24 tablet  Generic drug: mirabegron ER Take 25 mg by mouth daily.   polyethylene glycol 17 g packet Commonly known as: MiraLax Take 17 g by mouth daily as needed for mild constipation.   QUEtiapine 25 MG tablet Commonly known as:  SEROQUEL Take 1 tablet (25 mg total) by mouth at bedtime.   senna-docusate 8.6-50 MG tablet Commonly known as: Senokot-S Take 1 tablet by mouth 2 (two) times daily.   sertraline 50 MG tablet Commonly known as: ZOLOFT Take 50 mg by mouth daily.   Vitamin D (Cholecalciferol) 25 MCG (1000 UT) Caps Take 1 capsule by mouth daily.        Follow-up Information     Huel Cote, MD Follow up.   Specialty: Orthopedic Surgery Contact information: [address] [place] [address] [phone]                Allergies  Allergen Reactions   Sulfa Antibiotics Rash    Consultations: Orthopedics   Procedures/Studies: DG HIP UNILAT WITH PELVIS 2-3 VIEWS LEFT  Result Date: 03/19/2023 CLINICAL DATA:  Elective surgery. EXAM: DG HIP (WITH OR WITHOUT PELVIS) 2-3V LEFT COMPARISON:  Preoperative imaging. FINDINGS: Six fluoroscopic spot views of the left hip obtained in the operating room. Sequential images during pinning of femoral neck fracture. Fluoroscopy time 1:09 0.5 seconds. Dose 15.24 mGy. IMPRESSION: Intraoperative fluoroscopy during pinning of left femoral neck fracture. Electronically Signed   By: Narda Rutherford M.D.   On: 03/19/2023 10:52   DG Wrist Complete Left  Result Date: 03/19/2023 CLINICAL DATA:  Elective surgery.  Closed reduction of left wrist. EXAM: LEFT WRIST - COMPLETE 3+ VIEW COMPARISON:  Radiograph yesterday FINDINGS: Two fluoroscopic spot views of the left wrist obtained in the operating room. Distal radius fracture is in improved alignment, overlying cast material in place. Fluoroscopy time 6.5 seconds. Dose 0.2 mGy. IMPRESSION: Intraoperative fluoroscopy during distal radius fracture reduction. Electronically Signed   By: Narda Rutherford M.D.   On: 03/19/2023 10:51   DG C-Arm 1-60 Min-No Report  Result Date: 03/19/2023 Fluoroscopy was utilized by the requesting physician.  No radiographic interpretation.   DG C-Arm 1-60 Min-No  Report  Result Date: 03/19/2023 Fluoroscopy was utilized by the requesting physician.  No radiographic interpretation.   DG C-Arm 1-60 Min-No Report  Result Date: 03/19/2023 Fluoroscopy was utilized by the requesting physician.  No radiographic interpretation.   DG HIP UNILAT WITH PELVIS 1V LEFT  Result Date: 03/19/2023 CLINICAL DATA:  Preop exam.  Left hip fracture. EXAM: DG HIP (WITH OR WITHOUT PELVIS) 1V*L* COMPARISON:  03/18/2023 FINDINGS: Again seen is an impacted, subcapital left femoral neck fracture. No dislocation identified. Joint spaces are maintained. IMPRESSION: Impacted, subcapital left femoral neck fracture as before. Electronically Signed   By: Signa Kell M.D.   On: 03/19/2023 08:07   DG Hip Unilat W or Wo Pelvis 2-3 Views Left  Result Date: 03/18/2023 CLINICAL DATA:  Fall EXAM: DG HIP (WITH OR WITHOUT PELVIS) 2-3V LEFT COMPARISON:  None. FINDINGS: The bones are osteopenic. There is subcapital left femoral neck fracture, mildly impacted. There is no dislocation. Joint spaces are maintained. Soft tissues are within normal limits. IMPRESSION: Subcapital left femoral neck fracture, mildly impacted. Electronically Signed   By: [name] M.D.   On: 03/18/2023 18:19   DG Knee Complete 4 Views Left  Result Date: 03/18/2023 CLINICAL DATA:  Fall EXAM: LEFT KNEE - COMPLETE 4+ VIEW COMPARISON:  None Available. FINDINGS: The bones are osteopenic. There is no acute  fracture, dislocation or joint effusion. There is mild lateral compartment degenerative change with chondral calcification. IMPRESSION: 1. No acute fracture or dislocation. 2. Mild lateral compartment degenerative change. Electronically Signed   By: [name] M.D.   On: 03/18/2023 18:17   DG Wrist Complete Left  Result Date: 03/18/2023 CLINICAL DATA:  Fall EXAM: LEFT WRIST - COMPLETE 3+ VIEW COMPARISON:  None Available. FINDINGS: The bones are diffusely osteopenic. There is an acute transverse fracture through  the distal radius, mildly impacted. There is no dislocation. There is soft tissue swelling surrounding the wrist. There is radiocarpal joint space narrowing and calcification of the triangular fibrocartilage compatible with degenerative change. IMPRESSION: Acute transverse fracture through the distal radius, mildly impacted. Electronically Signed   By: [name] M.D.   On: 03/18/2023 18:16      Subjective: Patient seen and examined at bedside. Wakes up slightly, hardly answers any questions. No fever, seizures, agitation reported.   Discharge Exam: Vitals:   03/22/23 0554 03/22/23 0821  BP: 133/76 133/79  Pulse: 88 82  Resp: 16 18  Temp: 98.3 F (36.8 C) 98.5 F (36.9 C)  SpO2: 95% 94%    General exam: Appears calm and comfortable.  On room air.  Elderly female lying in bed.  Looks up soundly slightly, hardly answers any questions.  Flat affect. Respiratory system: Bilateral decreased breath sounds at bases with scattered crackles Cardiovascular system: S1 & S2 heard, Rate controlled Gastrointestinal system: Abdomen is nondistended, soft and nontender. Normal bowel sounds heard. Extremities: No cyanosis, clubbing, edema     The results of significant diagnostics from this hospitalization (including imaging, microbiology, ancillary and laboratory) are listed below for reference.     Microbiology: Recent Results (from the past 240 hour(s))  MRSA Next Gen by PCR, Nasal     Status: None   Collection Time: 03/19/23  4:44 AM   Specimen: Nasal Mucosa; Nasal Swab  Result Value Ref Range Status   MRSA by PCR Next Gen NOT DETECTED NOT DETECTED Final    Comment: (NOTE) The GeneXpert MRSA Assay (FDA approved for NASAL specimens only), is one component of a comprehensive MRSA colonization surveillance program. It is not intended to diagnose MRSA infection nor to guide or monitor treatment for MRSA infections. Test performance is not FDA approved in patients less than 65  years old. Performed at [facility], [address], [place], [place] [address]      Labs: BNP (last 3 results) No results for input(s): "BNP" in the last 8760 hours. Basic Metabolic Panel: Recent Labs  Lab 03/18/23 1931 03/18/23 2145 03/19/23 0603 03/20/23 0642 03/21/23 0601  NA 127*  --  127* 128* 132*  K 3.2*  --  3.7 4.1 3.6  CL 94*  --  95* 93* 99  CO2 22  --  23 26 26   GLUCOSE 128*  --  140* 127* 110*  BUN 12  --  6* 20 12  CREATININE 0.65  --  0.55 0.67 0.55  CALCIUM 9.0  --  8.7* 8.8* 8.3*  MG  --  1.8  --   --   --    Liver Function Tests: No results for input(s): "AST", "ALT", "ALKPHOS", "BILITOT", "PROT", "ALBUMIN" in the last 168 hours. No results for input(s): "LIPASE", "AMYLASE" in the last 168 hours. No results for input(s): "AMMONIA" in the last 168 hours. CBC: Recent Labs  Lab 03/18/23 1931 03/19/23 0603 03/20/23 0642  WBC 14.3* 11.0* 13.8*  NEUTROABS 12.0*  --   --  HGB 13.3 12.4 11.3*  HCT 39.5 36.6 33.4*  MCV 85.9 84.3 85.4  PLT 237 216 184   Cardiac Enzymes: No results for input(s): "CKTOTAL", "CKMB", "CKMBINDEX", "TROPONINI" in the last 168 hours. BNP: Invalid input(s): "POCBNP" CBG: No results for input(s): "GLUCAP" in the last 168 hours. D-Dimer No results for input(s): "DDIMER" in the last 72 hours. Hgb A1c No results for input(s): "HGBA1C" in the last 72 hours. Lipid Profile No results for input(s): "CHOL", "HDL", "LDLCALC", "TRIG", "CHOLHDL", "LDLDIRECT" in the last 72 hours. Thyroid function studies Recent Labs    03/20/23 0642  TSH 0.698   Anemia work up No results for input(s): "VITAMINB12", "FOLATE", "FERRITIN", "TIBC", "IRON", "RETICCTPCT" in the last 72 hours. Urinalysis    Component Value Date/Time   COLORURINE YELLOW 03/19/2023 1256   APPEARANCEUR CLOUDY (A) 03/19/2023 1256   LABSPEC 1.015 03/19/2023 1256   LABSPEC 1.025 12/17/2020 1309   PHURINE 7.0 03/19/2023 1256   GLUCOSEU NEGATIVE 03/19/2023 1256    HGBUR SMALL (A) 03/19/2023 1256   BILIRUBINUR NEGATIVE 03/19/2023 1256   BILIRUBINUR negative 12/17/2020 1309   BILIRUBINUR neg 08/06/2012 0955   KETONESUR NEGATIVE 03/19/2023 1256   PROTEINUR NEGATIVE 03/19/2023 1256   UROBILINOGEN negative 08/06/2012 0955   NITRITE POSITIVE (A) 03/19/2023 1256   LEUKOCYTESUR SMALL (A) 03/19/2023 1256   Sepsis Labs Recent Labs  Lab 03/18/23 1931 03/19/23 0603 03/20/23 0642  WBC 14.3* 11.0* 13.8*   Microbiology Recent Results (from the past 240 hour(s))  MRSA Next Gen by PCR, Nasal     Status: None   Collection Time: 03/19/23  4:44 AM   Specimen: Nasal Mucosa; Nasal Swab  Result Value Ref Range Status   MRSA by PCR Next Gen NOT DETECTED NOT DETECTED Final    Comment: (NOTE) The GeneXpert MRSA Assay (FDA approved for NASAL specimens only), is one component of a comprehensive MRSA colonization surveillance program. It is not intended to diagnose MRSA infection nor to guide or monitor treatment for MRSA infections. Test performance is not FDA approved in patients less than 70 years old. Performed at [facility], [address], [place], [place] [address]      Time coordinating discharge: 35 minutes  SIGNED:   Glade Lloyd, MD  [facility] 03/22/2023, 10:04 AM

## 2023-03-22 NOTE — TOC Progression Note (Signed)
Transition of Care [facility]) - Progression Note    Patient Details  Name: Alicia [name] MRN: [id number] Date of Birth: April 17, 1934  Transition of Care [facility]) CM/SW Contact  Lorri Frederick, LCSW Phone Number: 03/22/2023, 8:55 AM  Clinical Narrative:   Medicare payer with inpt order on 11/23.      Expected Discharge Plan: Skilled Nursing Facility Barriers to Discharge: Continued Medical Work up  Expected Discharge Plan and Services   Discharge Planning Services: CM Consult   Living arrangements for the past 2 months:  ([facility] Unit)                                       Social Determinants of Health (SDOH) Interventions SDOH Screenings   Food Insecurity: No Food Insecurity (03/19/2023)  Housing: Low Risk  (03/19/2023)  Transportation Needs: No Transportation Needs (03/19/2023)  Utilities: Not At Risk (03/19/2023)  Depression (PHQ2-9): Low Risk  (11/16/2020)  Financial Resource Strain: Low Risk  (07/08/2021)  Physical Activity: Insufficiently Active (07/08/2021)  Social Connections: Unknown (07/08/2021)  Stress: No Stress Concern Present (07/08/2021)  Tobacco Use: Low Risk  (03/19/2023)    Readmission Risk Interventions     No data to display

## 2023-03-22 NOTE — Progress Notes (Signed)
PROGRESS NOTE    NUBIAN GAUL  WUJ:[id number] DOB: Dec 08, 1933 DOA: 03/18/2023 PCP: No primary care provider on file.   Brief Narrative:  89 year old SNF resident with a history of peptic ulcer disease, seizures, RLS, HTN, HLD, dementia, and depression/anxiety presents with after a fall at her facility and was found to have acute transverse fracture through the distal radius and a subcapital left femoral neck fracture.  Patient underwent surgical intervention of femoral fracture and closed reduction and casting of left distal radial fracture on 03/19/2023 by orthopedics.  Subsequently PT recommended SNF placement.  Currently medically stable for discharge to SNF.  Assessment & Plan:   Left femoral neck fracture -underwent surgical intervention of femoral fracture on 03/19/2023 by orthopedics.  Pain management/DVT prophylaxis/wound care as per orthopedics recommendations.  Outpatient follow-up with orthopedics. - PT recommended SNF placement.  Currently medically stable for discharge to SNF. -TOC following  Left distal radial transverse fracture -Underwent closed reduction and casting in the OR 11/24  Probable UTI: Present on admission -No urine culture available.  Continue treated course of antibiotics  Leukocytosis -no labs today.  Chronic hyponatremia -Improving.  No labs today.  Off chlorthalidone  Seizure disorder -Currently stable.  Continue Keppra  Dementia Goals of care -Fall precautions.  Delirium precautions. -Palliative care consultation for goals of care discussion  Anxiety/depression--continue Zoloft  Vitamin D deficiency -continue supplementation   DVT prophylaxis: Aspirin per orthopedics  code Status: Full Family Communication: None at bedside Disposition Plan: Status is: Inpatient Remains inpatient appropriate because: Of severity of illness.  Need for SNF placement.    Consultants: Orthopedics.  Consult palliative care  Procedures: As  above  Antimicrobials:  Anti-infectives (From admission, onward)    Start     Dose/Rate Route Frequency Ordered Stop   03/21/23 1000  cephALEXin (KEFLEX) capsule 500 mg        500 mg Oral Every 12 hours 03/21/23 0912 03/24/23 0959   03/19/23 0645  ceFAZolin (ANCEF) IVPB 2g/100 mL premix        2 g 200 mL/hr over 30 Minutes Intravenous On call to O.R. 03/19/23 0558 03/19/23 0757   03/19/23 0645  ceFAZolin (ANCEF) 1-4 GM/50ML-% IVPB       Note to Pharmacy: Crissie Sickles: cabinet override      03/19/23 0645 03/19/23 1859        Subjective: Patient seen and examined at bedside.  Wakes up slightly, hardly answers any questions.  No fever, seizures, agitation reported.  Objective: Vitals:   03/21/23 0913 03/21/23 2015 03/22/23 0554 03/22/23 0821  BP: 116/66 (!) 116/91 133/76 133/79  Pulse: 83 78 88 82  Resp: 18 17 16 18   Temp: 98 F (36.7 C) 98.3 F (36.8 C) 98.3 F (36.8 C) 98.5 F (36.9 C)  TempSrc: Oral Oral Oral Oral  SpO2: 92% 95% 95% 94%  Weight:      Height:        Intake/Output Summary (Last 24 hours) at 03/22/2023 0937 Last data filed at 03/21/2023 2057 Gross per 24 hour  Intake 237 ml  Output 450 ml  Net -213 ml   Filed Weights   03/18/23 1653 03/19/23 0228 03/19/23 0655  Weight: 49 kg 53.3 kg 53.3 kg    Examination:  General exam: Appears calm and comfortable.  On room air.  Elderly female lying in bed.  Looks up soundly slightly, hardly answers any questions.  Flat affect. Respiratory system: Bilateral decreased breath sounds at bases with scattered crackles Cardiovascular system:  S1 & S2 heard, Rate controlled Gastrointestinal system: Abdomen is nondistended, soft and nontender. Normal bowel sounds heard. Extremities: No cyanosis, clubbing, edema   Data Reviewed: I have personally reviewed following labs and imaging studies  CBC: Recent Labs  Lab 03/18/23 1931 03/19/23 0603 03/20/23 0642  WBC 14.3* 11.0* 13.8*  NEUTROABS 12.0*  --   --    HGB 13.3 12.4 11.3*  HCT 39.5 36.6 33.4*  MCV 85.9 84.3 85.4  PLT 237 216 184   Basic Metabolic Panel: Recent Labs  Lab 03/18/23 1931 03/18/23 2145 03/19/23 0603 03/20/23 0642 03/21/23 0601  NA 127*  --  127* 128* 132*  K 3.2*  --  3.7 4.1 3.6  CL 94*  --  95* 93* 99  CO2 22  --  23 26 26   GLUCOSE 128*  --  140* 127* 110*  BUN 12  --  6* 20 12  CREATININE 0.65  --  0.55 0.67 0.55  CALCIUM 9.0  --  8.7* 8.8* 8.3*  MG  --  1.8  --   --   --    GFR: Estimated Creatinine Clearance: 37.4 mL/min (by C-G formula based on SCr of 0.55 mg/dL). Liver Function Tests: No results for input(s): "AST", "ALT", "ALKPHOS", "BILITOT", "PROT", "ALBUMIN" in the last 168 hours. No results for input(s): "LIPASE", "AMYLASE" in the last 168 hours. No results for input(s): "AMMONIA" in the last 168 hours. Coagulation Profile: Recent Labs  Lab 03/18/23 1931  INR 1.1   Cardiac Enzymes: No results for input(s): "CKTOTAL", "CKMB", "CKMBINDEX", "TROPONINI" in the last 168 hours. BNP (last 3 results) No results for input(s): "PROBNP" in the last 8760 hours. HbA1C: No results for input(s): "HGBA1C" in the last 72 hours. CBG: No results for input(s): "GLUCAP" in the last 168 hours. Lipid Profile: No results for input(s): "CHOL", "HDL", "LDLCALC", "TRIG", "CHOLHDL", "LDLDIRECT" in the last 72 hours. Thyroid Function Tests: Recent Labs    03/20/23 0642  TSH 0.698   Anemia Panel: No results for input(s): "VITAMINB12", "FOLATE", "FERRITIN", "TIBC", "IRON", "RETICCTPCT" in the last 72 hours. Sepsis Labs: No results for input(s): "PROCALCITON", "LATICACIDVEN" in the last 168 hours.  Recent Results (from the past 240 hour(s))  MRSA Next Gen by PCR, Nasal     Status: None   Collection Time: 03/19/23  4:44 AM   Specimen: Nasal Mucosa; Nasal Swab  Result Value Ref Range Status   MRSA by PCR Next Gen NOT DETECTED NOT DETECTED Final    Comment: (NOTE) The GeneXpert MRSA Assay (FDA approved for  NASAL specimens only), is one component of a comprehensive MRSA colonization surveillance program. It is not intended to diagnose MRSA infection nor to guide or monitor treatment for MRSA infections. Test performance is not FDA approved in patients less than 22 years old. Performed at [facility], [address], [place], [place] [address]          Radiology Studies: No results found.      Scheduled Meds:  aspirin EC  325 mg Oral Q breakfast   cephALEXin  500 mg Oral Q12H   cholecalciferol  1,000 Units Oral Daily   docusate sodium  100 mg Oral BID   feeding supplement  237 mL Oral BID BM   levETIRAcetam  500 mg Oral BID   mirabegron ER  25 mg Oral Daily   multivitamin with minerals  1 tablet Oral Daily   QUEtiapine  25 mg Oral QHS   sertraline  50 mg  Oral Daily   Continuous Infusions:        Glade Lloyd, MD [facility] 03/22/2023, 9:37 AM '

## 2023-03-27 DIAGNOSIS — Z7901 Long term (current) use of anticoagulants: Secondary | ICD-10-CM | POA: Diagnosis not present

## 2023-03-27 DIAGNOSIS — G40909 Epilepsy, unspecified, not intractable, without status epilepticus: Secondary | ICD-10-CM | POA: Diagnosis not present

## 2023-03-27 DIAGNOSIS — N390 Urinary tract infection, site not specified: Secondary | ICD-10-CM | POA: Diagnosis not present

## 2023-03-27 DIAGNOSIS — F03918 Unspecified dementia, unspecified severity, with other behavioral disturbance: Secondary | ICD-10-CM | POA: Diagnosis not present

## 2023-03-27 DIAGNOSIS — S52502A Unspecified fracture of the lower end of left radius, initial encounter for closed fracture: Secondary | ICD-10-CM | POA: Diagnosis not present

## 2023-03-27 DIAGNOSIS — S72042A Displaced fracture of base of neck of left femur, initial encounter for closed fracture: Secondary | ICD-10-CM | POA: Diagnosis not present

## 2023-03-28 DIAGNOSIS — Z4789 Encounter for other orthopedic aftercare: Secondary | ICD-10-CM | POA: Diagnosis not present

## 2023-03-28 DIAGNOSIS — F419 Anxiety disorder, unspecified: Secondary | ICD-10-CM | POA: Diagnosis not present

## 2023-03-28 DIAGNOSIS — F32A Depression, unspecified: Secondary | ICD-10-CM | POA: Diagnosis not present

## 2023-03-28 DIAGNOSIS — F03B3 Unspecified dementia, moderate, with mood disturbance: Secondary | ICD-10-CM | POA: Diagnosis not present

## 2023-03-28 DIAGNOSIS — S72002D Fracture of unspecified part of neck of left femur, subsequent encounter for closed fracture with routine healing: Secondary | ICD-10-CM | POA: Diagnosis not present

## 2023-03-28 DIAGNOSIS — S52502D Unspecified fracture of the lower end of left radius, subsequent encounter for closed fracture with routine healing: Secondary | ICD-10-CM | POA: Diagnosis not present

## 2023-03-28 DIAGNOSIS — M6281 Muscle weakness (generalized): Secondary | ICD-10-CM | POA: Diagnosis not present

## 2023-03-28 DIAGNOSIS — R2689 Other abnormalities of gait and mobility: Secondary | ICD-10-CM | POA: Diagnosis not present

## 2023-03-29 DIAGNOSIS — G40909 Epilepsy, unspecified, not intractable, without status epilepticus: Secondary | ICD-10-CM | POA: Diagnosis not present

## 2023-03-29 DIAGNOSIS — Z7901 Long term (current) use of anticoagulants: Secondary | ICD-10-CM | POA: Diagnosis not present

## 2023-03-29 DIAGNOSIS — F03918 Unspecified dementia, unspecified severity, with other behavioral disturbance: Secondary | ICD-10-CM | POA: Diagnosis not present

## 2023-03-29 DIAGNOSIS — S52502A Unspecified fracture of the lower end of left radius, initial encounter for closed fracture: Secondary | ICD-10-CM | POA: Diagnosis not present

## 2023-03-29 DIAGNOSIS — N390 Urinary tract infection, site not specified: Secondary | ICD-10-CM | POA: Diagnosis not present

## 2023-03-29 DIAGNOSIS — S72042A Displaced fracture of base of neck of left femur, initial encounter for closed fracture: Secondary | ICD-10-CM | POA: Diagnosis not present

## 2023-03-30 DIAGNOSIS — N390 Urinary tract infection, site not specified: Secondary | ICD-10-CM | POA: Diagnosis not present

## 2023-03-30 DIAGNOSIS — N182 Chronic kidney disease, stage 2 (mild): Secondary | ICD-10-CM | POA: Diagnosis not present

## 2023-03-30 DIAGNOSIS — F03918 Unspecified dementia, unspecified severity, with other behavioral disturbance: Secondary | ICD-10-CM | POA: Diagnosis not present

## 2023-03-30 DIAGNOSIS — G894 Chronic pain syndrome: Secondary | ICD-10-CM | POA: Diagnosis not present

## 2023-03-30 DIAGNOSIS — S52502A Unspecified fracture of the lower end of left radius, initial encounter for closed fracture: Secondary | ICD-10-CM | POA: Diagnosis not present

## 2023-03-30 DIAGNOSIS — Z7901 Long term (current) use of anticoagulants: Secondary | ICD-10-CM | POA: Diagnosis not present

## 2023-03-30 DIAGNOSIS — G40909 Epilepsy, unspecified, not intractable, without status epilepticus: Secondary | ICD-10-CM | POA: Diagnosis not present

## 2023-03-30 DIAGNOSIS — S72042A Displaced fracture of base of neck of left femur, initial encounter for closed fracture: Secondary | ICD-10-CM | POA: Diagnosis not present

## 2023-03-31 DIAGNOSIS — R2689 Other abnormalities of gait and mobility: Secondary | ICD-10-CM | POA: Diagnosis not present

## 2023-03-31 DIAGNOSIS — N390 Urinary tract infection, site not specified: Secondary | ICD-10-CM | POA: Diagnosis not present

## 2023-03-31 DIAGNOSIS — G40909 Epilepsy, unspecified, not intractable, without status epilepticus: Secondary | ICD-10-CM | POA: Diagnosis not present

## 2023-03-31 DIAGNOSIS — M6281 Muscle weakness (generalized): Secondary | ICD-10-CM | POA: Diagnosis not present

## 2023-03-31 DIAGNOSIS — S52502A Unspecified fracture of the lower end of left radius, initial encounter for closed fracture: Secondary | ICD-10-CM | POA: Diagnosis not present

## 2023-03-31 DIAGNOSIS — S72042A Displaced fracture of base of neck of left femur, initial encounter for closed fracture: Secondary | ICD-10-CM | POA: Diagnosis not present

## 2023-03-31 DIAGNOSIS — F419 Anxiety disorder, unspecified: Secondary | ICD-10-CM | POA: Diagnosis not present

## 2023-03-31 DIAGNOSIS — S72002D Fracture of unspecified part of neck of left femur, subsequent encounter for closed fracture with routine healing: Secondary | ICD-10-CM | POA: Diagnosis not present

## 2023-03-31 DIAGNOSIS — F03B3 Unspecified dementia, moderate, with mood disturbance: Secondary | ICD-10-CM | POA: Diagnosis not present

## 2023-03-31 DIAGNOSIS — S52502D Unspecified fracture of the lower end of left radius, subsequent encounter for closed fracture with routine healing: Secondary | ICD-10-CM | POA: Diagnosis not present

## 2023-03-31 DIAGNOSIS — Z4789 Encounter for other orthopedic aftercare: Secondary | ICD-10-CM | POA: Diagnosis not present

## 2023-03-31 DIAGNOSIS — F32A Depression, unspecified: Secondary | ICD-10-CM | POA: Diagnosis not present

## 2023-03-31 DIAGNOSIS — F03918 Unspecified dementia, unspecified severity, with other behavioral disturbance: Secondary | ICD-10-CM | POA: Diagnosis not present

## 2023-03-31 DIAGNOSIS — Z7901 Long term (current) use of anticoagulants: Secondary | ICD-10-CM | POA: Diagnosis not present

## 2023-04-03 ENCOUNTER — Other Ambulatory Visit: Payer: Self-pay

## 2023-04-03 ENCOUNTER — Encounter: Payer: Self-pay | Admitting: Physician Assistant

## 2023-04-03 ENCOUNTER — Ambulatory Visit (INDEPENDENT_AMBULATORY_CARE_PROVIDER_SITE_OTHER): Payer: Medicare Other | Admitting: Physician Assistant

## 2023-04-03 DIAGNOSIS — S72042A Displaced fracture of base of neck of left femur, initial encounter for closed fracture: Secondary | ICD-10-CM | POA: Diagnosis not present

## 2023-04-03 DIAGNOSIS — G40909 Epilepsy, unspecified, not intractable, without status epilepticus: Secondary | ICD-10-CM | POA: Diagnosis not present

## 2023-04-03 DIAGNOSIS — M25532 Pain in left wrist: Secondary | ICD-10-CM

## 2023-04-03 DIAGNOSIS — S52502A Unspecified fracture of the lower end of left radius, initial encounter for closed fracture: Secondary | ICD-10-CM | POA: Diagnosis not present

## 2023-04-03 DIAGNOSIS — S72002A Fracture of unspecified part of neck of left femur, initial encounter for closed fracture: Secondary | ICD-10-CM

## 2023-04-03 DIAGNOSIS — Z7901 Long term (current) use of anticoagulants: Secondary | ICD-10-CM | POA: Diagnosis not present

## 2023-04-03 DIAGNOSIS — N390 Urinary tract infection, site not specified: Secondary | ICD-10-CM | POA: Diagnosis not present

## 2023-04-03 DIAGNOSIS — F03918 Unspecified dementia, unspecified severity, with other behavioral disturbance: Secondary | ICD-10-CM | POA: Diagnosis not present

## 2023-04-03 NOTE — Progress Notes (Signed)
Office Visit Note   Patient: Alicia [name]           Date of Birth: 1934-03-01           MRN: [id number] Visit Date: 04/03/2023              Requested by: Huel Cote, MD [address] [place],  [place] [address] PCP: No primary care provider on file.   Assessment & Plan: Visit Diagnoses: No diagnosis found.  Plan: Patient is now 15 days status post percutaneous pinning of her left hip by Dr.  Steward Drone as well as close manipulation and cast application to her left distal radius fracture.  She presents with her daughter today.  They were to follow-up in osteoporosis clinic because of her fragility fractures however the daughter was under the impression that this was her postop visit and was not interested in discussing osteoporosis treatment at this time.  Daughter was also concerned that her mother's been oversedated at the nursing facility.  We did take x-rays and remove her surgical staples from her left hip.  Wound look great.  I do have concerns on the x-ray of compression of the fracture on the hip.  Wrist looks stable.  She is neurovascularly intact.  Her compartments are soft and nontender.  I did send Dr. Steward Drone a message to review the x-rays may follow-up with me as needed Follow-Up Instructions: No follow-ups on file.   Orders:  No orders of the defined types were placed in this encounter.  No orders of the defined types were placed in this encounter.     Procedures: No procedures performed   Clinical Data: No additional findings.   Subjective: No chief complaint on file.   HPI pleasant lady with a history of dementia with previously ambulating unassisted at an assisted care facility.  Unfortunately he  Review of Systems  All other systems reviewed and are negative.   Objective: Vital Signs: There were no vitals taken for this visit.  Physical Exam  Ortho Exam Examination of her left hip she has well-healed surgical incisions compartments  are soft and nontender she is neurovascularly intact no erythema or signs of infection.  Left wrist cast is fitting appropriately she has good capillary refill she neurovascular intact No specialty comments available.  Imaging: No results found.   PMFS History: Patient Active Problem List   Diagnosis Date Noted  . Closed fracture of left distal radius 03/19/2023  . Dementia without behavioral disturbance (HCC) 03/18/2023  . Anxiety and depression 03/18/2023  . Hyponatremia 03/18/2023  . Hypokalemia 03/18/2023  . Left wrist fracture 03/18/2023  . Closed fracture of left hip (HCC) 03/18/2023  . Leukocytosis 03/18/2023  . Senile purpura (HCC) 10/25/2019  . Essential hypertension 09/09/2015  . Seizure disorder (HCC) 07/25/2011  . Osteoporosis 07/25/2011  . Vitamin D deficiency 07/25/2011   Past Medical History:  Diagnosis Date  . Eye exam abnormal 11/21/2014   Dr.Hecker-glaucoma suspect, cataracts, hemorrage  . GERD (gastroesophageal reflux disease)   . Hx of migraines   . Hyperlipidemia   . Macular degeneration   . Osteoporosis    DEXA 03/2010 T-3.2 R hip; declines meds  . PUD (peptic ulcer disease) 04/25/1978  . Restless leg syndrome   . Seizure disorder (HCC) 04/26/1995   evaluated by [facility] in past  . Tinnitus 05/26/2009   DR BATES--related to change in med/generic.  Resolved  . Vitamin D deficiency     Family History  Problem Relation  Age of Onset  . Heart disease Mother   . Dementia Father   . Alzheimer's disease Sister   . Thyroid disease Sister        removed, thinks benign  . Heart disease Brother        15's  . Seizures Daughter 60  . Diabetes Neg Hx   . Cancer Neg Hx     Past Surgical History:  Procedure Laterality Date  . CATARACT EXTRACTION Bilateral 11/2014, 01/2015   Dr. Elmer Picker  . CLOSED REDUCTION WRIST FRACTURE Left 03/19/2023   Procedure: CLOSED REDUCTION LEFT WRIST;  Surgeon: Huel Cote, MD;  Location: [facility] OR;  Service: Orthopedics;   Laterality: Left;  . HIP PINNING,CANNULATED Left 03/19/2023   Procedure: PERCUTANEOUS PINNING OF LEFT HIP;  Surgeon: Huel Cote, MD;  Location: [facility] OR;  Service: Orthopedics;  Laterality: Left;  . TONSILLECTOMY    . TUBAL LIGATION     Social History   Occupational History  . Occupation: retired (from Community education officer)  Tobacco Use  . Smoking status: Never  . Smokeless tobacco: Never  Vaping Use  . Vaping status: Never Used  Substance and Sexual Activity  . Alcohol use: No  . Drug use: No  . Sexual activity: Not Currently

## 2023-04-04 DIAGNOSIS — Z4789 Encounter for other orthopedic aftercare: Secondary | ICD-10-CM | POA: Diagnosis not present

## 2023-04-04 DIAGNOSIS — F419 Anxiety disorder, unspecified: Secondary | ICD-10-CM | POA: Diagnosis not present

## 2023-04-04 DIAGNOSIS — F32A Depression, unspecified: Secondary | ICD-10-CM | POA: Diagnosis not present

## 2023-04-04 DIAGNOSIS — F03B3 Unspecified dementia, moderate, with mood disturbance: Secondary | ICD-10-CM | POA: Diagnosis not present

## 2023-04-04 DIAGNOSIS — S52502D Unspecified fracture of the lower end of left radius, subsequent encounter for closed fracture with routine healing: Secondary | ICD-10-CM | POA: Diagnosis not present

## 2023-04-04 DIAGNOSIS — S72002D Fracture of unspecified part of neck of left femur, subsequent encounter for closed fracture with routine healing: Secondary | ICD-10-CM | POA: Diagnosis not present

## 2023-04-04 DIAGNOSIS — M6281 Muscle weakness (generalized): Secondary | ICD-10-CM | POA: Diagnosis not present

## 2023-04-04 DIAGNOSIS — R2689 Other abnormalities of gait and mobility: Secondary | ICD-10-CM | POA: Diagnosis not present

## 2023-04-05 DIAGNOSIS — G40909 Epilepsy, unspecified, not intractable, without status epilepticus: Secondary | ICD-10-CM | POA: Diagnosis not present

## 2023-04-05 DIAGNOSIS — Z7901 Long term (current) use of anticoagulants: Secondary | ICD-10-CM | POA: Diagnosis not present

## 2023-04-05 DIAGNOSIS — S52502A Unspecified fracture of the lower end of left radius, initial encounter for closed fracture: Secondary | ICD-10-CM | POA: Diagnosis not present

## 2023-04-05 DIAGNOSIS — F03918 Unspecified dementia, unspecified severity, with other behavioral disturbance: Secondary | ICD-10-CM | POA: Diagnosis not present

## 2023-04-05 DIAGNOSIS — N390 Urinary tract infection, site not specified: Secondary | ICD-10-CM | POA: Diagnosis not present

## 2023-04-05 DIAGNOSIS — S72042A Displaced fracture of base of neck of left femur, initial encounter for closed fracture: Secondary | ICD-10-CM | POA: Diagnosis not present

## 2023-04-07 DIAGNOSIS — F419 Anxiety disorder, unspecified: Secondary | ICD-10-CM | POA: Diagnosis not present

## 2023-04-07 DIAGNOSIS — F03B3 Unspecified dementia, moderate, with mood disturbance: Secondary | ICD-10-CM | POA: Diagnosis not present

## 2023-04-07 DIAGNOSIS — R2689 Other abnormalities of gait and mobility: Secondary | ICD-10-CM | POA: Diagnosis not present

## 2023-04-07 DIAGNOSIS — S52502D Unspecified fracture of the lower end of left radius, subsequent encounter for closed fracture with routine healing: Secondary | ICD-10-CM | POA: Diagnosis not present

## 2023-04-07 DIAGNOSIS — Z4789 Encounter for other orthopedic aftercare: Secondary | ICD-10-CM | POA: Diagnosis not present

## 2023-04-07 DIAGNOSIS — M6281 Muscle weakness (generalized): Secondary | ICD-10-CM | POA: Diagnosis not present

## 2023-04-07 DIAGNOSIS — S72002D Fracture of unspecified part of neck of left femur, subsequent encounter for closed fracture with routine healing: Secondary | ICD-10-CM | POA: Diagnosis not present

## 2023-04-07 DIAGNOSIS — F32A Depression, unspecified: Secondary | ICD-10-CM | POA: Diagnosis not present

## 2023-04-11 DIAGNOSIS — M6281 Muscle weakness (generalized): Secondary | ICD-10-CM | POA: Diagnosis not present

## 2023-04-11 DIAGNOSIS — R2689 Other abnormalities of gait and mobility: Secondary | ICD-10-CM | POA: Diagnosis not present

## 2023-04-11 DIAGNOSIS — S72002D Fracture of unspecified part of neck of left femur, subsequent encounter for closed fracture with routine healing: Secondary | ICD-10-CM | POA: Diagnosis not present

## 2023-04-11 DIAGNOSIS — S52502D Unspecified fracture of the lower end of left radius, subsequent encounter for closed fracture with routine healing: Secondary | ICD-10-CM | POA: Diagnosis not present

## 2023-04-11 DIAGNOSIS — Z4789 Encounter for other orthopedic aftercare: Secondary | ICD-10-CM | POA: Diagnosis not present

## 2023-04-11 DIAGNOSIS — F03B3 Unspecified dementia, moderate, with mood disturbance: Secondary | ICD-10-CM | POA: Diagnosis not present

## 2023-04-11 DIAGNOSIS — F32A Depression, unspecified: Secondary | ICD-10-CM | POA: Diagnosis not present

## 2023-04-11 DIAGNOSIS — F419 Anxiety disorder, unspecified: Secondary | ICD-10-CM | POA: Diagnosis not present

## 2023-04-14 DIAGNOSIS — Z7901 Long term (current) use of anticoagulants: Secondary | ICD-10-CM | POA: Diagnosis not present

## 2023-04-14 DIAGNOSIS — S52502D Unspecified fracture of the lower end of left radius, subsequent encounter for closed fracture with routine healing: Secondary | ICD-10-CM | POA: Diagnosis not present

## 2023-04-14 DIAGNOSIS — F03B3 Unspecified dementia, moderate, with mood disturbance: Secondary | ICD-10-CM | POA: Diagnosis not present

## 2023-04-14 DIAGNOSIS — G40909 Epilepsy, unspecified, not intractable, without status epilepticus: Secondary | ICD-10-CM | POA: Diagnosis not present

## 2023-04-14 DIAGNOSIS — S72042A Displaced fracture of base of neck of left femur, initial encounter for closed fracture: Secondary | ICD-10-CM | POA: Diagnosis not present

## 2023-04-14 DIAGNOSIS — Z4789 Encounter for other orthopedic aftercare: Secondary | ICD-10-CM | POA: Diagnosis not present

## 2023-04-14 DIAGNOSIS — S52502A Unspecified fracture of the lower end of left radius, initial encounter for closed fracture: Secondary | ICD-10-CM | POA: Diagnosis not present

## 2023-04-14 DIAGNOSIS — S72002D Fracture of unspecified part of neck of left femur, subsequent encounter for closed fracture with routine healing: Secondary | ICD-10-CM | POA: Diagnosis not present

## 2023-04-14 DIAGNOSIS — N390 Urinary tract infection, site not specified: Secondary | ICD-10-CM | POA: Diagnosis not present

## 2023-04-14 DIAGNOSIS — F03918 Unspecified dementia, unspecified severity, with other behavioral disturbance: Secondary | ICD-10-CM | POA: Diagnosis not present

## 2023-04-14 DIAGNOSIS — F32A Depression, unspecified: Secondary | ICD-10-CM | POA: Diagnosis not present

## 2023-04-14 DIAGNOSIS — R2689 Other abnormalities of gait and mobility: Secondary | ICD-10-CM | POA: Diagnosis not present

## 2023-04-14 DIAGNOSIS — M6281 Muscle weakness (generalized): Secondary | ICD-10-CM | POA: Diagnosis not present

## 2023-04-14 DIAGNOSIS — F419 Anxiety disorder, unspecified: Secondary | ICD-10-CM | POA: Diagnosis not present

## 2023-04-18 DIAGNOSIS — R2689 Other abnormalities of gait and mobility: Secondary | ICD-10-CM | POA: Diagnosis not present

## 2023-04-18 DIAGNOSIS — F03B3 Unspecified dementia, moderate, with mood disturbance: Secondary | ICD-10-CM | POA: Diagnosis not present

## 2023-04-18 DIAGNOSIS — S72002D Fracture of unspecified part of neck of left femur, subsequent encounter for closed fracture with routine healing: Secondary | ICD-10-CM | POA: Diagnosis not present

## 2023-04-18 DIAGNOSIS — M6281 Muscle weakness (generalized): Secondary | ICD-10-CM | POA: Diagnosis not present

## 2023-04-18 DIAGNOSIS — F32A Depression, unspecified: Secondary | ICD-10-CM | POA: Diagnosis not present

## 2023-04-18 DIAGNOSIS — Z4789 Encounter for other orthopedic aftercare: Secondary | ICD-10-CM | POA: Diagnosis not present

## 2023-04-18 DIAGNOSIS — S52502D Unspecified fracture of the lower end of left radius, subsequent encounter for closed fracture with routine healing: Secondary | ICD-10-CM | POA: Diagnosis not present

## 2023-04-18 DIAGNOSIS — F419 Anxiety disorder, unspecified: Secondary | ICD-10-CM | POA: Diagnosis not present

## 2023-04-20 DIAGNOSIS — S52502A Unspecified fracture of the lower end of left radius, initial encounter for closed fracture: Secondary | ICD-10-CM | POA: Diagnosis not present

## 2023-04-20 DIAGNOSIS — Z7901 Long term (current) use of anticoagulants: Secondary | ICD-10-CM | POA: Diagnosis not present

## 2023-04-20 DIAGNOSIS — S72042A Displaced fracture of base of neck of left femur, initial encounter for closed fracture: Secondary | ICD-10-CM | POA: Diagnosis not present

## 2023-04-20 DIAGNOSIS — F03918 Unspecified dementia, unspecified severity, with other behavioral disturbance: Secondary | ICD-10-CM | POA: Diagnosis not present

## 2023-04-20 DIAGNOSIS — N182 Chronic kidney disease, stage 2 (mild): Secondary | ICD-10-CM | POA: Diagnosis not present

## 2023-04-20 DIAGNOSIS — I130 Hypertensive heart and chronic kidney disease with heart failure and stage 1 through stage 4 chronic kidney disease, or unspecified chronic kidney disease: Secondary | ICD-10-CM | POA: Diagnosis not present

## 2023-04-20 DIAGNOSIS — N390 Urinary tract infection, site not specified: Secondary | ICD-10-CM | POA: Diagnosis not present

## 2023-04-20 DIAGNOSIS — G40909 Epilepsy, unspecified, not intractable, without status epilepticus: Secondary | ICD-10-CM | POA: Diagnosis not present

## 2023-04-20 DIAGNOSIS — E871 Hypo-osmolality and hyponatremia: Secondary | ICD-10-CM | POA: Diagnosis not present

## 2023-04-25 DIAGNOSIS — R2689 Other abnormalities of gait and mobility: Secondary | ICD-10-CM | POA: Diagnosis not present

## 2023-04-25 DIAGNOSIS — M6281 Muscle weakness (generalized): Secondary | ICD-10-CM | POA: Diagnosis not present

## 2023-04-25 DIAGNOSIS — Z4789 Encounter for other orthopedic aftercare: Secondary | ICD-10-CM | POA: Diagnosis not present

## 2023-04-25 DIAGNOSIS — S52502D Unspecified fracture of the lower end of left radius, subsequent encounter for closed fracture with routine healing: Secondary | ICD-10-CM | POA: Diagnosis not present

## 2023-04-25 DIAGNOSIS — F419 Anxiety disorder, unspecified: Secondary | ICD-10-CM | POA: Diagnosis not present

## 2023-04-25 DIAGNOSIS — F03B3 Unspecified dementia, moderate, with mood disturbance: Secondary | ICD-10-CM | POA: Diagnosis not present

## 2023-04-25 DIAGNOSIS — F32A Depression, unspecified: Secondary | ICD-10-CM | POA: Diagnosis not present

## 2023-04-25 DIAGNOSIS — S72002D Fracture of unspecified part of neck of left femur, subsequent encounter for closed fracture with routine healing: Secondary | ICD-10-CM | POA: Diagnosis not present

## 2023-04-28 ENCOUNTER — Telehealth (HOSPITAL_BASED_OUTPATIENT_CLINIC_OR_DEPARTMENT_OTHER): Payer: Self-pay | Admitting: Orthopaedic Surgery

## 2023-04-28 NOTE — Telephone Encounter (Signed)
 After verbal discussion with Dr. Steward Drone, returned call to [facility] informing her there is nothing needed to do following the removal of cast and she may remain on NWB on that extremity until her f/u appt with Dr. Steward Drone 05/03/23

## 2023-05-04 ENCOUNTER — Ambulatory Visit (HOSPITAL_BASED_OUTPATIENT_CLINIC_OR_DEPARTMENT_OTHER): Payer: Medicare Other

## 2023-05-04 ENCOUNTER — Ambulatory Visit (INDEPENDENT_AMBULATORY_CARE_PROVIDER_SITE_OTHER): Payer: Medicare Other | Admitting: Orthopaedic Surgery

## 2023-05-04 ENCOUNTER — Other Ambulatory Visit (HOSPITAL_BASED_OUTPATIENT_CLINIC_OR_DEPARTMENT_OTHER): Payer: Self-pay | Admitting: Orthopaedic Surgery

## 2023-05-04 DIAGNOSIS — S72002A Fracture of unspecified part of neck of left femur, initial encounter for closed fracture: Secondary | ICD-10-CM

## 2023-05-04 DIAGNOSIS — M25532 Pain in left wrist: Secondary | ICD-10-CM

## 2023-05-04 NOTE — Progress Notes (Signed)
 Chief Complaint: Left hip, left wrist fracture     History of Present Illness:    Alicia [name] is a 89 y.o. female presents today with ongoing follow-up and 6-week visit status post left hip percutaneous pinning and left wrist closed reduction with cast placement.  She did recently slip out of the left wrist cast as she has been losing weight per her daughter.  She unfortunately has not been able to ambulate significantly and is since transition to palliative care.  She denies any significant pain with gentle range of motion of the left hip    PMH/PSH/Family History/Social History/Meds/Allergies:    Past Medical History:  Diagnosis Date   Eye exam abnormal 11/21/2014   Dr.Hecker-glaucoma suspect, cataracts, hemorrage   GERD (gastroesophageal reflux disease)    Hx of migraines    Hyperlipidemia    Macular degeneration    Osteoporosis    DEXA 03/2010 T-3.2 R hip; declines meds   PUD (peptic ulcer disease) 04/25/1978   Restless leg syndrome    Seizure disorder (HCC) 04/26/1995   evaluated by [facility] in past   Tinnitus 05/26/2009   DR BATES--related to change in med/generic.  Resolved   Vitamin D  deficiency    Past Surgical History:  Procedure Laterality Date   CATARACT EXTRACTION Bilateral 11/2014, 01/2015   Dr. Cleatus   CLOSED REDUCTION WRIST FRACTURE Left 03/19/2023   Procedure: CLOSED REDUCTION LEFT WRIST;  Surgeon: Genelle Standing, MD;  Location: [facility] OR;  Service: Orthopedics;  Laterality: Left;   HIP PINNING,CANNULATED Left 03/19/2023   Procedure: PERCUTANEOUS PINNING OF LEFT HIP;  Surgeon: Genelle Standing, MD;  Location: [facility] OR;  Service: Orthopedics;  Laterality: Left;   TONSILLECTOMY     TUBAL LIGATION     Social History   Socioeconomic History   Marital status: Widowed    Spouse name: Not on file   Number of children: 2   Years of education: Not on file   Highest education level: 12th grade  Occupational History   Occupation: retired (from community education officer)   Tobacco Use   Smoking status: Never   Smokeless tobacco: Never  Vaping Use   Vaping status: Never Used  Substance and Sexual Activity   Alcohol use: No   Drug use: No   Sexual activity: Not Currently  Other Topics Concern   Not on file  Social History Narrative   Sold townhome, moved into [place]. Has pull cords in her rooms if she falls (no longer wears bracelet).   Widowed 2009.  1 daughter in [place] (has 3 kids), 1 daughter in [place]  (2 children). 5 grandchildren total.  1 Great-grandchild (in [place])   Caffeine- coffee, 1 daily      Updated 10/2020   Social Drivers of Health   Financial Resource Strain: Low Risk  (07/08/2021)   Overall Financial Resource Strain (CARDIA)    Difficulty of Paying Living Expenses: Not very hard  Food Insecurity: No Food Insecurity (03/19/2023)   Hunger Vital Sign    Worried About Running Out of Food in the Last Year: Never true    Ran Out of Food in the Last Year: Never true  Transportation Needs: No Transportation Needs (03/19/2023)   PRAPARE - Administrator, Civil Service (Medical): No    Lack of Transportation (Non-Medical): No  Physical Activity: Insufficiently Active (07/08/2021)   Exercise Vital Sign    Days of Exercise per Week: 3 days    Minutes of Exercise per Session: 30 min  Stress: No Stress Concern Present (07/08/2021)   [employer] of Occupational Health - Occupational Stress Questionnaire    Feeling of Stress : Not at all  Social Connections: Unknown (07/08/2021)   Social Connection and Isolation Panel [NHANES]    Frequency of Communication with Friends and Family: Once a week    Frequency of Social Gatherings with Friends and Family: More than three times a week    Attends Religious Services: Patient declined    Database Administrator or Organizations: No    Attends Engineer, Structural: Not on file    Marital Status: Widowed   Family History  Problem Relation Age of Onset   Heart disease  Mother    Dementia Father    Alzheimer's disease Sister    Thyroid  disease Sister        removed, thinks benign   Heart disease Brother        80's   Seizures Daughter 39   Diabetes Neg Hx    Cancer Neg Hx    Allergies  Allergen Reactions   Sulfa Antibiotics Rash   Current Outpatient Medications  Medication Sig Dispense Refill   aspirin  EC 325 MG tablet Take 1 tablet (325 mg total) by mouth daily with breakfast.     levETIRAcetam  (KEPPRA ) 500 MG tablet TAKE 1 TABLET BY MOUTH TWICE A DAY *NDC [id number]* 180 tablet 0   methocarbamol  (ROBAXIN ) 500 MG tablet Take 1 tablet (500 mg total) by mouth every 6 (six) hours as needed for muscle spasms. 30 tablet 0   mirabegron  ER (MYRBETRIQ ) 25 MG TB24 tablet Take 25 mg by mouth daily.     polyethylene glycol (MIRALAX ) 17 g packet Take 17 g by mouth daily as needed for mild constipation. 14 each 0   QUEtiapine  (SEROQUEL ) 25 MG tablet Take 1 tablet (25 mg total) by mouth at bedtime. 10 tablet 0   senna-docusate (SENOKOT-S) 8.6-50 MG tablet Take 1 tablet by mouth 2 (two) times daily. 30 tablet 0   sertraline  (ZOLOFT ) 50 MG tablet Take 50 mg by mouth daily.     Vitamin D , Cholecalciferol , 25 MCG (1000 UT) CAPS Take 1 capsule by mouth daily.     No current facility-administered medications for this visit.   No results found.  Review of Systems:   A ROS was performed including pertinent positives and negatives as documented in the HPI.  Physical Exam :   Constitutional: NAD and appears stated age Neurological: Alert and oriented Psych: Appropriate affect and cooperative There were no vitals taken for this visit.   Comprehensive Musculoskeletal Exam:    Left hip with no pain with 20 degrees internal/external rotation of the hip sitting in a wheelchair.  Left wrist she is able to form a composite fist with intact distal neurosensory exam   Imaging:   Xray (3 views left wrist, 3 views left hip): Marinda post left hip percutaneous screw  placement without evidence of complication, left wrist healing     I personally reviewed and interpreted the radiographs.   Assessment and Plan:   89 y.o. female with left distal radius fracture which appears to be healed on x-ray today.  At this time she may use her left wrist as tolerated.  She does appear to have left hip healing as well in the setting of percutaneous screw placement.  This time she may continue to be activity and weightbearing as tolerated on this although given her significant muscle atrophy I do not believe unfortunately  would be likely that she would be able to progress out of the wheelchair.  I have explained this to her daughter and they in conjunction decided to pursue more of a comfort care model.  I will plan to see her back as needed   I personally saw and evaluated the patient, and participated in the management and treatment plan.  Elspeth Parker, MD Attending Physician, Orthopedic Surgery  This document was dictated using Dragon voice recognition software. A reasonable attempt at proof reading has been made to minimize errors.

## 2023-06-24 DEATH — deceased

## 2024-02-26 ENCOUNTER — Encounter: Payer: Self-pay | Admitting: Radiology
